# Patient Record
Sex: Female | Born: 1975 | Race: White | Hispanic: No | Marital: Married | Smoking: Never smoker
Health system: Southern US, Community
[De-identification: ages and names within clinical notes are randomized; demographics above are authoritative.]

## PROBLEM LIST (undated history)

## (undated) ENCOUNTER — Emergency Department (HOSPITAL_COMMUNITY): Admission: EM | Discharge status: Against Medical Advice | Payer: Self-pay

## (undated) DIAGNOSIS — I639 Cerebral infarction, unspecified: Secondary | ICD-10-CM

## (undated) DIAGNOSIS — J339 Nasal polyp, unspecified: Secondary | ICD-10-CM

## (undated) DIAGNOSIS — M792 Neuralgia and neuritis, unspecified: Secondary | ICD-10-CM

## (undated) DIAGNOSIS — R112 Nausea with vomiting, unspecified: Secondary | ICD-10-CM

## (undated) DIAGNOSIS — M1990 Unspecified osteoarthritis, unspecified site: Secondary | ICD-10-CM

## (undated) DIAGNOSIS — Z9889 Other specified postprocedural states: Secondary | ICD-10-CM

## (undated) DIAGNOSIS — K8020 Calculus of gallbladder without cholecystitis without obstruction: Secondary | ICD-10-CM

## (undated) DIAGNOSIS — K900 Celiac disease: Secondary | ICD-10-CM

## (undated) DIAGNOSIS — K219 Gastro-esophageal reflux disease without esophagitis: Secondary | ICD-10-CM

## (undated) DIAGNOSIS — G43909 Migraine, unspecified, not intractable, without status migrainosus: Secondary | ICD-10-CM

## (undated) DIAGNOSIS — M797 Fibromyalgia: Secondary | ICD-10-CM

## (undated) DIAGNOSIS — Q211 Atrial septal defect: Secondary | ICD-10-CM

## (undated) DIAGNOSIS — J302 Other seasonal allergic rhinitis: Secondary | ICD-10-CM

## (undated) HISTORY — PX: KNEE SURGERY: SHX244

## (undated) HISTORY — DX: Nasal polyp, unspecified: J33.9

## (undated) HISTORY — DX: Migraine, unspecified, not intractable, without status migrainosus: G43.909

## (undated) HISTORY — PX: WISDOM TOOTH EXTRACTION: SHX21

## (undated) HISTORY — DX: Atrial septal defect: Q21.1

## (undated) HISTORY — DX: Cerebral infarction, unspecified: I63.9

---

## 2000-11-18 ENCOUNTER — Encounter: Payer: Self-pay | Admitting: Emergency Medicine

## 2000-11-18 ENCOUNTER — Emergency Department (HOSPITAL_COMMUNITY): Admission: EM | Admit: 2000-11-18 | Discharge: 2000-11-18 | Payer: Self-pay | Admitting: Emergency Medicine

## 2001-02-27 ENCOUNTER — Encounter (INDEPENDENT_AMBULATORY_CARE_PROVIDER_SITE_OTHER): Payer: Self-pay | Admitting: Specialist

## 2001-02-27 ENCOUNTER — Inpatient Hospital Stay (HOSPITAL_COMMUNITY): Admission: AD | Admit: 2001-02-27 | Discharge: 2001-02-27 | Payer: Self-pay | Admitting: Gynecology

## 2001-02-27 ENCOUNTER — Encounter: Payer: Self-pay | Admitting: Gynecology

## 2001-10-04 ENCOUNTER — Encounter: Payer: Self-pay | Admitting: Obstetrics & Gynecology

## 2001-10-04 ENCOUNTER — Ambulatory Visit (HOSPITAL_COMMUNITY): Admission: RE | Admit: 2001-10-04 | Discharge: 2001-10-04 | Payer: Self-pay | Admitting: Obstetrics & Gynecology

## 2001-11-29 ENCOUNTER — Inpatient Hospital Stay (HOSPITAL_COMMUNITY): Admission: AD | Admit: 2001-11-29 | Discharge: 2001-11-29 | Payer: Self-pay | Admitting: Obstetrics & Gynecology

## 2001-12-13 ENCOUNTER — Ambulatory Visit (HOSPITAL_COMMUNITY): Admission: RE | Admit: 2001-12-13 | Discharge: 2001-12-13 | Payer: Self-pay | Admitting: Obstetrics & Gynecology

## 2001-12-13 ENCOUNTER — Encounter: Payer: Self-pay | Admitting: Obstetrics & Gynecology

## 2002-01-03 ENCOUNTER — Inpatient Hospital Stay (HOSPITAL_COMMUNITY): Admission: AD | Admit: 2002-01-03 | Discharge: 2002-01-09 | Payer: Self-pay | Admitting: Obstetrics & Gynecology

## 2002-02-20 ENCOUNTER — Inpatient Hospital Stay (HOSPITAL_COMMUNITY): Admission: AD | Admit: 2002-02-20 | Discharge: 2002-02-23 | Payer: Self-pay | Admitting: Obstetrics & Gynecology

## 2003-10-02 ENCOUNTER — Inpatient Hospital Stay (HOSPITAL_COMMUNITY): Admission: AD | Admit: 2003-10-02 | Discharge: 2003-10-02 | Payer: Self-pay | Admitting: Obstetrics

## 2003-12-13 ENCOUNTER — Inpatient Hospital Stay (HOSPITAL_COMMUNITY): Admission: AD | Admit: 2003-12-13 | Discharge: 2003-12-16 | Payer: Self-pay | Admitting: Obstetrics & Gynecology

## 2004-04-13 ENCOUNTER — Emergency Department (HOSPITAL_COMMUNITY): Admission: EM | Admit: 2004-04-13 | Discharge: 2004-04-14 | Payer: Self-pay | Admitting: Emergency Medicine

## 2005-12-26 ENCOUNTER — Emergency Department (HOSPITAL_COMMUNITY): Admission: EM | Admit: 2005-12-26 | Discharge: 2005-12-26 | Payer: Self-pay | Admitting: Emergency Medicine

## 2006-02-02 DIAGNOSIS — J189 Pneumonia, unspecified organism: Secondary | ICD-10-CM

## 2006-02-02 HISTORY — DX: Pneumonia, unspecified organism: J18.9

## 2006-03-24 ENCOUNTER — Inpatient Hospital Stay (HOSPITAL_COMMUNITY): Admission: AD | Admit: 2006-03-24 | Discharge: 2006-03-24 | Payer: Self-pay | Admitting: Obstetrics & Gynecology

## 2006-04-19 ENCOUNTER — Inpatient Hospital Stay (HOSPITAL_COMMUNITY): Admission: AD | Admit: 2006-04-19 | Discharge: 2006-04-22 | Payer: Self-pay | Admitting: Obstetrics

## 2006-05-17 ENCOUNTER — Inpatient Hospital Stay (HOSPITAL_COMMUNITY): Admission: AD | Admit: 2006-05-17 | Discharge: 2006-05-17 | Payer: Self-pay | Admitting: Obstetrics & Gynecology

## 2006-06-02 ENCOUNTER — Inpatient Hospital Stay (HOSPITAL_COMMUNITY): Admission: RE | Admit: 2006-06-02 | Discharge: 2006-06-04 | Payer: Self-pay | Admitting: Obstetrics & Gynecology

## 2006-06-07 ENCOUNTER — Encounter: Admission: RE | Admit: 2006-06-07 | Discharge: 2006-07-07 | Payer: Self-pay | Admitting: Obstetrics & Gynecology

## 2006-07-08 ENCOUNTER — Encounter: Admission: RE | Admit: 2006-07-08 | Discharge: 2006-08-06 | Payer: Self-pay | Admitting: Obstetrics & Gynecology

## 2006-08-07 ENCOUNTER — Encounter: Admission: RE | Admit: 2006-08-07 | Discharge: 2006-08-20 | Payer: Self-pay | Admitting: Obstetrics & Gynecology

## 2006-12-17 ENCOUNTER — Emergency Department (HOSPITAL_COMMUNITY): Admission: EM | Admit: 2006-12-17 | Discharge: 2006-12-17 | Payer: Self-pay | Admitting: Emergency Medicine

## 2007-01-04 ENCOUNTER — Ambulatory Visit (HOSPITAL_COMMUNITY): Admission: RE | Admit: 2007-01-04 | Discharge: 2007-01-04 | Payer: Self-pay | Admitting: Obstetrics & Gynecology

## 2008-06-13 ENCOUNTER — Ambulatory Visit (HOSPITAL_COMMUNITY): Admission: RE | Admit: 2008-06-13 | Discharge: 2008-06-13 | Payer: Self-pay | Admitting: Obstetrics & Gynecology

## 2008-06-21 ENCOUNTER — Ambulatory Visit (HOSPITAL_COMMUNITY): Admission: RE | Admit: 2008-06-21 | Discharge: 2008-06-21 | Payer: Self-pay | Admitting: Obstetrics & Gynecology

## 2008-06-21 ENCOUNTER — Encounter: Payer: Self-pay | Admitting: Obstetrics & Gynecology

## 2009-03-04 ENCOUNTER — Encounter: Admission: RE | Admit: 2009-03-04 | Discharge: 2009-03-04 | Payer: Self-pay | Admitting: Family Medicine

## 2009-04-02 DIAGNOSIS — I639 Cerebral infarction, unspecified: Secondary | ICD-10-CM

## 2009-04-02 DIAGNOSIS — Q211 Atrial septal defect: Secondary | ICD-10-CM

## 2009-04-02 DIAGNOSIS — Q2112 Patent foramen ovale: Secondary | ICD-10-CM

## 2009-04-02 HISTORY — DX: Atrial septal defect: Q21.1

## 2009-04-02 HISTORY — DX: Patent foramen ovale: Q21.12

## 2009-04-02 HISTORY — DX: Cerebral infarction, unspecified: I63.9

## 2009-04-14 ENCOUNTER — Inpatient Hospital Stay (HOSPITAL_COMMUNITY): Admission: EM | Admit: 2009-04-14 | Discharge: 2009-04-16 | Payer: Self-pay | Admitting: Emergency Medicine

## 2009-04-15 ENCOUNTER — Ambulatory Visit: Payer: Self-pay | Admitting: Vascular Surgery

## 2009-04-15 ENCOUNTER — Encounter (INDEPENDENT_AMBULATORY_CARE_PROVIDER_SITE_OTHER): Payer: Self-pay | Admitting: Family Medicine

## 2009-04-24 ENCOUNTER — Ambulatory Visit: Payer: Self-pay | Admitting: Cardiology

## 2009-04-24 ENCOUNTER — Observation Stay (HOSPITAL_COMMUNITY): Admission: EM | Admit: 2009-04-24 | Discharge: 2009-04-25 | Payer: Self-pay | Admitting: Emergency Medicine

## 2009-04-25 ENCOUNTER — Encounter (INDEPENDENT_AMBULATORY_CARE_PROVIDER_SITE_OTHER): Payer: Self-pay | Admitting: Family Medicine

## 2009-05-02 ENCOUNTER — Encounter: Payer: Self-pay | Admitting: Cardiovascular Disease

## 2009-05-02 DIAGNOSIS — Z87898 Personal history of other specified conditions: Secondary | ICD-10-CM

## 2009-05-02 DIAGNOSIS — G43909 Migraine, unspecified, not intractable, without status migrainosus: Secondary | ICD-10-CM | POA: Insufficient documentation

## 2009-05-02 DIAGNOSIS — E786 Lipoprotein deficiency: Secondary | ICD-10-CM | POA: Insufficient documentation

## 2009-05-02 DIAGNOSIS — Z8673 Personal history of transient ischemic attack (TIA), and cerebral infarction without residual deficits: Secondary | ICD-10-CM | POA: Insufficient documentation

## 2009-05-02 DIAGNOSIS — I6350 Cerebral infarction due to unspecified occlusion or stenosis of unspecified cerebral artery: Secondary | ICD-10-CM

## 2009-05-03 ENCOUNTER — Ambulatory Visit: Payer: Self-pay | Admitting: Cardiovascular Disease

## 2009-05-03 DIAGNOSIS — Q211 Atrial septal defect: Secondary | ICD-10-CM | POA: Insufficient documentation

## 2009-08-11 ENCOUNTER — Inpatient Hospital Stay (HOSPITAL_COMMUNITY): Admission: EM | Admit: 2009-08-11 | Discharge: 2009-08-13 | Payer: Self-pay | Admitting: Emergency Medicine

## 2009-08-26 ENCOUNTER — Ambulatory Visit: Payer: Self-pay | Admitting: Cardiovascular Disease

## 2009-08-27 ENCOUNTER — Emergency Department (HOSPITAL_COMMUNITY): Admission: EM | Admit: 2009-08-27 | Discharge: 2009-08-27 | Payer: Self-pay | Admitting: Emergency Medicine

## 2009-09-02 ENCOUNTER — Ambulatory Visit: Payer: Self-pay | Admitting: Cardiovascular Disease

## 2009-09-02 HISTORY — PX: PATENT FORAMEN OVALE(PFO) CLOSURE: CATH118300

## 2009-09-02 LAB — CONVERTED CEMR LAB
BUN: 13 mg/dL (ref 6–23)
Basophils Absolute: 0.1 10*3/uL (ref 0.0–0.1)
CO2: 28 meq/L (ref 19–32)
Chloride: 105 meq/L (ref 96–112)
Glucose, Bld: 74 mg/dL (ref 70–99)
INR: 1 (ref 0.8–1.0)
Lymphocytes Relative: 35.6 % (ref 12.0–46.0)
MCHC: 34.2 g/dL (ref 30.0–36.0)
MCV: 92 fL (ref 78.0–100.0)
Neutro Abs: 3.4 10*3/uL (ref 1.4–7.7)
Neutrophils Relative %: 53.3 % (ref 43.0–77.0)
Platelets: 259 10*3/uL (ref 150.0–400.0)
Potassium: 4 meq/L (ref 3.5–5.1)
RDW: 13.1 % (ref 11.5–14.6)
Sodium: 139 meq/L (ref 135–145)
aPTT: 30.3 s — ABNORMAL HIGH (ref 21.7–28.8)

## 2009-09-04 ENCOUNTER — Ambulatory Visit: Payer: Self-pay | Admitting: Cardiovascular Disease

## 2009-09-04 ENCOUNTER — Inpatient Hospital Stay (HOSPITAL_COMMUNITY): Admission: RE | Admit: 2009-09-04 | Discharge: 2009-09-06 | Payer: Self-pay | Admitting: Cardiovascular Disease

## 2009-09-05 ENCOUNTER — Encounter: Payer: Self-pay | Admitting: Cardiovascular Disease

## 2009-09-09 ENCOUNTER — Telehealth: Payer: Self-pay | Admitting: Cardiovascular Disease

## 2009-09-12 ENCOUNTER — Ambulatory Visit: Payer: Self-pay | Admitting: Cardiovascular Disease

## 2009-10-03 ENCOUNTER — Ambulatory Visit: Payer: Self-pay | Admitting: Cardiovascular Disease

## 2009-10-03 ENCOUNTER — Ambulatory Visit (HOSPITAL_COMMUNITY): Admission: RE | Admit: 2009-10-03 | Discharge: 2009-10-03 | Payer: Self-pay | Admitting: Cardiovascular Disease

## 2009-10-03 ENCOUNTER — Ambulatory Visit: Payer: Self-pay

## 2009-10-03 ENCOUNTER — Ambulatory Visit: Payer: Self-pay | Admitting: Cardiology

## 2009-11-05 ENCOUNTER — Encounter: Payer: Self-pay | Admitting: Cardiovascular Disease

## 2009-11-14 ENCOUNTER — Telehealth: Payer: Self-pay | Admitting: Cardiovascular Disease

## 2009-12-05 ENCOUNTER — Encounter: Payer: Self-pay | Admitting: Cardiovascular Disease

## 2009-12-05 ENCOUNTER — Ambulatory Visit: Payer: Self-pay | Admitting: Cardiovascular Disease

## 2010-03-04 NOTE — Letter (Signed)
Summary: ASD CLOSURE  [facility], Main Office  [address]   [place], [place] [address]   Phone: [phone]  Fax: [phone]     08/26/2009 MRN: [id number]  Tonya Andrews [address] [place], [place]  [address]  Dear Ms. Freeze,   You are scheduled for PFO Closure on Wednesday September 04, 2009              with Dr. Excell Seltzer.  Please arrive at the [facility] at 6:30       a.m. on the day of your procedure.  1. DIET     _X___ Nothing to eat or drink after midnight except your medications with a sip of water.  DRINK PLENTY OF WATER THE DAY PRIOR TO PROCEDURE.  2. Come to the [facility] office on Monday September 02, 2009 for lab work.  The lab at [facility] is open from 8:30 a.m. to 2:00 p.m. and 2:30 p.m. to 5:00 p.m.  You do not have to be fasting.  3. MAKE SURE YOU TAKE YOUR PLAVIX.    4. __X__ YOU MAY TAKE ALL of your remaining medications with a small amount of water.      5. Plan for one night stay - bring personal belongings (i.e. toothpaste, toothbrush, etc.)  6. Bring a current list of your medications and current insurance cards.  7. Must have a responsible person to drive you home.   8. Someone must be with you for the first 24 hours after you arrive home.  9. Please wear clothes that are easy to get on and off and wear slip-on shoes.  *Special note: Every effort is made to have your procedure done on time.  Occasionally there are emergencies that present themselves at the hospital that may cause delays.  Please be patient if a delay does occur.  If you have any questions after you get home, please call the office at the number listed above.  Julieta Gutting, RN, BSN

## 2010-03-04 NOTE — Letter (Signed)
Summary: Trip Cancellation Claim Form  Trip Cancellation Claim Form   Imported By: Roderic Ovens 05/14/2009 14:26:16  _____________________________________________________________________  External Attachment:    Type:   Image     Comment:   External Document

## 2010-03-04 NOTE — Discharge Summary (Signed)
  [name], Tonya Andrews                 ACCOUNT NO.:  [id number]  MEDICAL RECORD NO.:  [id number]          PATIENT TYPE:  INP  LOCATION:  2507                         FACILITY:  [facility]  PHYSICIAN:  Veverly Fells. Excell Seltzer, MD  DATE OF BIRTH:  April 08, 1975  DATE OF ADMISSION:  09/04/2009 DATE OF DISCHARGE:  09/06/2009                              DISCHARGE SUMMARY  ADDENDUM: The patient was kept an additional night because of postural tachycardia.  She developed dizziness and significant tachycardia with heart rates in the 120s to 130s with standing the day following transcatheter closure of an atrioseptal communication.  She was given additional intravenous fluids overnight and her symptoms did not fully resolve, but they improved by the morning of August 5.  Her echocardiogram was reviewed and it showed no significant abnormalities. The closure device was in appropriate position.  There was no pericardial effusion.  The patient's orthostatic vital signs on the morning of August 5 were as follows:  Supine heart rate 80, blood pressure 101/63 sitting, heart rate 85, blood pressure 101/61, standing heart rate 96, blood pressure 98/70 and 3 minutes standing, heart rate 104, blood pressure 100/68.  OTHER INSTRUCTIONS:  As per discharge summary dictated August 4.  The patient will be given a prescription for metoprolol 25 mg twice daily as needed for palpitations or chest pain.  She also has developed some rash related to tape exposure and she can take Benadryl as needed for this. I will add her on for followup visit and to be seen next week in the office.     Veverly Fells. Excell Seltzer, MD    MDC/MEDQ  D:  09/06/2009  T:  09/06/2009  Job:  [id number]  cc:   Emeterio Reeve, MD  Electronically Signed by Tonny Bollman MD on 03/04/2010 04:42:21 AM

## 2010-03-04 NOTE — Progress Notes (Signed)
Summary: pt needs referral to rehab  Phone Note From Other Clinic Call back at [phone]   [facility] Reason for Call: Need Referral Information Summary of Call: pt is scheduled for appt on 8/9 @ 9am because she told them Dr. Copper told her to find a place she wanted to go to and he would do a referral for her. they need to hear about this today because pt is due to be seen in the am. Initial call taken by: Omer Jack,  September 09, 2009 10:25 AM  Follow-up for Phone Call        I spoke with Deanna and she needs an order from Dr Excell Seltzer for PT/OT of right arm and hand. Order placed and faxed to [phone]. Follow-up by: Julieta Gutting, RN, BSN,  September 09, 2009 1:42 PM

## 2010-03-04 NOTE — Assessment & Plan Note (Signed)
Summary: rov   Visit Type:  Post-hospital Referring Provider:  Dr Pearlean Brownie Primary Provider:  Dr Paulino Rily  CC:  Stroke.  History of Present Illness: 35 year-old presenting for hospital follow-up evaluation after sustaining a second stroke this year. She was found to have an atrial septal communication after a stroke sustained this Spring. She was evaluated and we decide to treat her medically with aspirin at the time. Unfortunately, she had a second event 2 weeks ago when she developed sudden onset of right arm and hand weakness and tingling. She subsequently developed headache and blurred vision. She has had more frequent episodes since the event. She also reports an episode of right leg tingling and numbness yesterday, but no other symptoms were associated and this has now resolved.   She was evaluated by neurology during her hospitalization, and transcatheter closure of the atrial septal communication was recommended since the event occurred on a background of aspirin. She was also started on clopidogrel at that time and she is tolerating this without side-effects.  She complains of palpitations and associated chest pain, but this is nonexertional. She underwent cardiac cath earlier this year demonstrating no CAD.  Current Medications (verified): 1)  Plavix 75 Mg Tabs (Clopidogrel Bisulfate) .... Take One Tablet By Mouth Daily 2)  Fish Oil 1000 Mg Caps (Omega-3 Fatty Acids) .... Take 1 Capsule By Mouth Once A Day 3)  Topiramate 25 Mg Tabs (Topiramate) .... Take 1 Tablet By Mouth Once A Day 4)  Prilosec 20 Mg Cpdr (Omeprazole) .... Take 1 Capsule By Mouth Once A Day  Allergies: 1)  ! * Pitosin  Past History:  Past medical history reviewed for relevance to current acute and chronic problems.  Past Medical History: Stroke x 2 in 2011 Migraine headaches Atrial septal communication diagnosed by TEE 2011  Review of Systems       Negative except as per HPI   Vital Signs:  Patient  profile:   35 year old female Height:      66 inches Weight:      146 pounds BMI:     23.65 Pulse rate:   76 / minute Pulse rhythm:   regular Resp:     18 per minute BP sitting:   100 / 70  (left arm) Cuff size:   large  Vitals Entered By: Vikki Ports (August 26, 2009 4:33 PM)  Physical Exam  General:  Pt is alert and oriented, in no acute distress. HEENT: normal Neck: normal carotid upstrokes without bruits, JVP normal Lungs: CTA CV: RRR without murmur or gallop Abd: soft, NT, positive BS, no bruit, no organomegaly Ext: no clubbing, cyanosis, or edema. peripheral pulses 2+ and equal Skin: warm and dry without rash    EKG  Procedure date:  08/26/2009  Findings:      NSR 76 bpm, within normal limits.  MRI Brain  Procedure date:  08/12/2009  Findings:       Findings:  Diffusion imaging shows a 2-3 mm acute infarction in the   left parietal subcortical white matter.  No other acute or subacute   insult.  The areas of acute infarction affecting the right caudate   and posterior frontal region that were demonstrated in March are   barely discernible on today's scan, there being very minimal   abnormal T2 signal in the right caudate.  The brain otherwise   appears normal.  No large vessel territory infarction.  No mass   lesion, hemorrhage, hydrocephalus or extra-axial collection.  No   pituitary mass.  No inflammatory sinus disease.    IMPRESSION:   2-3 mm acute infarction in the left parietal subcortical white   matter.  Impression & Recommendations:  Problem # 1:  ATRIAL SEPTAL DEFECT (ICD-67.72) 35 year-old woman, now with recurrent stroke on a background of antiplatelet therapy. She has an atrial septal communication with documented right-to-left shunt by TEE. She also has a mild residual deficit involving her right hand and positive MRI findings of acute cerebral infarctions now on two separate occasions, both associated with acute neurologic events.  I think she  clearly meets indications for transcatheter closure of the defect. I have reviewed risks, indications, and alternatives with the patient and her husband in detail. I explained the procedural aspects at length. They understand and agree to proceed.   The pt is currently taking clopidogrel and I asked her to discontinue omeprazole because of the potential drug-drug interaction. She will be started on ASA 81 mg prior to the procedure.  Other Orders: Cardiac Catheterization (Cardiac Cath)  Patient Instructions: 1)  Stop Prilosec. 2)  You are being scheduled for transcatheter closure of an atrial septal communication. 3)  Your physician recommends that you schedule a follow-up appointment in: 3 months.

## 2010-03-04 NOTE — Assessment & Plan Note (Signed)
Summary: NEW PT EVAL   Visit Type:  Initial Consult Referring Provider:  Dr Pearlean Brownie Primary Provider:  Dr Paulino Rily  CC:  New evaluation- PFO.  History of Present Illness: 35 year-old woman presents for evaluation of atrial septal communication after a stroke in March 2011. She has had a longstanding of migraine headaches, but woke up the morning of her stroke with a headache, different from her typical migraine. At church, she developed sudden onset of expressive aphasia and left arm weakness/clumsiness. She went to the [facility] Emergency Dept and was ultimately diagnosed with a stroke by MRI. Symptoms had begun to resolve by the time she arrived at the emergency dept. Complains of residual daily headaches since the stroke but no residual weakness or clumsiness.  She has had several migraines per month before beginning daily prophylactic meds, but meds have helped significantly.  She had been started on OCP's 2 months prior to stroke with additional estrogen to help with irregular menses. This was discontinued at the time of her hospital admission.  The patient was hospitalized last wk for chest pain, and underwent cardiac catheterization showing no significant CAD. She also underwent a TEE during admission demonstrating a PFO with right-to-left shunt by bubble study.    Current Medications (verified): 1)  Aspirin 81 Mg Tbec (Aspirin) .... Take One Tablet By Mouth Daily 2)  Fish Oil 1000 Mg Caps (Omega-3 Fatty Acids) .... Take 1 Capsule By Mouth Once A Day 3)  Topiramate 25 Mg Tabs (Topiramate) .... Take 1 Tablet By Mouth Once A Day 4)  Prilosec 20 Mg Cpdr (Omeprazole) .... Take 1 Capsule By Mouth Once A Day 5)  Fruity Chewables Multivitamin  Chew (Pediatric Multiple Vit-C-Fa) .... Once Daily  Allergies (verified): 1)  ! * Pitosin  Past History:  Past medical history reviewed for relevance to current acute and chronic problems.  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 [employer] No tobacco or Etoh  Review of Systems       Negative except as per HPI   Vital Signs:  Patient profile:   35 year old female Height:      66 inches Weight:      149.50 pounds BMI:     24.22 Pulse rate:   75 / minute Pulse rhythm:   regular Resp:     18 per minute BP sitting:   106 / 74  (left arm) Cuff size:   large  Vitals Entered By: Vikki Ports (May 03, 2009 1:57 PM)  Physical Exam  General:  Pt is well-developed, healthy appearing young woman, alert and oriented, no acute distress HEENT: normal Neck: no thyromegaly           JVP normal, carotid upstrokes normal without bruits Lungs: CTA Chest: equal expansion  CV: Apical impulse nondisplaced, RRR without murmur or gallop Abd: soft, NT, positive BS, no HSM, no bruit Back: no CVA tenderness Ext: no clubbing, cyanosis, or edema        femoral pulses 2+ without bruits        pedal pulses 2+ and equal Skin: warm, dry, no rash Neuro: CNII-XII intact,strength 5/5 = b/l    Cardiac Cath  Procedure date:  04/25/2009  Findings:       Left main was normal.  LAD was a large vessel gave off a small diagonal branch proximally and   then trifurcated at the distal portion.  It was angiographically normal.      Left circumflex gave off a moderate size ramus branch and an OM-1 that   was angiographically normal.      Right coronary artery was a large dominant vessel gave off an RV branch   and acute marginal PDA and two posterolaterals.  It was angiographically   normal.      Left ventriculogram done in the RAO position showed an ejection fraction   of 50%-55%.  There are no regional wall motion abnormalities.      TE Echocardiogram  Procedure date:   04/25/2009  Findings:       Left ventricle: Systolic function was normal. The estimated ejection fraction was in the range of 50% to 55%.  Aortic valve: No evidence of vegetation.  Mitral valve: No evidence of vegetation.  Right atrium: No evidence of thrombus in the atrial cavity or appendage.  Atrial septum: There was a small patent foramen ovale. There was a shunt detected by bubble study through a patent foramen ovale, following an increase in RA pressure induced by the Valsalva maneuver.  Tricuspid valve: No evidence of vegetation.  Pulmonic valve: No evidence of vegetation.  MRI EXAM  Procedure date:  04/14/2009  Findings:      IMPRESSION:   Acute non hemorrhagic infarct involving the right caudate head and   body extending to the peripheral aspect of the mid anterior right   frontal lobe.    Minimal nonspecific white matter type changes left frontal lobe as   discussed above.    Paranasal sinus mucosal thickening.    Decreased signal intensity bone marrow as noted above.  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 This occurred in the setting of oral contraceptives and documented atrial septal communication with right-to-left shunt. She has no other risk factors for stroke, and I strongly suspect the mechanism of her TIA was paradoxical embolus. I had a lengthy discussion with the patient and her husband today regarding the relationship between PFO and cryptogenic stoke/TIA. We reviewed the presumed mechanism, the TEE results, and the clinical questions regarding transcatheter closure versus medical therapy. We discussed the Respect PFO Trial, and I encouraged her to consider this trial considering the lack of RCT data in this situation. She would like to consider her treatment options further at this time, and whe will discuss the trial further with the  [facility] group and Dr Pearlean Brownie. In the meantime, she will remain on daily ASA, and will avoid oral contraceptives or other potentially thrombogenic medications in the future.  The pt will call in the future depending on her treatment decision.   Orders: EKG w/ Interpretation (93000)  Patient Instructions: 1)  Your physician recommends that you schedule a follow-up appointment as needed.  2)  Your physician recommends that you continue on your current medications as directed. Please refer to the Current Medication list given to you today.

## 2010-03-04 NOTE — Assessment & Plan Note (Signed)
Summary: EPH   Visit Type:  Post hospital Referring Aleshia Cartelli:  Dr Pearlean Brownie Primary Janaisa Birkland:  Dr Paulino Rily  CC:  Dizziness and chest pains.  History of Present Illness: 35 year-old woman presenting for hospital follow-up evaluation after undergoing transcatheter closure of an atrial septal communication after sustaining a second stroke. The procedure was uncomplicated, but she developed postural tachycardia and dizziness following closure of her defect. An echo showed appropriate position of the device and no pericardial effusion.  She continues to complain of palpitations, dizziness, and atypical chest pains. She had chest pain that felt like 'reflux' following the procedure but this resolved with antacid therapy.  Current Medications (verified): 1)  Plavix 75 Mg Tabs (Clopidogrel Bisulfate) .... Take One Tablet By Mouth Daily 2)  Fish Oil 1000 Mg Caps (Omega-3 Fatty Acids) .... Take 1 Capsule By Mouth Once A Day 3)  Topiramate 25 Mg Tabs (Topiramate) .... Take 1 Tablet By Mouth Once A Day 4)  Metoprolol Tartrate 25 Mg Tabs (Metoprolol Tartrate) .... Take One Tablet By Mouth Twice A Day As Needed For Palpitations 5)  Pepcid 20 Mg Tabs (Famotidine) .... Take 1 Tablet By Mouth Two Times A Day 6)  Aspirin 81 Mg Tbec (Aspirin) .... Take One Tablet By Mouth Daily 7)  Tylenol Extra Strength 500 Mg Tabs (Acetaminophen) .... As Needed  Allergies: 1)  ! * Pitosin  Past History:  Past medical history reviewed for relevance to current acute and chronic problems.  Past Medical History: Reviewed history from 08/26/2009 and no changes required. Stroke x 2 in 2011 Migraine headaches Atrial septal communication diagnosed by TEE 2011  Vital Signs:  Patient profile:   35 year old female Height:      66 inches Weight:      147.25 pounds BMI:     23.85 Pulse rate:   71 / minute Pulse (ortho):   83 / minute Pulse rhythm:   regular Resp:     18 per minute BP sitting:   104 / 81  (left arm) Cuff  size:   regular  Vitals Entered By: Vikki Ports (September 12, 2009 12:13 PM)  Serial Vital Signs/Assessments:  Time      Position  BP       Pulse  Resp  Temp     By 12:22 PM  Lying [place]  101/68   70                    Luz Jaramillo 12:23 PM  Sitting   104/71   81                    Luz Jaramillo 12:24 PM  Standing  92/62    83                    Luz Jaramillo 12:26 PM  Standing  95/66    95                    Vikki Ports 12:29 PM  Standing  96/64    83                    Luz Jaramillo  Comments: 12:29 PM Lying - Dizzy Sitting- Dizzy Standing- Dizzy Standing 2 minutes- Dizzy Standing 3 minutes- Dizzy By: Vikki Ports    Physical Exam  General:  Pt is alert and oriented, young woman in no acute distress. HEENT: normal Neck: normal carotid upstrokes without bruits,  JVP normal Lungs: CTA CV: RRR without murmur or gallop Abd: soft, NT, positive BS, no bruit, no organomegaly Ext: no clubbing, cyanosis, or edema. peripheral pulses 2+ and equal Skin: warm and dry without rash    EKG  Procedure date:  09/12/2009  Findings:      NSR, within normal limits 71 bpm  Impression & Recommendations:  Problem # 1:  ATRIAL SEPTAL DEFECT (ICD-745.5) Pt is s/p transcatheter closure of her atrial septal communication, now with complaints as noted in the HPI. She is on appropriate Rx with ASA and plavix and should continue this for at least 6 months. Will repeat an echo at one month. Recommend change beta-blocker to long-acting metoprolol succinate for a more consisent effect. It is possible that she has a nickel allergy and we discussed this today...she has multiple food allergies. In reviewing the literature, pt's with nickel allergies have more chest pain and palps following device closure. Will see back in followup in a few weeks.  Orders: EKG w/ Interpretation (93000) Echocardiogram (Echo)  Patient Instructions: 1)  Your physician recommends that you schedule a follow-up  appointment in: 3 WEEKS 2)  Your physician has requested that you have an echocardiogram in 3 WEEKS.  Echocardiography is a painless test that uses sound waves to create images of your heart. It provides your doctor with information about the size and shape of your heart and how well your heart's chambers and valves are working.  This procedure takes approximately one hour. There are no restrictions for this procedure. 3)  Your physician has recommended you make the following change in your medication: START Metoprolol Succ 25mg  one tablet at bedtime 4)  Push salt and fluids. Prescriptions: METOPROLOL SUCCINATE 25 MG XR24H-TAB (METOPROLOL SUCCINATE) Take one tablet by mouth daily  #30 x 2   Entered by:   Julieta Gutting, RN, BSN   Authorized by:   Norva Karvonen, MD   Signed by:   Julieta Gutting, RN, BSN on 09/12/2009   Method used:   Electronically to        [facility] [phone]* (retail)       [address]       [place], [place]  [address]       Ph: [phone] or [phone]       Fax: [phone]   RxID:   [id number]

## 2010-03-04 NOTE — Assessment & Plan Note (Signed)
Summary: 3wk f/u   Visit Type:  3 weeks follow up Referring Provider:  Dr Pearlean Brownie Primary Provider:  Dr Paulino Rily  CC:  Patient still having chest pains- follow echo done today.  History of Present Illness: 35 year-old presenting for followup evaluation after transcatheter closure of an atrial septal communication. She has a history of two prior TIA's, both with MRI findings of small cerebral infarcts. She was started on long-acting metoprolol at the time of her last visit because of continued problems with postural tachycardia and chest pain. Her symptoms have improved, now complaining only of occasional chest pain, much less than in the past. No dyspnea, palps, dizziness, or other complaints.  Current Medications (verified): 1)  Plavix 75 Mg Tabs (Clopidogrel Bisulfate) .... Take One Tablet By Mouth Daily 2)  Fish Oil 1000 Mg Caps (Omega-3 Fatty Acids) .... Take 1 Capsule By Mouth Once A Day 3)  Topiramate 25 Mg Tabs (Topiramate) .... Take 1 Tablet By Mouth Once A Day 4)  Pepcid 20 Mg Tabs (Famotidine) .... Take 1 Tablet By Mouth Two Times A Day 5)  Aspirin 81 Mg Tbec (Aspirin) .... Take One Tablet By Mouth Daily 6)  Tylenol Extra Strength 500 Mg Tabs (Acetaminophen) .... As Needed 7)  Metoprolol Succinate 25 Mg Xr24h-Tab (Metoprolol Succinate) .... Take One Tablet By Mouth Daily  Allergies: 1)  ! * Pitosin  Past History:  Past medical history reviewed for relevance to current acute and chronic problems.  Past Medical History: Reviewed history from 08/26/2009 and no changes required. Stroke x 2 in 2011 Migraine headaches Atrial septal communication diagnosed by TEE 2011  Review of Systems       Negative except as per HPI   Vital Signs:  Patient profile:   35 year old female Height:      66 inches Weight:      147.75 pounds BMI:     23.93 Pulse rate:   74 / minute Pulse rhythm:   regular Resp:     18 per minute BP sitting:   110 / 78  (left arm) Cuff size:    large  Vitals Entered By: Vikki Ports (October 03, 2009 2:14 PM)  Physical Exam  General:  Pt is alert and oriented, young woman in no acute distress. HEENT: normal Neck: normal carotid upstrokes without bruits, JVP normal Lungs: CTA CV: RRR without murmur or gallop Abd: soft, NT, positive BS, no bruit, no organomegaly Ext: no clubbing, cyanosis, or edema. peripheral pulses 2+ and equal Skin: warm and dry without rash    EKG  Procedure date:  10/03/2009  Findings:      NSR, within normal limits, 74 bpm  Impression & Recommendations:  Problem # 1:  ATRIAL SEPTAL DEFECT (ICD-745.5) Pt improving. I reviewed her echo images from today's study. The closure device looks appropriately seated and there is no color flow across the device. LV function appears to be at the lower limits of normal. She is going to have a follow up transcranial doppler in November and I will see her back after that study is completed. She will continue ASA and plavix for a total duration of 6 months, then long-term low-dose ASA. She can resume activities without restrictions now that she is one month out from device placement. I advised to reduce metoprolol succinate to 12.5 mg at HS for one week, then discontinue.  Other Orders: EKG w/ Interpretation (93000)  Patient Instructions: 1)  Your physician recommends that you keep your scheduled  follow-up appointment in November. 2)  Your physician has recommended you make the following change in your medication: Decrease Toprol XL to 25mg  take one-half tablet daily for 7 days, then discontinue

## 2010-03-04 NOTE — Progress Notes (Signed)
Summary: refill med  Phone Note Refill Request Call back at Home Phone [phone] Call back at [phone] Message from:  Patient on November 14, 2009 10:12 AM  Refills Requested: Medication #1:  PLAVIX 75 MG TABS Take one tablet by mouth daily [facility] [address]   Method Requested: Fax to Local Pharmacy Initial call taken by: Lorne Skeens,  November 14, 2009 10:12 AM  Follow-up for Phone Call        Rx faxed to pharmacy. Vikki Ports  November 14, 2009 4:19 PM     Prescriptions: PLAVIX 75 MG TABS (CLOPIDOGREL BISULFATE) Take one tablet by mouth daily  #30 x 11   Entered by:   Vikki Ports   Authorized by:   Norva Karvonen, MD   Signed by:   Vikki Ports on 11/14/2009   Method used:   Faxed to ...       [facility] [address] (retail)       [address]       [place], [place]  [address]       Ph: [phone] or [phone]       Fax: [phone]   RxID:   [id number]

## 2010-03-04 NOTE — Letter (Signed)
[facility]   Imported By: Marylou Mccoy 11/22/2009 08:52:27  _____________________________________________________________________  External Attachment:    Type:   Image     Comment:   External Document

## 2010-03-04 NOTE — Assessment & Plan Note (Signed)
Summary: f40m   Visit Type:  3 months follow up Referring Provider:  Dr Pearlean Brownie Primary Provider:  Dr Paulino Rily  CC:  chest pains.  History of Present Illness: 35 year-old presenting for followup evaluation after transcatheter closure of an atrial septal communication September 04, 2009. She has a history of two prior TIA's, both with MRI findings of small cerebral infarcts. She had postural dizziness and tachycardia following device closure. This resolved over approximately one month.   No doing well. No further palps or lightheadedness. No dyspnea, edema. Occasional resting chest pain but no exertional symptoms.  Current Medications (verified): 1)  Plavix 75 Mg Tabs (Clopidogrel Bisulfate) .... Take One Tablet By Mouth Daily 2)  Fish Oil 1000 Mg Caps (Omega-3 Fatty Acids) .... Take 1 Capsule By Mouth Once A Day 3)  Topiramate 25 Mg Tabs (Topiramate) .... Take 1 Tablet By Mouth Two Times A Day 4)  Aspirin 81 Mg Tbec (Aspirin) .... Take One Tablet By Mouth Daily 5)  Tylenol Extra Strength 500 Mg Tabs (Acetaminophen) .... As Needed  Allergies: 1)  ! * Pitosin  Past History:  Past medical history reviewed for relevance to current acute and chronic problems.  Past Medical History: Reviewed history from 08/26/2009 and no changes required. Stroke x 2 in 2011 Migraine headaches Atrial septal communication diagnosed by TEE 2011  Review of Systems       Positive for migraine headaches, otherwise negative except as per HPI.  Vital Signs:  Patient profile:   35 year old female Height:      66 inches Weight:      146.50 pounds BMI:     23.73 Pulse rate:   76 / minute Pulse rhythm:   regular Resp:     18 per minute BP sitting:   114 / 74  (left arm) Cuff size:   regular  Vitals Entered By: Vikki Ports (December 05, 2009 9:57 AM)  Physical Exam  General:  Pt is alert and oriented, young woman in no acute distress. HEENT: normal Neck: normal carotid upstrokes without bruits, JVP  normal Lungs: CTA CV: RRR without murmur or gallop Abd: soft, NT, positive BS, no bruit, no organomegaly Ext: no clubbing, cyanosis, or edema. peripheral pulses 2+ and equal Skin: warm and dry without rash    EKG  Procedure date:  12/05/2009  Findings:      NSR 76 bpm, within normal limits.  Impression & Recommendations:  Problem # 1:  ATRIAL SEPTAL DEFECT (ICD-745.5) Pt stable after device closure of her atrial septal communication. She had followup TCD showing minimal shunt (improved from pre-closure). Recommend continue ASA and plavix for total duration 6 months, then stop plavix and continue with low-dose ASA 81 mg. Continue SBE prophylaxis for 6 months total (thru Mar 07, 2010). Followup echo and office visit August 2012.  Other Orders: EKG w/ Interpretation (93000)  Patient Instructions: 1)  Your physician recommends that you schedule a follow-up appointment in: August 2012 2)  Your physician recommends that you continue on your current medications as directed. Please refer to the Current Medication list given to you today.  YOU MAY STOP YOUR PLAVIX ON 03/07/2010!! 3)  Your physician has requested that you have an echocardiogram in August 2012.  Echocardiography is a painless test that uses sound waves to create images of your heart. It provides your doctor with information about the size and shape of your heart and how well your heart's chambers and valves are working.  This procedure  takes approximately one hour. There are no restrictions for this procedure.

## 2010-04-18 LAB — PREGNANCY, URINE: Preg Test, Ur: NEGATIVE

## 2010-04-18 LAB — BASIC METABOLIC PANEL
CO2: 26 mEq/L (ref 19–32)
Calcium: 8.9 mg/dL (ref 8.4–10.5)
Chloride: 110 mEq/L (ref 96–112)
GFR calc Af Amer: >60 mL/min (ref >60)
GFR calc non Af Amer: >60 mL/min (ref >60)
Potassium: 4.1 mEq/L (ref 3.5–5.1)
Sodium: 140 mEq/L (ref 135–145)

## 2010-04-18 LAB — CBC
HCT: 35.1 % — ABNORMAL LOW (ref 36.0–46.0)
MCH: 30.8 pg (ref 26.0–34.0)
MCV: 90 fL (ref 78.0–100.0)
WBC: 7.1 10*3/uL (ref 4.0–10.5)

## 2010-04-19 LAB — POCT PREGNANCY, URINE: Preg Test, Ur: NEGATIVE

## 2010-04-20 LAB — COMPREHENSIVE METABOLIC PANEL
ALT: 16 U/L (ref 0–35)
Albumin: 3.8 g/dL (ref 3.5–5.2)
Alkaline Phosphatase: 43 U/L (ref 39–117)
BUN: 12 mg/dL (ref 6–23)
Chloride: 108 mEq/L (ref 96–112)
Glucose, Bld: 110 mg/dL — ABNORMAL HIGH (ref 70–99)
Potassium: 3.3 mEq/L — ABNORMAL LOW (ref 3.5–5.1)
Sodium: 138 mEq/L (ref 135–145)
Total Bilirubin: 0.4 mg/dL (ref 0.3–1.2)

## 2010-04-20 LAB — CARDIAC PANEL(CRET KIN+CKTOT+MB+TROPI)
CK, MB: 0.8 ng/mL (ref 0.3–4.0)
Relative Index: INVALID (ref 0.0–2.5)
Troponin I: <0.01 ng/mL (ref 0.00–0.06)

## 2010-04-20 LAB — BASIC METABOLIC PANEL
BUN: 13 mg/dL (ref 6–23)
Calcium: 8.9 mg/dL (ref 8.4–10.5)
Creatinine, Ser: 0.86 mg/dL (ref 0.4–1.2)
GFR calc non Af Amer: >60 mL/min (ref >60)
Glucose, Bld: 84 mg/dL (ref 70–99)
Potassium: 4.1 mEq/L (ref 3.5–5.1)

## 2010-04-20 LAB — APTT: aPTT: 27 seconds (ref 24–37)

## 2010-04-20 LAB — DIFFERENTIAL
Basophils Absolute: 0.1 10*3/uL (ref 0.0–0.1)
Basophils Relative: 1 % (ref 0–1)
Eosinophils Absolute: 0.1 10*3/uL (ref 0.0–0.7)
Monocytes Absolute: 0.6 10*3/uL (ref 0.1–1.0)
Neutro Abs: 4.7 10*3/uL (ref 1.7–7.7)
Neutrophils Relative %: 50 % (ref 43–77)

## 2010-04-20 LAB — HEMOGLOBIN A1C
Hgb A1c MFr Bld: 5 % (ref <5.7)
Mean Plasma Glucose: 97 mg/dL (ref <117)

## 2010-04-20 LAB — CK TOTAL AND CKMB (NOT AT ARMC)
CK, MB: 1 ng/mL (ref 0.3–4.0)
Relative Index: INVALID (ref 0.0–2.5)
Total CK: 82 U/L (ref 7–177)

## 2010-04-20 LAB — LIPID PANEL: Cholesterol: 138 mg/dL (ref 0–200)

## 2010-04-20 LAB — CBC: MCV: 93.3 fL (ref 78.0–100.0)

## 2010-04-27 LAB — BASIC METABOLIC PANEL
BUN: 8 mg/dL (ref 6–23)
Calcium: 8.4 mg/dL (ref 8.4–10.5)
GFR calc non Af Amer: >60 mL/min (ref >60)
Glucose, Bld: 100 mg/dL — ABNORMAL HIGH (ref 70–99)
Sodium: 142 mEq/L (ref 135–145)

## 2010-04-27 LAB — COMPREHENSIVE METABOLIC PANEL
ALT: 30 U/L (ref 0–35)
AST: 18 U/L (ref 0–37)
Albumin: 3.6 g/dL (ref 3.5–5.2)
Calcium: 8.8 mg/dL (ref 8.4–10.5)
Creatinine, Ser: 0.82 mg/dL (ref 0.4–1.2)
GFR calc Af Amer: >60 mL/min (ref >60)
Sodium: 139 mEq/L (ref 135–145)

## 2010-04-27 LAB — CARDIAC PANEL(CRET KIN+CKTOT+MB+TROPI)
CK, MB: 0.7 ng/mL (ref 0.3–4.0)
Relative Index: INVALID (ref 0.0–2.5)
Total CK: 58 U/L (ref 7–177)

## 2010-04-27 LAB — CBC
HCT: 40.3 % (ref 36.0–46.0)
MCV: 92.5 fL (ref 78.0–100.0)
Platelets: 259 10*3/uL (ref 150–400)
Platelets: 291 10*3/uL (ref 150–400)
RBC: 4.35 MIL/uL (ref 3.87–5.11)
RDW: 12.7 % (ref 11.5–15.5)
WBC: 5.5 10*3/uL (ref 4.0–10.5)
WBC: 7.4 10*3/uL (ref 4.0–10.5)

## 2010-04-27 LAB — DIFFERENTIAL
Eosinophils Absolute: 0.1 10*3/uL (ref 0.0–0.7)
Eosinophils Relative: 1 % (ref 0–5)
Lymphocytes Relative: 40 % (ref 12–46)
Lymphs Abs: 2.2 10*3/uL (ref 0.7–4.0)
Monocytes Relative: 7 % (ref 3–12)

## 2010-04-27 LAB — LIPID PANEL
Cholesterol: 120 mg/dL (ref 0–200)
Total CHOL/HDL Ratio: 3.6 RATIO
VLDL: 17 mg/dL (ref 0–40)

## 2010-04-27 LAB — URINALYSIS, ROUTINE W REFLEX MICROSCOPIC
Glucose, UA: NEGATIVE mg/dL
Hgb urine dipstick: NEGATIVE
Specific Gravity, Urine: 1.01 (ref 1.005–1.030)
pH: 7 (ref 5.0–8.0)

## 2010-04-27 LAB — RAPID URINE DRUG SCREEN, HOSP PERFORMED
Cocaine: NOT DETECTED
Tetrahydrocannabinol: NOT DETECTED

## 2010-04-27 LAB — LUPUS ANTICOAGULANT PANEL: DRVVT: 33 secs — ABNORMAL LOW (ref 36.2–44.3)

## 2010-04-27 LAB — CARDIOLIPIN ANTIBODIES, IGG, IGM, IGA
Anticardiolipin IgA: <3 APL U/mL — ABNORMAL LOW (ref <10)
Anticardiolipin IgG: <3 GPL U/mL — ABNORMAL LOW (ref <10)
Anticardiolipin IgM: <3 MPL U/mL — ABNORMAL LOW (ref <10)

## 2010-04-27 LAB — PROTEIN C ACTIVITY: Protein C Activity: 192 % — ABNORMAL HIGH (ref 75–133)

## 2010-04-27 LAB — POCT I-STAT, CHEM 8
BUN: 11 mg/dL (ref 6–23)
Creatinine, Ser: 0.9 mg/dL (ref 0.4–1.2)
Glucose, Bld: 94 mg/dL (ref 70–99)
Hemoglobin: 13.9 g/dL (ref 12.0–15.0)
Potassium: 3.8 mEq/L (ref 3.5–5.1)
Sodium: 141 mEq/L (ref 135–145)
TCO2: 23 mmol/L (ref 0–100)

## 2010-04-27 LAB — PROTHROMBIN GENE MUTATION

## 2010-04-27 LAB — ANTITHROMBIN III: AntiThromb III Func: 104 % (ref 76–126)

## 2010-04-27 LAB — POCT CARDIAC MARKERS
Myoglobin, poc: 65.8 ng/mL (ref 12–200)
Troponin i, poc: <0.05 ng/mL (ref 0.00–0.09)

## 2010-04-27 LAB — HEMOGLOBIN A1C
Hgb A1c MFr Bld: 5.6 % (ref 4.6–6.1)
Mean Plasma Glucose: 114 mg/dL

## 2010-04-27 LAB — BETA-2-GLYCOPROTEIN I ABS, IGG/M/A: Beta-2 Glyco I IgG: <3 U/mL (ref <=15)

## 2010-04-27 LAB — MAGNESIUM: Magnesium: 2 mg/dL (ref 1.5–2.5)

## 2010-04-27 LAB — APTT: aPTT: 27 seconds (ref 24–37)

## 2010-04-27 LAB — ANA: Anti Nuclear Antibody(ANA): NEGATIVE

## 2010-04-27 LAB — PHOSPHORUS: Phosphorus: 3 mg/dL (ref 2.3–4.6)

## 2010-04-27 LAB — PROTEIN S, TOTAL: Protein S Ag, Total: 87 % (ref 70–140)

## 2010-04-28 LAB — DIFFERENTIAL
Eosinophils Absolute: 0.1 10*3/uL (ref 0.0–0.7)
Lymphs Abs: 2.8 10*3/uL (ref 0.7–4.0)
Neutrophils Relative %: 40 % — ABNORMAL LOW (ref 43–77)

## 2010-04-28 LAB — CARDIAC PANEL(CRET KIN+CKTOT+MB+TROPI)
CK, MB: 0.4 ng/mL (ref 0.3–4.0)
Total CK: 47 U/L (ref 7–177)
Total CK: 57 U/L (ref 7–177)

## 2010-04-28 LAB — COMPREHENSIVE METABOLIC PANEL
ALT: 22 U/L (ref 0–35)
BUN: 8 mg/dL (ref 6–23)
CO2: 25 mEq/L (ref 19–32)
Calcium: 9.2 mg/dL (ref 8.4–10.5)
Creatinine, Ser: 0.79 mg/dL (ref 0.4–1.2)
GFR calc non Af Amer: >60 mL/min (ref >60)
Glucose, Bld: 85 mg/dL (ref 70–99)
Sodium: 138 mEq/L (ref 135–145)

## 2010-04-28 LAB — CBC
MCHC: 33.8 g/dL (ref 30.0–36.0)
MCV: 92.1 fL (ref 78.0–100.0)
Platelets: 247 10*3/uL (ref 150–400)
RDW: 12.9 % (ref 11.5–15.5)

## 2010-04-28 LAB — POCT PREGNANCY, URINE: Preg Test, Ur: NEGATIVE

## 2010-04-28 LAB — CK TOTAL AND CKMB (NOT AT ARMC): Relative Index: INVALID (ref 0.0–2.5)

## 2010-04-28 LAB — TROPONIN I: Troponin I: 0.01 ng/mL (ref 0.00–0.06)

## 2010-05-13 LAB — CBC
HCT: 38.4 % (ref 36.0–46.0)
Hemoglobin: 13.5 g/dL (ref 12.0–15.0)
MCHC: 35.2 g/dL (ref 30.0–36.0)
RDW: 13.4 % (ref 11.5–15.5)

## 2010-05-13 LAB — RH IMMUNE GLOBULIN WORKUP (NOT WOMEN'S HOSP): Antibody Screen: NEGATIVE

## 2010-06-17 NOTE — Discharge Summary (Signed)
[name], Tonya Andrews                 ACCOUNT NO.:  [id number]   MEDICAL RECORD NO.:  [id number]          PATIENT TYPE:  INP   LOCATION:  9152                          [facility]ILITY:  [facility]   PHYSICIAN:  Roseanna Rainbow, M.D.DATE OF BIRTH:  January 15, 1976   DATE OF ADMISSION:  04/19/2006  DATE OF DISCHARGE:  04/22/2006                               DISCHARGE SUMMARY   CHIEF COMPLAINT:  The patient is a 35 year old, P2 with an estimated  date of confinement of May 10, with an intrauterine pregnancy at 32-2/7  weeks complaining of contractions and pelvic pressure.  Please see the  dictated history and physical for further details.   HOSPITAL COURSE:  The patient was admitted.  She received magnesium  sulfate, tocolysis and betamethasone.  She was placed started on Advair.  She was placed on bed rest.  She had adequate tocolysis and a urinalysis  was remarkable for 1+ glucose and 15 ketones and specific gravity was  1.025.  RPR is nonreactive.  An obstetrical ultrasound on March 17,  demonstrated pregnancy at 32 weeks.  Estimated fetal weight percentile  at 72nd percentile.  Normal amniotic fluid index.  The cervix measured  3.6 cm translabially.  She was subsequently discharged to go home on  March 20.   DISCHARGE DIAGNOSIS:  Intrauterine pregnancy at 32 plus weeks with  preterm contractions.   CONDITION ON DISCHARGE:  Stable.   DIET:  Regular.   ACTIVITY:  Bed rest, pelvic rest.   DISCHARGE MEDICATIONS:  Resume previous medications.   DISPOSITION:  The patient has a previous appointment in the office.      Roseanna Rainbow, M.D.  Electronically Signed     LAJ/MEDQ  D:  07/09/2006  T:  07/09/2006  Job:  [id number]

## 2010-06-17 NOTE — Op Note (Signed)
NAMEADYLYNN, Tonya Andrews                 ACCOUNT NO.:  [id number]   MEDICAL RECORD NO.:  [id number]          PATIENT TYPE:  AMB   LOCATION:  SDC                           [facility]   PHYSICIAN:  Roseanna Rainbow, M.D.DATE OF BIRTH:  February 17, 1975   DATE OF PROCEDURE:  06/21/2008  DATE OF DISCHARGE:  06/21/2008                               OPERATIVE REPORT   PREOPERATIVE DIAGNOSIS:  Embryonic demise.   POSTOPERATIVE DIAGNOSIS:  Embryonic demise.   PROCEDURE:  Suction, dilatation and curettage.   SURGEON:  Roseanna Rainbow, MD.   ANESTHESIA:  Managed anesthesia care, paracervical block.   PATHOLOGY:  Products of conception.   ESTIMATED BLOOD LOSS:  Minimal.   COMPLICATIONS:  None.   DESCRIPTION OF PROCEDURE:  The patient was taken to the operating room  with an IV running.  She was placed in the dorsal lithotomy position and  prepped and draped in the usual sterile fashion.  After time-out had  been completed, a sterile speculum was placed in the patient's vagina.  The cervix appeared closed.  The anterior lip of the cervix was then  infiltrated with 2 mL of 1% Xylocaine.  The single-tooth tenaculum was  then applied to this location.  A 4 mL of 1% Xylocaine were then  infiltrated at 4 and 7 o'clock to produce a paracervical block.  The  cervix was then dilated with [facility] dilators.  A 7-mm suction curette was  then advanced into the uterus.  The sac was ruptured.  The curette was  then activated and rotated to evacuate the products of conception.  A  sharp curettage was then performed and a gritty texture was noted.  A  final pass was made with the suction curette.  The single-tooth  tenaculum was then removed with minimal bleeding noted from the cervix.  At the close of the procedure the instrument, pack counts were said to  be correct x2.  The patient was taken to the PACU, awake and in stable  condition.      Roseanna Rainbow, M.D.  Electronically  Signed     LAJ/MEDQ  D:  06/21/2008  T:  06/22/2008  Job:  [id number]

## 2010-06-20 NOTE — Discharge Summary (Signed)
   NAME:  ORA, BOLLIG                           ACCOUNT NO.:  [id number]   MEDICAL RECORD NO.:  [id number]                   PATIENT TYPE:  INP   LOCATION:  9159                                 [facility]   PHYSICIAN:  Roseanna Rainbow, M.D.         DATE OF BIRTH:  May 09, 1975   DATE OF ADMISSION:  01/03/2002  DATE OF DISCHARGE:  01/09/2002                                 DISCHARGE SUMMARY   CHIEF COMPLAINT:  The patient is a 35 year old gravida 2 para 0 with an  intrauterine pregnancy at 67 and two-sevenths weeks; EDC of March 04, 2002; who has preterm premature cervical dilatation and effacement.  Please  see the dictated History and Physical for further details.   HOSPITAL COURSE:  The patient was admitted and she was started on magnesium  sulfate for tocolysis, and bedrest.  A one-hour glucose challenge test was  elevated at 153 prior to administration of steroids.  She was also started  on Unasyn as well.  On hospital day #5 she developed some nausea, vomiting,  and diarrhea which was felt to be consistent with a viral syndrome.  On  hospital day #6 the magnesium sulfate was discontinued and she was changed  to Procardia 10 mg p.o. q.6h.  She remained adequately tocolyzed on this  regimen.  Sterile vaginal exam on the day of discharge was 2 cm dilated and  50% effaced.   DISCHARGE DIAGNOSES:  1. Intrauterine pregnancy at 32 weeks with preterm labor.  2. Elevated one-hour Glucola.   CONDITION:  Stable.   DIET:  A 2200 calorie ADA.   ACTIVITY:  Bedrest with bedside commode.   MEDICATIONS:  Procardia XL 60 mg.   DISPOSITION:  The patient was to follow up in the office in several days for  follow-up.                                               Roseanna Rainbow, M.D.    Judee Clara  D:  02/10/2002  T:  02/11/2002  Job:  [id number]

## 2010-06-20 NOTE — H&P (Signed)
NAME:  Tonya Andrews, Tonya Andrews                           ACCOUNT NO.:  [id number]   MEDICAL RECORD NO.:  [id number]                   PATIENT TYPE:  INP   LOCATION:  9159                                 [facility]   PHYSICIAN:  Roseanna Rainbow, M.D.         DATE OF BIRTH:  1975-02-19   DATE OF ADMISSION:  01/03/2002  DATE OF DISCHARGE:                                HISTORY & PHYSICAL   HISTORY:  The patient is a 35 year old gravida 2, para 0 with an  intrauterine pregnancy at 91 and 2/7 weeks, EDC of March 04, 2002.  Has  three term premature cervical dilatation and effacement.   HISTORY OF PRESENT ILLNESS:  As above.  The patient presented on November 11  with increased pelvic pressure and pain with activity.  Pelvic examination  at that point was fingertip __________  and 50% effaced.  Ultrasound by me  at that point demonstrated a cervical length of 1.8 cm.  An ultrasound  performed at [facility] demonstrated a cervical length of 2.2 cm.  Also, of note, the estimated fetal weight was  greater than the 95th  percentile for 28 weeks.  All the biometry measurements were symmetric.  The  patient has been taken out of work and placed on bed rest at home and her  cervix remained stable at the following visit on November 20.  The patient  continued to complain of pressure.  Tocodynamometer in the office  demonstrated minimal uterine irritability.  When she presented on December 1  she had minimal urinary tract infection symptoms.  A wet prep demonstrated  bacterial vaginosis and urinalysis, however, was not consistent with an  infection.  However, examination by the nurse midwife, Marlinda Mike,  demonstrated an increased lower uterine segment, the cervix 1-2 cm dilated,  80% with the vertex at a 0 station.  Again, tocodynamometer no significant  contractions.  A fetal fibronectin was sent as well and this was positive.  However, this result was possibly compounded by the  bacterial vaginosis.  Other antepartum problems or risks, the patient is O negative and received  RhoGAM at 28 weeks.  Her antibody screen was negative.  Her Pap smear  demonstrated atypical cells.  HPV was not detected and again, as above, a  recent ultrasound with possibly accelerated fetal growth.   PAST OB/GYN HISTORY:  She is status post a spontaneous first trimester  abortion.   PAST MEDICAL HISTORY:  Migraine headaches.   PAST SURGICAL HISTORY:  Oral surgery.   FAMILY HISTORY:  Noncontributory.   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  She denies any tobacco,  ethanol, or substance abuse.   MEDICATIONS:  Prenatal vitamins.   ALLERGIES:  No known drug allergies.   PHYSICAL EXAMINATION:  GENERAL:  Well-developed, well-nourished, no apparent  distress.  VITAL SIGNS:  Blood pressure 110/78.  ABDOMEN:  Gravid, nontender.  Fundal height  31 on December 1 as per the  certified nurse midwife.  PELVIC:  Again, as per the certified nurse midwife on December 1.  Increased  lower uterine segment development.  Cervix 1-2 cm dilated, 80% effaced,  vertex at a 0 station and ballottable.  EXTREMITIES:  No edema.   ASSESSMENT:  Intrauterine pregnancy at 64 and 1/7 weeks with premature  cervical dilatation and effacement.  Also the fetus with possible  accelerated growth.   PLAN:  Admission.  Decrease physical activity with a bed side commode.  Steroids for fetal lung maturity.  Repeat the one hour Glucola prior to the  administration of the steroids.  Will check the urine culture and  sensitivity.  Will administer broad spectrum antibiotics.  Possible  tocolysis for uterine contractions.                                               Roseanna Rainbow, M.D.    Judee Clara  D:  01/03/2002  T:  01/03/2002  Job:  [id number]

## 2010-06-20 NOTE — H&P (Signed)
Tonya Andrews, Tonya Andrews                 ACCOUNT NO.:  [id number]   MEDICAL RECORD NO.:  [id number]          PATIENT TYPE:  INP   LOCATION:  9162                          [facility]   PHYSICIAN:  Roseanna Rainbow, M.D.DATE OF BIRTH:  1975-12-20   DATE OF ADMISSION:  04/19/2006  DATE OF DISCHARGE:                              HISTORY & PHYSICAL   CHIEF COMPLAINT:  The patient is a 35 year old para 2 with an estimated  date of confinement of Jun 12, 2006 with an intrauterine pregnancy at  32.2 weeks complaining of contractions and pressure.   HISTORY OF PRESENT ILLNESS:  The patient reports the onset of these  symptoms for several days.  She describes pink discharge this morning.  She denies recent coitus or rupture of membranes.  She also denies any  dysuria.  She has a history of threatened preterm labor with her  previous pregnancies.   ALLERGIES:  ADVERSE REACTION TO PROCARDIA.   MEDICATIONS:  Please see the reconciliation sheet.   RISK FACTORS:  She is Rh negative, nonsensitized.   PRENATAL LABORATORIES:  Blood type is O negative, antibody screen  negative.  Chlamydia DNA probe negative.  Urine culture and  sensitivities September 25th insignificant growth.  GC probe negative.  One-hour GCT 145.  Hepatitis B surface antigen negative.  Hematocrit  41.8.  Hemoglobin 13.8.  HIV nonreactive.  Pap smear negative.  RPR  nonreactive.  Rubella immune.  Varicella immune.  Ultrasound on March 10, 2006 estimated fetal weight percentile 57th percentile.  Amniotic  fluid high normal with an AFI of 22.2, no previa.  An ultrasound on  January 11, 2006 at 18 weeks 2 days normal amniotic fluid, no previa,  normal level 2 anatomy.   PAST GYNECOLOGIC HISTORY:  Noncontributory.   PAST MEDICAL HISTORY:  No significant history of medical diseases.   PAST SURGICAL HISTORY:  No previous surgeries.   SOCIAL HISTORY:  She is a homemaker, married, living with her spouse,  does not give  any significant history of alcohol usage, has no  significant smoking history, denies illicit drug use.   FAMILY HISTORY:  Prostate cancer, hypertension, and heart disease.   PAST OBSTETRICAL HISTORY:  In November 2005 she was delivered of an 8  pound 4 ounce female, vaginal delivery, no complications.  In January  2004 she was delivered of a live born female full term 8 pounds 4  ounces, vaginal delivery, no complications.  In 2003 she had a first  trimester spontaneous abortion.   PHYSICAL EXAMINATION:  VITAL SIGNS:  Blood pressure 115/76, weight 200  pounds, fetal heart rate by Doppler 140s.  GENERAL:  No apparent distress.  ABDOMEN:  Gravid, nontender.  STERILE VAGINAL EXAM:  The cervix is 1 and 60% effaced with lower  uterine segment development noted.  EXTREMITIES:  Trace edema.   ASSESSMENT:  Intrauterine pregnancy at 32-plus weeks with threatened  preterm labor.   PLAN:  Admission, magnesium sulfate tocolysis, complete obstetrical  ultrasound, betamethasone, strict bed rest.  Will check a urinalysis and  urine culture and sensitivity.  Roseanna Rainbow, M.D.  Electronically Signed     LAJ/MEDQ  D:  04/19/2006  T:  04/19/2006  Job:  [id number]

## 2010-06-20 NOTE — H&P (Signed)
Tonya Andrews, Tonya Andrews                 ACCOUNT NO.:  [id number]   MEDICAL RECORD NO.:  [id number]          PATIENT TYPE:  INP   LOCATION:  9136                          [facility]   PHYSICIAN:  Roseanna Rainbow, M.D.DATE OF BIRTH:  1975/02/06   DATE OF ADMISSION:  12/13/2003  DATE OF DISCHARGE:                                HISTORY & PHYSICAL   CHIEF COMPLAINT:  The patient is a 35 year old para 1, with an estimated  date of confinement of December 18, 2003 with an intra-uterine pregnancy at  39-2/7 weeks complaining of leaking fluid.   HISTORY OF PRESENT ILLNESS:  See above.  The patient reports leaking clear  fluid for approximately one hour prior to presentation.  She denies any  contractions.  She reports good fetal movement.   PRENATAL COURSE PROBLEMS AND RISKS:  Prenatal care with [facility] with onset of  care at 11 weeks.  She is Rh negative and she has gestational diabetes, diet-  controlled.  An ultrasound at approximately 36 weeks at [facility] was consistent with an average estimated fetal weight, with a  slightly above average estimated fetal weight.   LABORATORIES:  Hemoglobin 13.9, platelets 358,000, blood type O negative, Rh  antibody negative, RPR nonreactive, Rubella immune, hepatitis B surface  antigen negative.   PAST OBSTETRIC AND GYNECOLOGIC HISTORY:  She has a history of a late first  trimester spontaneous abortion in 2004.  She had delivery of a female, 8  pounds 4 ounces at 38 weeks, spontaneous vaginal delivery complicated by  positive GBS.   PAST MEDICAL HISTORY:  Remarkable for migraine headaches.   PAST SURGICAL HISTORY:  Oral surgery.   FAMILY HISTORY:  Remarkable for chronic hypertension and heart disease.   SOCIAL HISTORY:  She is married.  She denies any tobacco, ethanol or  substance abuse.   ALLERGIES:  Procardia and possible sensitivity to RhoGAM.   MEDICATIONS:  Prenatal vitamins.   PHYSICAL EXAMINATION:  VITAL SIGNS:   Temperature 98, pulse 101, respirations  18, blood pressure 131/85.  Fetal heart tracing reassuring, tocodynamometer  with minimal uterine activity.  GENERAL:  Well-developed, well-nourished, no apparent distress.  ABDOMEN:  Gravid, nontender.  PELVIC:  Sterile vaginal exam as per the R.N.; 4 cm dilated, 90% effaced  with a bulging bag of waters.   ASSESSMENT:  Primipara at term with spontaneous rupture of membranes, fetal  heart tracing consistent with fetal well being.   PLAN:  Admission, expected management.  Possible augmentation with Pitocin.     Collier Flowers  D:  12/14/2003  T:  12/14/2003  Job:  [id number]

## 2010-06-20 NOTE — Discharge Summary (Signed)
NAME:  Tonya Andrews, Tonya Andrews                           ACCOUNT NO.:  [id number]   MEDICAL RECORD NO.:  [id number]                   PATIENT TYPE:  INP   LOCATION:  9143                                 FACILITY:  [facility]   PHYSICIAN:  Charles A. Clearance Coots, M.D.             DATE OF BIRTH:  May 22, 1975   DATE OF ADMISSION:  02/20/2002  DATE OF DISCHARGE:  02/23/2002                                 DISCHARGE SUMMARY   ADMITTING DIAGNOSES:  1. A 38-week gestation.  2. Active labor.   DISCHARGE DIAGNOSES:  1. A 38-week gestation.  2. Active labor.  3. Status post normal spontaneous vaginal delivery, viable female infant on     February 21, 2002 at 0435.  Apgars of 9 at one minute, 9 at five minutes.     Weight 3765 g.  Length of 53 cm.  Mother and infant discharged home in     good condition.   REASON FOR ADMISSION:  A 35 year old white female G2, P0, estimated date of  confinement of March 04, 2002 presented at [redacted] weeks gestation with  spontaneous rupture of membranes, strong uterine contractions.   PAST SURGICAL HISTORY:  None.   PAST MEDICAL HISTORY:  Migraine headaches   MEDICATIONS:  Prenatal vitamins.   ALLERGIES:  Questionable allergy to PENICILLIN, rash.   PAST OB HISTORY:  One spontaneous abortion.   CURRENT OB HISTORY:  Prenatal care at the [facility] with Tonya Andrews, M.D.  Onset of care at 6-[redacted] weeks gestation.  Prenatal care  was benign.   SOCIAL HISTORY:  Married.  She is a Runner, broadcasting/film/video.  Negative history of tobacco,  alcohol, or drugs.   PHYSICAL EXAMINATION:  GENERAL:  Well-nourished, well-developed white female  in no acute distress.  VITAL SIGNS:  Temperature 99.4, pulse 94, blood pressure 139/71.  LUNGS:  Clear to auscultation bilaterally.  HEART:  Regular rate and rhythm.  ABDOMEN:  Soft, gravid, nontender.  PELVIC:  Cervix 6-7 cm dilated, 80% effaced, vertex at a -2 station.  Fetal  heart rate was 140 beats per minute.  Positive accelerations and  no  decelerations.  Uterine contractions every three to five minutes.   ADMITTING LABORATORY VALUES:  Hemoglobin 11.8, hematocrit 35.4, white blood  cell count 14,200, platelets 354,000.  Comprehensive metabolic panel was  within normal limits.  RPR was nonreactive.   HOSPITAL COURSE:  The patient was admitted and started on clindamycin for  group B Strep prophylaxis.  She progressed quite well to normal spontaneous  vaginal delivery on February 21, 2002 at 0435.  There were no intrapartum  complications.  Postpartum course was uncomplicated and patient was  discharged home in good condition on postpartum day number two.   DISCHARGE LABORATORY VALUES:  Hemoglobin 10.9, hematocrit 32.4, white blood  cell count 17,900, platelets 300,000.   DISCHARGE MEDICATIONS:  Continue prenatal vitamins.   DISCHARGE DISPOSITION:  Routine written  discharge instructions are given for  obstetrical discharge.  The patient is to follow up at the [facility] with Tonya Andrews, M.D. six weeks.                                               Charles A. Clearance Coots, M.D.    CAH/MEDQ  D:  02/23/2002  T:  02/23/2002  Job:  [id number]

## 2010-10-17 ENCOUNTER — Ambulatory Visit (INDEPENDENT_AMBULATORY_CARE_PROVIDER_SITE_OTHER): Payer: BC Managed Care – PPO | Admitting: Cardiovascular Disease

## 2010-10-17 ENCOUNTER — Encounter: Payer: Self-pay | Admitting: Cardiovascular Disease

## 2010-10-17 VITALS — BP 121/82 | HR 63 | Ht 66.0 in | Wt 146.8 lb

## 2010-10-17 DIAGNOSIS — Q211 Atrial septal defect: Secondary | ICD-10-CM

## 2010-10-17 DIAGNOSIS — Q2111 Secundum atrial septal defect: Secondary | ICD-10-CM

## 2010-10-17 DIAGNOSIS — Q2112 Patent foramen ovale: Secondary | ICD-10-CM

## 2010-10-17 NOTE — Patient Instructions (Signed)
Your physician wants you to follow-up in: 12 months.You will receive a reminder letter in the mail two months in advance. If you don't receive a letter, please call our office to schedule the follow-up appointment. Echo to be done on same day as office visit.   Your physician has requested that you have an echocardiogram. Echocardiography is a painless test that uses sound waves to create images of your heart. It provides your doctor with information about the size and shape of your heart and how well your heart's chambers and valves are working. This procedure takes approximately one hour. There are no restrictions for this procedure.

## 2010-10-17 NOTE — Assessment & Plan Note (Signed)
The patient is stable without any recurrent neurologic symptoms. She is due for her one-year echocardiogram and this will be scheduled. She should remain on aspirin 81 mg daily. I would like to see her back in 12 months with a followup echo at that time.

## 2010-10-17 NOTE — Progress Notes (Signed)
HPI:  35 year old woman presenting for follow up evaluation. The patient underwent transcatheter closure of a small atrial septal communication in August 2011. An Amplatzer device was used. She was treated because of recurrent TIA on a background of antiplatelet therapy. The patient is doing well at present. She denies chest pain, dyspnea, edema, or palpitations. She has occasional headaches but the pattern is stable. She is trying to reduce her Topamax dose. She has upcoming followup with Dr. Pearlean Brownie.  Outpatient Encounter Prescriptions as of 10/17/2010  Medication Sig Dispense Refill  . aspirin 81 MG tablet Take 81 mg by mouth daily.        Marland Kitchen Fexofenadine HCl (ALLEGRA PO) Take 1 tablet by mouth daily.        . fish oil-omega-3 fatty acids 1000 MG capsule Take 2 g by mouth daily.        Marland Kitchen topiramate (TOPAMAX) 25 MG capsule Take 25 mg by mouth 2 (two) times daily.          Allergies  Allergen Reactions  . Latex     Past Medical History  Diagnosis Date  . Stroke   . Migraine headache     ROS: Negative except as per HPI  BP 121/82  Pulse 63  Ht 5\' 6"  (1.676 m)  Wt 146 lb 12.8 oz (66.588 kg)  BMI 23.69 kg/m2  PHYSICAL EXAM: Pt is alert and oriented, NAD HEENT: normal Neck: JVP - normal, carotids 2+= without bruits Lungs: CTA bilaterally CV: RRR without murmur or gallop Abd: soft, NT, Positive BS, no hepatomegaly Ext: no C/C/E, distal pulses intact and equal Skin: warm/dry no rash  EKG:  Normal sinus rhythm 67 beats per minute, within normal limits.  ASSESSMENT AND PLAN:

## 2010-10-20 NOTE — Progress Notes (Signed)
Addended by: Erin Hearing on: 10/20/2010 02:25 PM   Modules accepted: Orders

## 2010-10-22 ENCOUNTER — Encounter: Payer: Self-pay | Admitting: *Deleted

## 2010-10-27 NOTE — Progress Notes (Signed)
Addended by: Burnett Kanaris A on: 10/27/2010 03:37 PM   Modules accepted: Orders

## 2010-10-31 ENCOUNTER — Ambulatory Visit (HOSPITAL_COMMUNITY): Payer: BC Managed Care – PPO | Attending: Cardiology | Admitting: Radiology

## 2010-10-31 DIAGNOSIS — Q211 Atrial septal defect: Secondary | ICD-10-CM

## 2010-10-31 DIAGNOSIS — I059 Rheumatic mitral valve disease, unspecified: Secondary | ICD-10-CM | POA: Insufficient documentation

## 2010-10-31 DIAGNOSIS — I079 Rheumatic tricuspid valve disease, unspecified: Secondary | ICD-10-CM | POA: Insufficient documentation

## 2010-10-31 DIAGNOSIS — Q2111 Secundum atrial septal defect: Secondary | ICD-10-CM | POA: Insufficient documentation

## 2010-11-11 LAB — BASIC METABOLIC PANEL
GFR calc Af Amer: >60
GFR calc non Af Amer: >60
Potassium: 3.1 — ABNORMAL LOW
Sodium: 138

## 2010-11-11 LAB — CBC
HCT: 41.6
Hemoglobin: 14.3
WBC: 12.8 — ABNORMAL HIGH

## 2010-11-11 LAB — DIFFERENTIAL
Eosinophils Relative: 1
Lymphocytes Relative: 27
Lymphs Abs: 3.4
Monocytes Absolute: 0.8

## 2010-11-11 LAB — D-DIMER, QUANTITATIVE: D-Dimer, Quant: 0.42

## 2011-04-27 IMAGING — CT CT HEAD W/O CM
1 series · 16 of 30 positions shown, 20 images · non-contrast
Comparison: 03/04/2009

CLINICAL DATA: Dizziness

CT HEAD WITHOUT CONTRAST
TECHNIQUE: Contiguous axial images were obtained from the base of
the skull through the vertex without contrast.

[Series 2: headseq 4.8 h45s · axial · 0.43mm/px · z∈[-193,-41]mm · 16 of 36 slices shown, 20 images]
[im 2/36  brain]
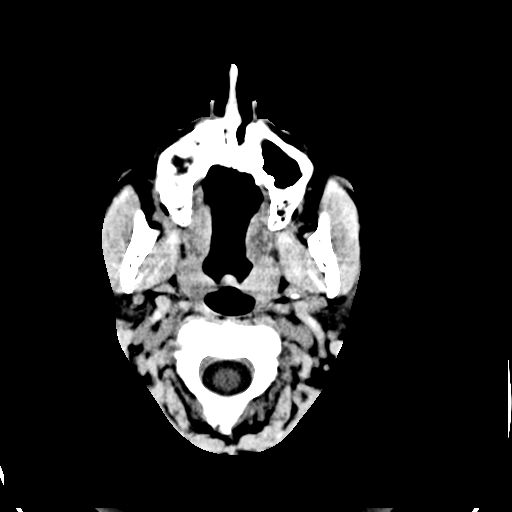
[im 2/36  bone]
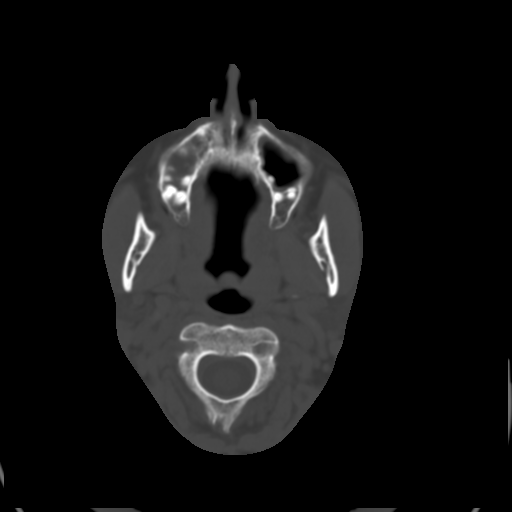
[im 4/36  brain]
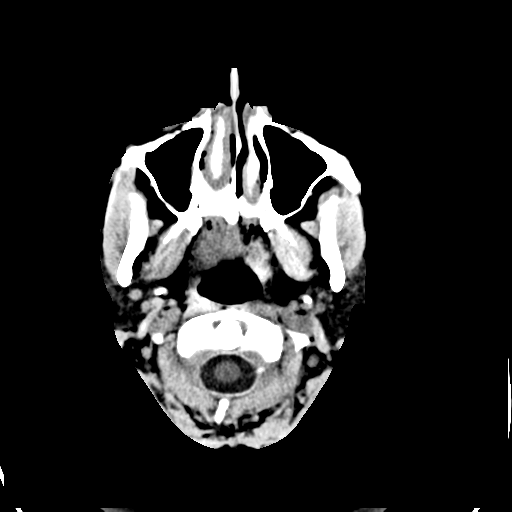
[im 7/36  brain]
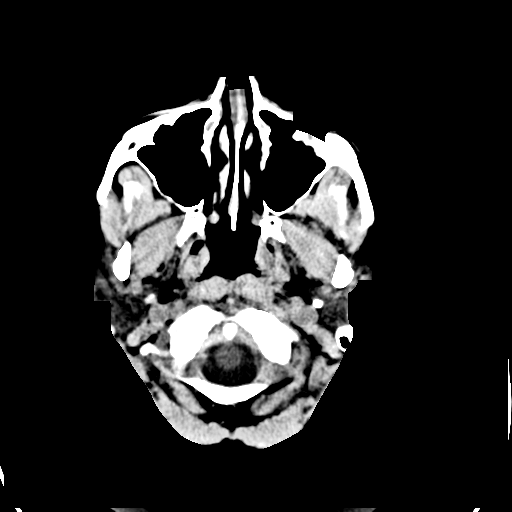
[im 9/36  brain]
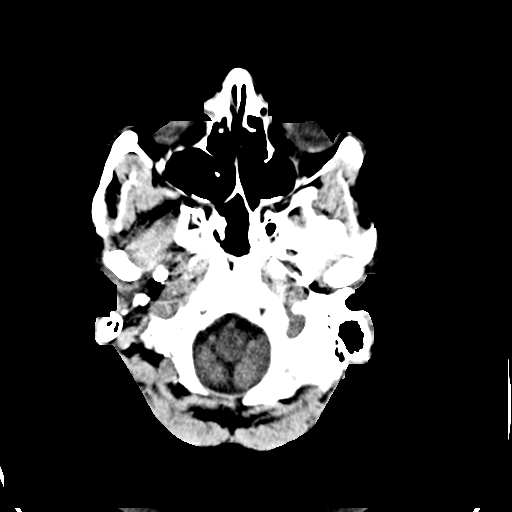
[im 10/36  brain]
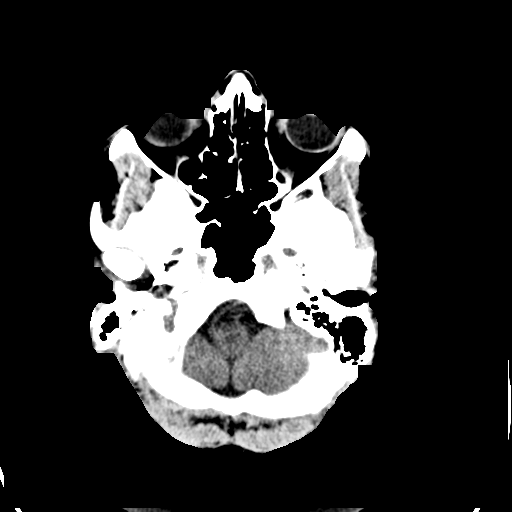
[im 10/36  bone]
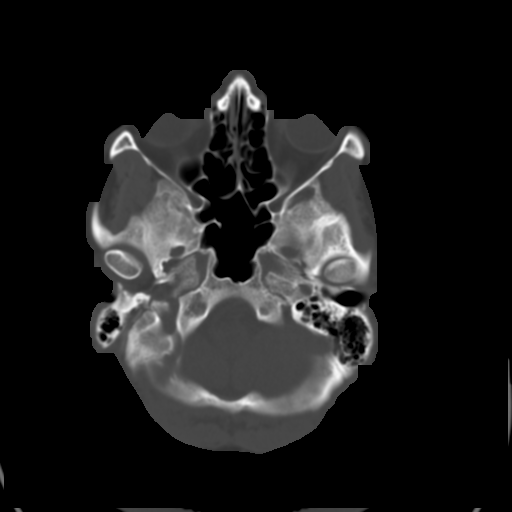
[im 13/36  brain]
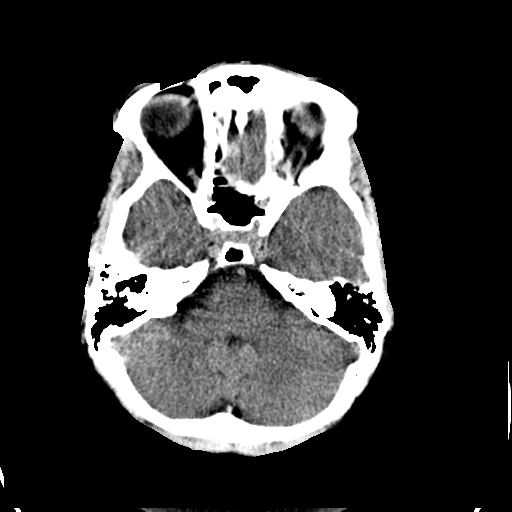
[im 15/36  brain]
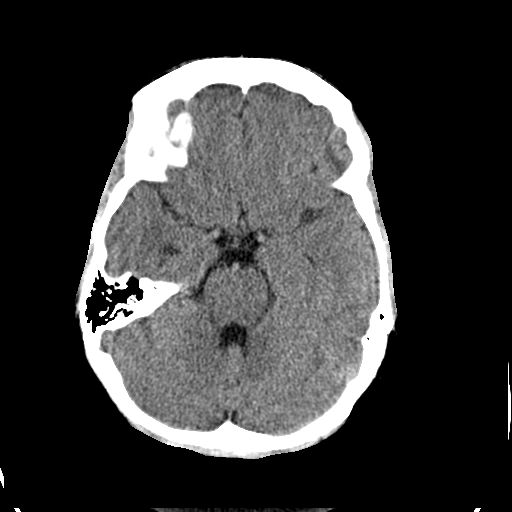
[im 17/36  brain]
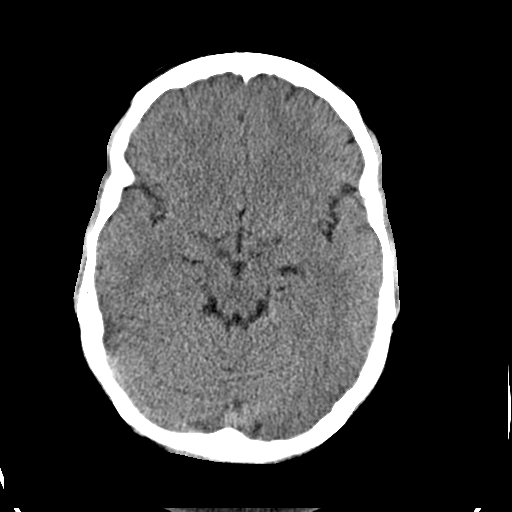
[im 19/36  brain]
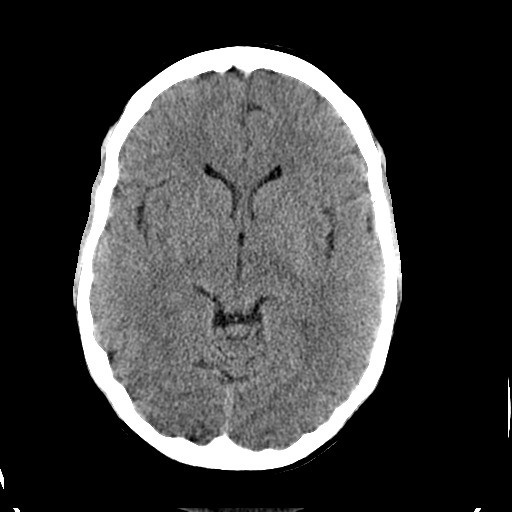
[im 19/36  bone]
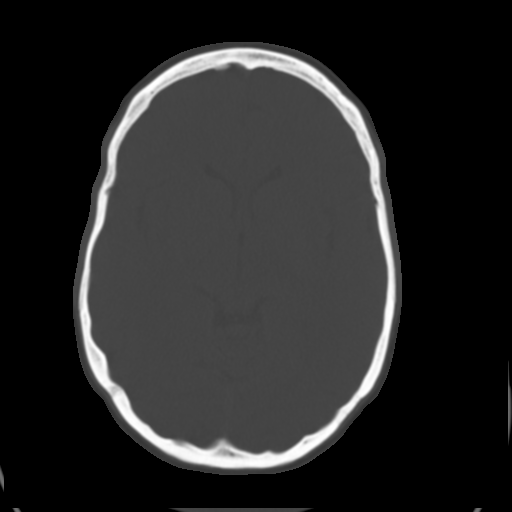
[im 21/36  brain]
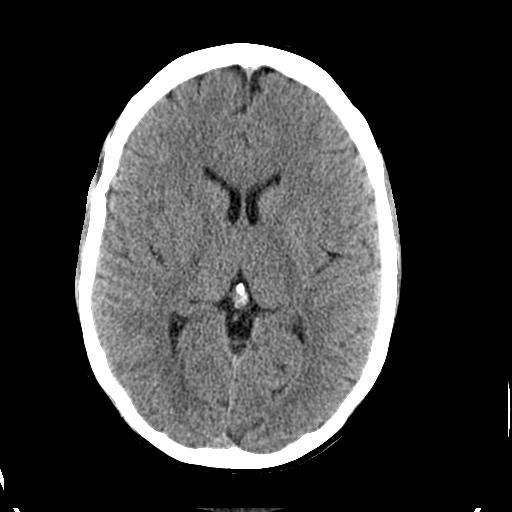
[im 23/36  brain]
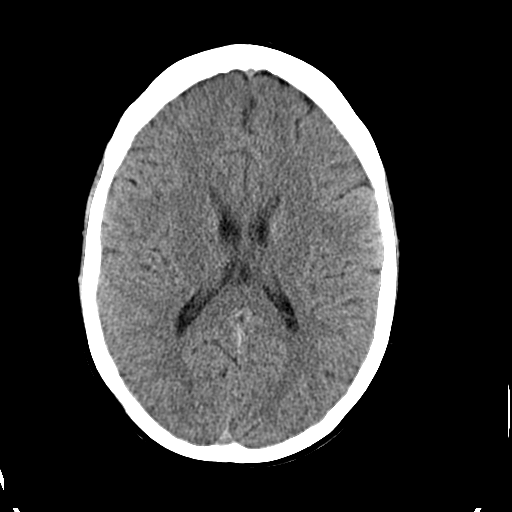
[im 26/36  brain]
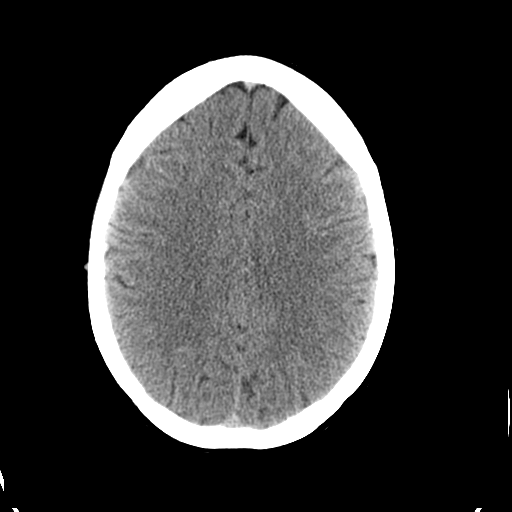
[im 27/36  brain]
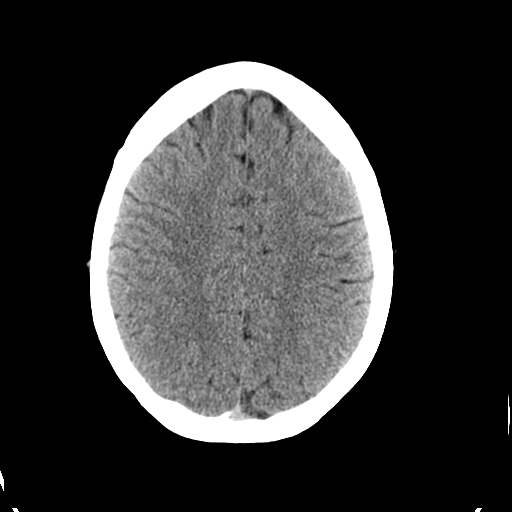
[im 27/36  bone]
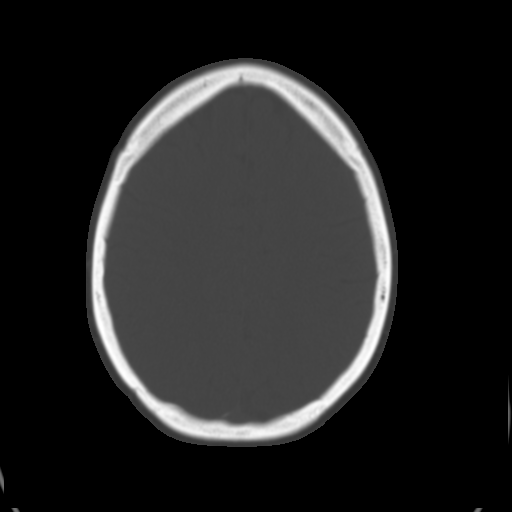
[im 29/36  brain]
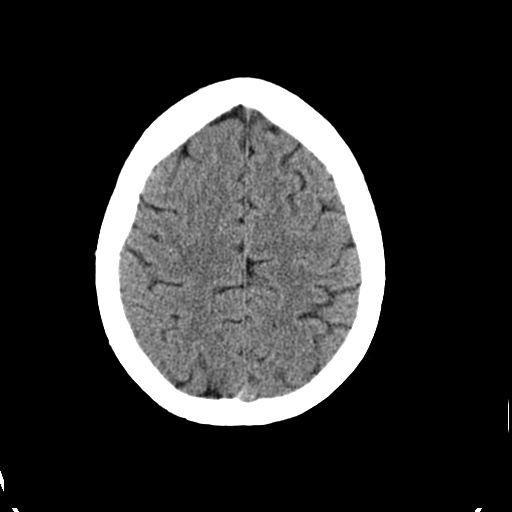
[im 32/36  brain]
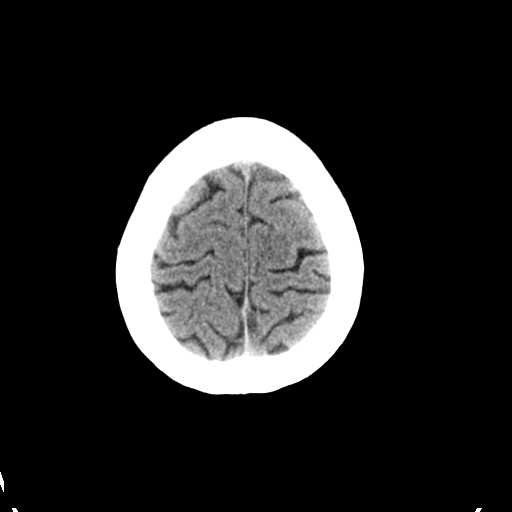
[im 34/36  brain]
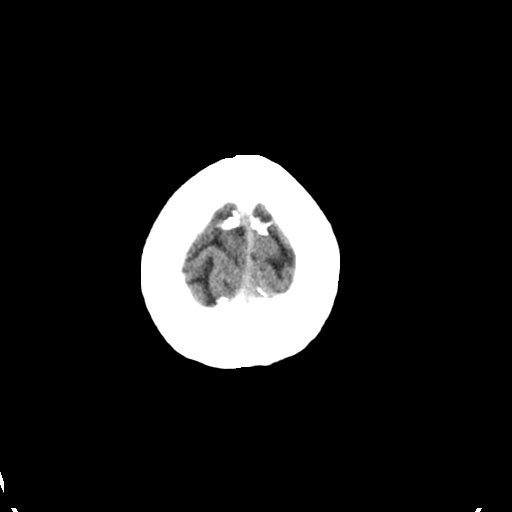

[16 of 30 positions shown; findings below may reference images not displayed]

FINDINGS: The brain has a normal appearance without evidence for
hemorrhage, infarction, hydrocephalus, or mass lesion.  Chronic
mucoperiosteal thickening involves the right maxillary sinus.
There is mild mucosal thickening involving the left maxillary
sinus.  The mastoid air cells are clear.
IMPRESSION: Normal brain

Chronic maxillary sinus disease.

## 2011-11-09 ENCOUNTER — Encounter: Payer: Self-pay | Admitting: Cardiovascular Disease

## 2011-11-09 DIAGNOSIS — Q211 Atrial septal defect: Secondary | ICD-10-CM

## 2011-11-09 NOTE — Telephone Encounter (Signed)
This encounter was created in error - please disregard.

## 2011-11-09 NOTE — Telephone Encounter (Signed)
New problem:   Need echo prior to appt in November

## 2011-11-09 NOTE — Telephone Encounter (Signed)
Order placed for echo to be performed prior to appointment with Dr Excell Seltzer.

## 2011-11-23 ENCOUNTER — Ambulatory Visit (HOSPITAL_COMMUNITY): Payer: BC Managed Care – PPO | Attending: Internal Medicine

## 2011-11-23 DIAGNOSIS — Q2111 Secundum atrial septal defect: Secondary | ICD-10-CM

## 2011-11-23 DIAGNOSIS — I079 Rheumatic tricuspid valve disease, unspecified: Secondary | ICD-10-CM | POA: Insufficient documentation

## 2011-11-23 DIAGNOSIS — Q211 Atrial septal defect: Secondary | ICD-10-CM

## 2011-11-23 NOTE — Progress Notes (Signed)
Echocardiogram performed.  

## 2011-12-16 ENCOUNTER — Encounter: Payer: Self-pay | Admitting: Cardiovascular Disease

## 2011-12-16 ENCOUNTER — Ambulatory Visit (INDEPENDENT_AMBULATORY_CARE_PROVIDER_SITE_OTHER): Payer: BC Managed Care – PPO | Admitting: Cardiovascular Disease

## 2011-12-16 VITALS — BP 113/78 | HR 81 | Ht 66.0 in | Wt 155.2 lb

## 2011-12-16 DIAGNOSIS — Q211 Atrial septal defect: Secondary | ICD-10-CM

## 2011-12-16 NOTE — Patient Instructions (Addendum)
Your physician wants you to follow-up in: 1 year with Dr. Cooper.  You will receive a reminder letter in the mail two months in advance. If you don't receive a letter, please call our office to schedule the follow-up appointment.  

## 2011-12-16 NOTE — Progress Notes (Signed)
   HPI:  36 year old woman presenting for followup evaluation. She has a history of PFO and recurrent TIA. She underwent transcatheter closure of her atrial septal communication in 2011. She has done well without recurrent problems. She is maintained on aspirin 81 mg daily. She denies any recent cardiopulmonary symptoms and specifically denies exertional chest pain or pressure. Dyspnea, palpitations, edema, orthopnea, or PND. She does have occasional chest pain located in a focal area of left chest and unrelated to exertion. These episodes last less than a few minutes and are self-limited. She's had them intermittently since that time of her procedure without any change in the pattern.  Outpatient Encounter Prescriptions as of 12/16/2011  Medication Sig Dispense Refill  . aspirin 81 MG tablet Take 81 mg by mouth daily.        . fish oil-omega-3 fatty acids 1000 MG capsule Take 2 g by mouth daily.        . [DISCONTINUED] Fexofenadine HCl (ALLEGRA PO) Take 1 tablet by mouth daily.        . [DISCONTINUED] topiramate (TOPAMAX) 25 MG capsule Take 25 mg by mouth 2 (two) times daily.          Allergies  Allergen Reactions  . Latex     Past Medical History  Diagnosis Date  . Stroke   . Migraine headache     ROS: Negative except as per HPI  BP 113/78  Pulse 81  Ht 5\' 6"  (1.676 m)  Wt 70.398 kg (155 lb 3.2 oz)  BMI 25.05 kg/m2  SpO2 99%  PHYSICAL EXAM: Pt is alert and oriented, NAD HEENT: normal Neck: JVP - normal, carotids 2+= without bruits Lungs: CTA bilaterally CV: RRR without murmur or gallop Abd: soft, NT, Positive BS, no hepatomegaly Ext: no C/C/E, distal pulses intact and equal Skin: warm/dry no rash  EKG:  Normal sinus rhythm 77 beats per minute, within normal limits.  Echo: Study Conclusions  - Left ventricle: The cavity size was normal. Wall thickness was normal. Systolic function was normal. The estimated ejection fraction was in the range of 55% to 60%. - Left  atrium: Amplatzer device well seated. No flow seen by color doppler.  ASSESSMENT AND PLAN: PFO status post transcatheter closure. Her echocardiogram was reviewed and it shows normal cardiac structure and function with appropriate position of her closure device. She will remain on aspirin 81 mg daily. I do not think she requires ongoing echo surveillance and we will see her back in 12 months with an EKG in the office visit. I may consider stopping her aspirin at next years visit as I'm not sure long-term aspirin is indicated in her situation.  Tonny Bollman 12/16/2011 11:06 AM

## 2012-02-04 ENCOUNTER — Encounter (HOSPITAL_COMMUNITY): Payer: Self-pay

## 2012-02-04 ENCOUNTER — Emergency Department (HOSPITAL_COMMUNITY)
Admission: EM | Admit: 2012-02-04 | Discharge: 2012-02-04 | Disposition: A | Discharge status: Other Acute Inpatient Hospital | Payer: BC Managed Care – PPO | Source: Home / Self Care

## 2012-02-04 DIAGNOSIS — R51 Headache: Secondary | ICD-10-CM

## 2012-02-04 DIAGNOSIS — R519 Headache, unspecified: Secondary | ICD-10-CM

## 2012-02-04 NOTE — ED Provider Notes (Signed)
History     CSN: [id number]  Arrival date & time 02/04/12  1203   First MD Initiated Contact with Patient 02/04/12 1217      Chief Complaint  Patient presents with  . Headache    (Consider location/radiation/quality/duration/timing/severity/associated sxs/prior treatment) HPI Comments: This is a 37 year old female with a history of  past CVAs. One in each hemisphere. She suffered mild right hemi paresis but has improved significantly through time and rehabilitation. She also has a history of migraine headaches. For the past 2-3 weeks she has been having near daily, intermittent headaches occurring in the right hemisphere primarily in the temporofrontal area. It is associated with mild photophobia and is unlike any of her previous migraine-type headaches.  She rates today's headache at 9/10 and now a 6/10. She is crying with headache pain. She is complaining of right facial paresthesias and photophobia.  Presents alert, oriented with clear speech and appropriate content and thought processes. No overt neurologic deficits. Denies problems with hearing speech or swallowing however she does have blurring of vision. Denies focal weakness.   Past Medical History  Diagnosis Date  . Stroke   . Migraine headache     Past Surgical History  Procedure Date  . Wisdom tooth extraction     History reviewed. No pertinent family history.  History  Substance Use Topics  . Smoking status: Never Smoker   . Smokeless tobacco: Not on file  . Alcohol Use: Not on file    OB History    Grav Para Term Preterm Abortions TAB SAB Ect Mult Living                  Review of Systems  Constitutional: Positive for activity change. Negative for fever, chills and diaphoresis.  HENT: Positive for neck pain. Negative for ear pain, congestion, sore throat, facial swelling, rhinorrhea, trouble swallowing and postnasal drip.   Eyes: Positive for photophobia and visual disturbance. Negative for discharge and  itching.       Pain circumscribing and behind the right eye  Respiratory: Negative.   Cardiovascular: Negative.   Gastrointestinal: Negative.   Genitourinary: Negative.   Skin: Negative.   Neurological: Positive for dizziness, light-headedness, numbness and headaches. Negative for tremors, seizures, syncope, facial asymmetry and speech difficulty.  Psychiatric/Behavioral: Negative.     Allergies  Latex  Home Medications   Current Outpatient Rx  Name  Route  Sig  Dispense  Refill  . ASPIRIN 81 MG PO TABS   Oral   Take 81 mg by mouth daily.           . OMEGA-3 FATTY ACIDS 1000 MG PO CAPS   Oral   Take 2 g by mouth daily.             BP 122/87  Pulse 80  Temp 98 F (36.7 C) (Oral)  Resp 10  SpO2 99%  Physical Exam  Nursing note and vitals reviewed. Constitutional: She is oriented to person, place, and time. She appears well-developed and well-nourished. No distress.  HENT:  Mouth/Throat: Oropharynx is clear and moist. No oropharyngeal exudate.       Soft palate rises symmetrically  Eyes: EOM are normal. Pupils are equal, round, and reactive to light.  Neck: Normal range of motion. Neck supple. No tracheal deviation present.  Cardiovascular: Normal rate, regular rhythm and normal heart sounds.   Pulmonary/Chest: Effort normal and breath sounds normal. No respiratory distress.  Musculoskeletal: Normal range of motion. She exhibits no edema.  Lymphadenopathy:    She has no cervical adenopathy.  Neurological: She is alert and oriented to person, place, and time. She has normal strength. She exhibits normal muscle tone.       Failed tandem gait Negative Romberg Upper extremity motor 5 over 5 and symmetric Cranial nerve II through XII grossly intact    ED Course  Procedures (including critical care time)  Labs Reviewed - No data to display No results found.   1. Headache, acute       MDM  37 year old female with a history of CVAs x2 and migraine  headache. She developed a sudden acute onset of severe right temporofrontal pain approximately 10:00 this morning. Is the worst headache ever had. It is unlike any of her previous migraine headaches, associated with right facial numbness and photophobia. Due to the nature of the headache, intensity, onset and history of CVAs to be transferred to the emergency department for additional evaluation and management. She is stable at this time and not exhibiting any neurologic deficit so I think she will be okay for her to go down by shuttle. She is accompanied by a significant other.        Hayden Rasmussen, NP 02/04/12 1249

## 2012-02-04 NOTE — ED Provider Notes (Signed)
Medical screening examination/treatment/procedure(s) were performed by non-physician practitioner and as supervising physician I was immediately available for consultation/collaboration.  Leslee Home, M.D.   Reuben Likes, MD 02/04/12 [phone]

## 2012-02-04 NOTE — ED Notes (Addendum)
Sudden severe HA, ~10 am today, history of stoke, irregular HB, heart surgery ; no notable neuro deficit

## 2012-09-07 ENCOUNTER — Other Ambulatory Visit: Payer: Self-pay | Admitting: Neurology

## 2013-06-01 ENCOUNTER — Telehealth: Payer: Self-pay | Admitting: Cardiovascular Disease

## 2013-06-01 NOTE — Telephone Encounter (Signed)
Telephone note faxed

## 2013-06-01 NOTE — Telephone Encounter (Signed)
Yes this is fine, thx

## 2013-06-01 NOTE — Telephone Encounter (Signed)
New Message:  Pt is having a right knee scope ACL reconstruction surgery... Pt is scheduled on 06/08/13.. Surgeon is Dr. Jerl Santosalldorf.Maralyn Sago.   Sarah is asking if the pt can stop her aspirin prior to surgery.   [phone] (fax)

## 2013-12-15 ENCOUNTER — Other Ambulatory Visit: Payer: Self-pay | Admitting: Obstetrics and Gynecology

## 2013-12-18 LAB — CYTOLOGY - PAP

## 2014-01-10 ENCOUNTER — Other Ambulatory Visit: Payer: Self-pay | Admitting: Family Medicine

## 2014-01-10 DIAGNOSIS — I679 Cerebrovascular disease, unspecified: Secondary | ICD-10-CM

## 2014-01-20 ENCOUNTER — Ambulatory Visit
Admission: RE | Admit: 2014-01-20 | Discharge: 2014-01-20 | Disposition: A | Discharge status: Home / Self Care | Payer: BC Managed Care – PPO | Source: Ambulatory Visit | Attending: Family Medicine | Admitting: Family Medicine

## 2014-01-20 DIAGNOSIS — I679 Cerebrovascular disease, unspecified: Secondary | ICD-10-CM

## 2014-01-20 MED ORDER — GADOBENATE DIMEGLUMINE 529 MG/ML IV SOLN
15.0000 mL | Freq: Once | INTRAVENOUS | Status: AC | PRN
  Administered 2014-01-20: 15 mL via INTRAVENOUS

## 2015-12-03 DIAGNOSIS — R51 Headache: Secondary | ICD-10-CM | POA: Diagnosis not present

## 2015-12-03 DIAGNOSIS — G43111 Migraine with aura, intractable, with status migrainosus: Secondary | ICD-10-CM | POA: Diagnosis not present

## 2015-12-03 DIAGNOSIS — G43019 Migraine without aura, intractable, without status migrainosus: Secondary | ICD-10-CM | POA: Diagnosis not present

## 2015-12-03 DIAGNOSIS — G43839 Menstrual migraine, intractable, without status migrainosus: Secondary | ICD-10-CM | POA: Diagnosis not present

## 2015-12-13 DIAGNOSIS — N61 Mastitis without abscess: Secondary | ICD-10-CM | POA: Diagnosis not present

## 2015-12-24 DIAGNOSIS — Z01419 Encounter for gynecological examination (general) (routine) without abnormal findings: Secondary | ICD-10-CM | POA: Diagnosis not present

## 2015-12-24 DIAGNOSIS — Z6823 Body mass index (BMI) 23.0-23.9, adult: Secondary | ICD-10-CM | POA: Diagnosis not present

## 2015-12-31 DIAGNOSIS — J029 Acute pharyngitis, unspecified: Secondary | ICD-10-CM | POA: Diagnosis not present

## 2015-12-31 DIAGNOSIS — J0141 Acute recurrent pansinusitis: Secondary | ICD-10-CM | POA: Diagnosis not present

## 2016-01-15 DIAGNOSIS — Z23 Encounter for immunization: Secondary | ICD-10-CM | POA: Diagnosis not present

## 2016-01-15 DIAGNOSIS — H6983 Other specified disorders of Eustachian tube, bilateral: Secondary | ICD-10-CM | POA: Diagnosis not present

## 2016-01-22 DIAGNOSIS — R05 Cough: Secondary | ICD-10-CM | POA: Diagnosis not present

## 2016-01-22 DIAGNOSIS — J22 Unspecified acute lower respiratory infection: Secondary | ICD-10-CM | POA: Diagnosis not present

## 2016-01-22 DIAGNOSIS — R52 Pain, unspecified: Secondary | ICD-10-CM | POA: Diagnosis not present

## 2016-05-15 DIAGNOSIS — G43839 Menstrual migraine, intractable, without status migrainosus: Secondary | ICD-10-CM | POA: Diagnosis not present

## 2016-05-15 DIAGNOSIS — G43111 Migraine with aura, intractable, with status migrainosus: Secondary | ICD-10-CM | POA: Diagnosis not present

## 2016-05-15 DIAGNOSIS — G43019 Migraine without aura, intractable, without status migrainosus: Secondary | ICD-10-CM | POA: Diagnosis not present

## 2016-05-22 DIAGNOSIS — M25561 Pain in right knee: Secondary | ICD-10-CM | POA: Diagnosis not present

## 2016-06-03 DIAGNOSIS — J329 Chronic sinusitis, unspecified: Secondary | ICD-10-CM | POA: Diagnosis not present

## 2016-06-03 DIAGNOSIS — B9689 Other specified bacterial agents as the cause of diseases classified elsewhere: Secondary | ICD-10-CM | POA: Diagnosis not present

## 2016-06-03 DIAGNOSIS — J40 Bronchitis, not specified as acute or chronic: Secondary | ICD-10-CM | POA: Diagnosis not present

## 2016-06-08 DIAGNOSIS — M25561 Pain in right knee: Secondary | ICD-10-CM | POA: Diagnosis not present

## 2016-06-16 DIAGNOSIS — M25561 Pain in right knee: Secondary | ICD-10-CM | POA: Diagnosis not present

## 2016-06-26 DIAGNOSIS — M25561 Pain in right knee: Secondary | ICD-10-CM | POA: Diagnosis not present

## 2016-06-30 DIAGNOSIS — R253 Fasciculation: Secondary | ICD-10-CM | POA: Diagnosis not present

## 2016-06-30 DIAGNOSIS — R21 Rash and other nonspecific skin eruption: Secondary | ICD-10-CM | POA: Diagnosis not present

## 2016-06-30 DIAGNOSIS — Z79899 Other long term (current) drug therapy: Secondary | ICD-10-CM | POA: Diagnosis not present

## 2016-06-30 DIAGNOSIS — E538 Deficiency of other specified B group vitamins: Secondary | ICD-10-CM | POA: Diagnosis not present

## 2016-07-03 DIAGNOSIS — E538 Deficiency of other specified B group vitamins: Secondary | ICD-10-CM | POA: Diagnosis not present

## 2016-07-09 DIAGNOSIS — G43839 Menstrual migraine, intractable, without status migrainosus: Secondary | ICD-10-CM | POA: Diagnosis not present

## 2016-07-09 DIAGNOSIS — G43111 Migraine with aura, intractable, with status migrainosus: Secondary | ICD-10-CM | POA: Diagnosis not present

## 2016-07-09 DIAGNOSIS — R51 Headache: Secondary | ICD-10-CM | POA: Diagnosis not present

## 2016-07-09 DIAGNOSIS — G43019 Migraine without aura, intractable, without status migrainosus: Secondary | ICD-10-CM | POA: Diagnosis not present

## 2016-07-17 DIAGNOSIS — E538 Deficiency of other specified B group vitamins: Secondary | ICD-10-CM | POA: Diagnosis not present

## 2016-07-30 DIAGNOSIS — M2241 Chondromalacia patellae, right knee: Secondary | ICD-10-CM | POA: Diagnosis not present

## 2016-07-30 DIAGNOSIS — S83271A Complex tear of lateral meniscus, current injury, right knee, initial encounter: Secondary | ICD-10-CM | POA: Diagnosis not present

## 2016-07-30 DIAGNOSIS — G8918 Other acute postprocedural pain: Secondary | ICD-10-CM | POA: Diagnosis not present

## 2016-07-30 DIAGNOSIS — M23261 Derangement of other lateral meniscus due to old tear or injury, right knee: Secondary | ICD-10-CM | POA: Diagnosis not present

## 2016-08-06 DIAGNOSIS — M25561 Pain in right knee: Secondary | ICD-10-CM | POA: Diagnosis not present

## 2016-08-06 DIAGNOSIS — Z9889 Other specified postprocedural states: Secondary | ICD-10-CM | POA: Diagnosis not present

## 2016-08-17 DIAGNOSIS — G43109 Migraine with aura, not intractable, without status migrainosus: Secondary | ICD-10-CM | POA: Diagnosis not present

## 2016-08-17 DIAGNOSIS — E538 Deficiency of other specified B group vitamins: Secondary | ICD-10-CM | POA: Diagnosis not present

## 2016-08-17 DIAGNOSIS — R202 Paresthesia of skin: Secondary | ICD-10-CM | POA: Diagnosis not present

## 2016-08-26 DIAGNOSIS — Z9889 Other specified postprocedural states: Secondary | ICD-10-CM | POA: Diagnosis not present

## 2016-10-14 DIAGNOSIS — B373 Candidiasis of vulva and vagina: Secondary | ICD-10-CM | POA: Diagnosis not present

## 2016-10-14 DIAGNOSIS — J018 Other acute sinusitis: Secondary | ICD-10-CM | POA: Diagnosis not present

## 2016-10-14 DIAGNOSIS — R05 Cough: Secondary | ICD-10-CM | POA: Diagnosis not present

## 2016-11-19 ENCOUNTER — Encounter: Payer: Self-pay | Admitting: *Deleted

## 2016-11-20 ENCOUNTER — Encounter: Payer: Self-pay | Admitting: Diagnostic Neuroimaging

## 2016-11-20 ENCOUNTER — Ambulatory Visit (INDEPENDENT_AMBULATORY_CARE_PROVIDER_SITE_OTHER): Payer: BLUE CROSS/BLUE SHIELD | Admitting: Diagnostic Neuroimaging

## 2016-11-20 VITALS — BP 104/74 | HR 83 | Ht 66.0 in | Wt 169.0 lb

## 2016-11-20 DIAGNOSIS — G43109 Migraine with aura, not intractable, without status migrainosus: Secondary | ICD-10-CM | POA: Diagnosis not present

## 2016-11-20 DIAGNOSIS — I63413 Cerebral infarction due to embolism of bilateral middle cerebral arteries: Secondary | ICD-10-CM

## 2016-11-20 MED ORDER — TOPIRAMATE ER 50 MG PO SPRINKLE CAP24: 50.0000 mg | EXTENDED_RELEASE_CAPSULE | Freq: Every day | ORAL | 6 refills | Status: DC

## 2016-11-20 NOTE — Progress Notes (Signed)
[facility]  PATIENT: Tonya Andrews DOB: 1975-02-21  REFERRING CLINICIAN: S Wolters HISTORY FROM: patient and chart review REASON FOR VISIT: new consult    HISTORICAL  CHIEF COMPLAINT:  Chief Complaint  Patient presents with  . Paresthesia of arm    rm 6, New Pt, "problem with right arm since second sroke June 2011"    HISTORY OF PRESENT ILLNESS:   41 year old female here for evaluation of right arm pain, migraines, history of stroke.  March 2011 patient had left face, arm, leg numbness and difficulty talking, right left confusion, and was diagnosed with small right brain ischemic infarction. In June 2011 she had right arm numbness and pain and was diagnosed with punctate left parietal ischemic infarction. These are felt to be related to use of birth control pill and PFO. Patient had PFO closure successfully in 2011. Since that time patient has done well with no further stroke symptoms. She does have intermittent right arm numbness and pain.  Patient also has history of migraine headaches since teenage years with frontal and temporal headaches, photophobia, phonophobia, nausea, vomiting, pressure and pounding sensation. Headaches can last days at a time up to 20 days per month. Patient was on topiramate immediate release in the past with side effects of stomach pain. She was transitioned to extended release topiramate with better results. She also [facility] few [facility]. Headaches had reduced to 3 per month. At some point she developed left eye twitching and heat sensitivity, and felt this was related to her topiramate extended release. She stopped the medication and after some time the symptoms resolved. However headaches have returned with significant frequency and intensity since stopping her medication.   REVIEW OF SYSTEMS: Full 14 system review of systems performed and negative with exception of: Only as per history of  present illness.  ALLERGIES: Allergies  Allergen Reactions  . Latex     Unknown to patient, "happened during surgery"    HOME MEDICATIONS: Outpatient Medications Prior to Visit  Medication Sig Dispense Refill  . fish oil-omega-3 fatty acids 1000 MG capsule Take 2 g by mouth daily.      Marland Kitchen aspirin 81 MG tablet Take 81 mg by mouth daily.       No facility-administered medications prior to visit.     PAST MEDICAL HISTORY: Past Medical History:  Diagnosis Date  . Migraine headache    Dr Domingo Cocking  . Nasal polyp   . PFO (patent foramen ovale) 04/2009   by bubble study/TEE, Dr Burt Knack, cardiology  . Stroke [facility]) 04/2009, 07/2009   Dr Leonie Man    PAST SURGICAL HISTORY: Past Surgical History:  Procedure Laterality Date  . PATENT FORAMEN OVALE(PFO) CLOSURE  09/2009   transcath closure   . WISDOM TOOTH EXTRACTION      FAMILY HISTORY: Family History  Problem Relation Age of Onset  . Hypertension Mother   . Arthritis/Rheumatoid Father     SOCIAL HISTORY:  Social History   Social History  . Marital status: Married    Spouse name: N/A  . Number of children: 3  . Years of education: masters   Occupational History  .      home maker   Social History Main Topics  . Smoking status: Never Smoker  . Smokeless tobacco: Never Used  . Alcohol use Yes     Comment: 1 wine  . Drug use: No  . Sexual activity: Not on file   Other Topics  Concern  . Not on file   Social History Narrative   Lives home with family   Caffeine- coffee, 1 daily     PHYSICAL EXAM  GENERAL EXAM/CONSTITUTIONAL: Vitals:  Vitals:   11/20/16 0848  BP: 104/74  Pulse: 83  Weight: 169 lb (76.7 kg)  Height: '5\' 6"'  (1.676 m)     Body mass index is 27.28 kg/m.  Visual Acuity Screening   Right eye Left eye Both eyes  Without correction: 20/30 20/30   With correction:        Patient is in no distress; well developed, nourished and groomed; neck is supple  CARDIOVASCULAR:  Examination of  carotid arteries is normal; no carotid bruits  Regular rate and rhythm, no murmurs  Examination of peripheral vascular system by observation and palpation is normal  EYES:  Ophthalmoscopic exam of optic discs and posterior segments is normal; no papilledema or hemorrhages  MUSCULOSKELETAL:  Gait, strength, tone, movements noted in Neurologic exam below  NEUROLOGIC: MENTAL STATUS:  No flowsheet data found.  awake, alert, oriented to person, place and time  recent and remote memory intact  normal attention and concentration  language fluent, comprehension intact, naming intact,   fund of knowledge appropriate  CRANIAL NERVE:   2nd - no papilledema on fundoscopic exam  2nd, 3rd, 4th, 6th - pupils equal and reactive to light, visual fields full to confrontation, extraocular muscles intact, no nystagmus  5th - facial sensation symmetric  7th - facial strength symmetric  8th - hearing intact  9th - palate elevates symmetrically, uvula midline  11th - shoulder shrug symmetric  12th - tongue protrusion midline  MOTOR:   normal bulk and tone, full strength in the BUE, BLE  SENSORY:   normal and symmetric to light touch, temperature, vibration  COORDINATION:   finger-nose-finger, fine finger movements normal  REFLEXES:   deep tendon reflexes present and symmetric  GAIT/STATION:   narrow based gait; romberg is negative    DIAGNOSTIC DATA (LABS, IMAGING, TESTING) - I reviewed patient records, labs, notes, testing and imaging myself where available.  Lab Results  Component Value Date   WBC 7.1 09/05/2009   HGB 12.0 09/05/2009   HCT 35.1 (L) 09/05/2009   MCV 90.0 09/05/2009   PLT 226 09/05/2009      Component Value Date/Time   NA 140 09/05/2009 0402   K 4.1 09/05/2009 0402   CL 110 09/05/2009 0402   CO2 26 09/05/2009 0402   GLUCOSE 92 09/05/2009 0402   BUN 11 09/05/2009 0402   CREATININE 0.81 09/05/2009 0402   CALCIUM 8.9 09/05/2009 0402    PROT 6.7 08/11/2009 1943   ALBUMIN 3.8 08/11/2009 1943   AST 16 08/11/2009 1943   ALT 16 08/11/2009 1943   ALKPHOS 43 08/11/2009 1943   BILITOT 0.4 08/11/2009 1943   GFRNONAA >60 09/05/2009 0402   GFRAA  09/05/2009 0402    >60        The eGFR has been calculated using the MDRD equation. This calculation has not been validated in all clinical situations. eGFR's persistently <60 mL/min signify possible Chronic Kidney Disease.   Lab Results  Component Value Date   CHOL  08/12/2009    138        ATP III CLASSIFICATION:  <200     mg/dL   Desirable  200-239  mg/dL   Borderline High  >=240    mg/dL   High          HDL 50  08/12/2009   [place]  08/12/2009    80        Total Cholesterol/HDL:CHD Risk Coronary Heart Disease Risk Table                     Men   Women  1/2 Average Risk   3.4   3.3  Average Risk       5.0   4.4  2 X Average Risk   9.6   7.1  3 X Average Risk  23.4   11.0        Use the calculated Patient Ratio above and the CHD Risk Table to determine the patient's CHD Risk.        ATP III CLASSIFICATION (LDL):  <100     mg/dL   Optimal  100-129  mg/dL   Near or Above                    Optimal  130-159  mg/dL   Borderline  160-189  mg/dL   High  >190     mg/dL   Very High   TRIG 38 08/12/2009   CHOLHDL 2.8 08/12/2009   Lab Results  Component Value Date   HGBA1C  08/11/2009    5.0 (NOTE)                                                                       According to the ADA Clinical Practice Recommendations for 2011, when HbA1c is used as a screening test:   >=6.5%   Diagnostic of Diabetes Mellitus           (if abnormal result  is confirmed)  5.7-6.4%   Increased risk of developing Diabetes Mellitus  References:Diagnosis and Classification of Diabetes Mellitus,Diabetes ZOXW,9604,54(UJWJX 1):S62-S69 and Standards of Medical Care in         Diabetes - 2011,Diabetes Care,2011,34  (Suppl 1):S11-S61.   No results found for: VITAMINB12 No results found  for: TSH   01/20/14 MRI brain [I reviewed images myself and agree with interpretation. -VRP]  - No acute intracranial abnormality and normal MRI appearance of the brain, with no residual of the 2011 lacunar type infarcts Identified.  08/13/09 MRI / MRA head [I reviewed images myself and agree with interpretation. -VRP]  - 2-3 mm acute infarction in the left parietal subcortical white matter. - Normal intracranial MR angiography of the large and medium-sized vessels.  04/14/09 MRI brain [I reviewed images myself and agree with interpretation. -VRP]  - Acute non hemorrhagic infarct involving the right caudate head and body extending to the peripheral aspect of the mid anterior right frontal lobe.   - Minimal nonspecific white matter type changes left frontal lobe as discussed above. - Paranasal sinus mucosal thickening. - Decreased signal intensity bone marrow as noted above.  11/23/11 TTE - Left ventricle: The cavity size was normal. Wall thicknesswas normal. Systolic function was normal. The estimatedejection fraction was in the range of 55% to 60%. - Left atrium: Amplatzer device well seated. No flow seen by color doppler.     ASSESSMENT AND PLAN  41 y.o. year old female here with History of stroke in March and June 2011, possibly related to previous birth control pill  use and PFO, now status post PFO closure. Also with migraine headaches with aura.  Dx:  1. 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 Migraine with aura and without status migrainosus, not intractable      PLAN: - start topiramate ER 41m at bedtime; after 2 weeks increase to 1080mat bedtime; drink plenty of water - consider restarting aspirin 8187maily; will discuss with Dr. SetLeonie Manascular neurologist) - in future, may consider gabapentin in place of topiramate for post-stroke right arm pain and migraine prevention  Meds ordered this encounter    Medications  . Topiramate ER (QUDEXY XR) 50 MG CS24 sprinkle capsule    Sig: Take 50-100 mg by mouth at bedtime.    Dispense:  60 each    Refill:  6   Return in about 3 months (around 02/20/2017).    VIKPenni BombardD 11/03/47/9692:34:93 Certified in Neurology, Neurophysiology and Neuroimaging  [facility] [id number] [phone]

## 2016-11-20 NOTE — Patient Instructions (Signed)
Thank you for coming to see [place] at [facility]. I hope we have been able to provide you high quality care today.  You may receive a patient satisfaction survey over the next few weeks. We would appreciate your feedback and comments so that we may continue to improve ourselves and the health of our patients.  - start topiramate ER 84m at bedtime; after 2 weeks increase to 1061mat bedtime; drink plenty of water  - use tylenol as needed for breakthrough migraine   ~~~~~~~~~~~~~~~~~~~~~~~~~~~~~~~~~~~~~~~~~~~~~~~~~~~~~~~~~~~~~~~~~  DR. Sudiksha Victor'S GUIDE TO HAPPY AND HEALTHY LIVING These are some of my general health and wellness recommendations. Some of them may apply to you better than others. Please use common sense as you try these suggestions and feel free to ask me any questions.   ACTIVITY/FITNESS Mental, social, emotional and physical stimulation are very important for brain and body health. Try learning a new activity (arts, music, language, sports, games).  Keep moving your body to the best of your abilities. You can do this at home, inside or outside, the park, community center, gym or anywhere you like. Consider a physical therapist or personal trainer to get started. Consider the app Sworkit. Fitness trackers such as smart-watches, smart-phones or Fitbits can help as well.   NUTRITION Eat more plants: colorful vegetables, nuts, seeds and berries.  Eat less sugar, salt, preservatives and processed foods.  Avoid toxins such as cigarettes and alcohol.  Drink water when you are thirsty. Warm water with a slice of lemon is an excellent morning drink to start the day.  Consider these websites for more information The Nutrition Source (hthttps://www.henry-hernandez.biz/[employer] (wwWindowBlog.ch  RELAXATION Consider practicing mindfulness meditation or other relaxation techniques such as deep breathing,  prayer, yoga, tai chi, massage. See website mindful.org or the apps Headspace or Calm to help get started.   SLEEP Try to get at least 7-8+ hours sleep per day. Regular exercise and reduced caffeine will help you sleep better. Practice good sleep hygeine techniques. See website sleep.org for more information.   PLANNING Prepare estate planning, living will, healthcare POA documents. Sometimes this is best planned with the help of an attorney. Theconversationproject.org and agingwithdignity.org are excellent resources.

## 2016-11-23 ENCOUNTER — Telehealth: Payer: Self-pay | Admitting: *Deleted

## 2016-11-23 NOTE — Telephone Encounter (Signed)
Started PA on Quedexy which was refused by insurance [facility]. [facility] to get options. Spoke with Tiffany who stated she can have Topamax or Topiramate. This RN asked about Topiramate ER. Tiffany stated [facility] cannot order that medication. [facility] other [facility].  Per goodrx.com, drug with coupon $124.97/month.  Will discuss with Dr Marjory LiesPenumalli.

## 2016-11-23 NOTE — Telephone Encounter (Signed)
Received favorable outcome on CMM for Topiramate ER (Quedexy XR). Effective from 11/23/2016 through 02/01/2038. [facility] notified via VM.

## 2016-12-07 DIAGNOSIS — Z23 Encounter for immunization: Secondary | ICD-10-CM | POA: Diagnosis not present

## 2017-02-23 ENCOUNTER — Encounter (INDEPENDENT_AMBULATORY_CARE_PROVIDER_SITE_OTHER): Payer: Self-pay

## 2017-02-23 ENCOUNTER — Ambulatory Visit: Payer: BLUE CROSS/BLUE SHIELD | Admitting: Diagnostic Neuroimaging

## 2017-02-23 ENCOUNTER — Encounter: Payer: Self-pay | Admitting: Diagnostic Neuroimaging

## 2017-02-23 VITALS — BP 115/80 | HR 86 | Ht 66.0 in | Wt 165.6 lb

## 2017-02-23 DIAGNOSIS — I63413 Cerebral infarction due to embolism of bilateral middle cerebral arteries: Secondary | ICD-10-CM | POA: Diagnosis not present

## 2017-02-23 DIAGNOSIS — G43109 Migraine with aura, not intractable, without status migrainosus: Secondary | ICD-10-CM | POA: Diagnosis not present

## 2017-02-23 MED ORDER — TOPIRAMATE ER 50 MG PO SPRINKLE CAP24: 100.0000 mg | EXTENDED_RELEASE_CAPSULE | Freq: Every day | ORAL | 12 refills | Status: DC

## 2017-02-23 NOTE — Patient Instructions (Signed)
-   try increasing topiramate ER to 150mg  at bedtime; drink plenty of water - in future, may consider gabapentin in place of topiramate for post-stroke right arm pain and migraine prevention

## 2017-02-23 NOTE — Progress Notes (Signed)
[facility]  PATIENT: Tonya Andrews DOB: 1975/09/10  REFERRING CLINICIAN: S Wolters HISTORY FROM: patient and chart review REASON FOR VISIT: follow up   HISTORICAL  CHIEF COMPLAINT:  Chief Complaint  Patient presents with  . Follow-up  . Cerebrovascular Accident  . Migraine    doing well (on topiramate er 141m po daily w/ no SE).      HISTORY OF PRESENT ILLNESS:   UPDATE (02/23/17, VRP): Since last visit, doing well. Tolerating TPX ER 1023mat bedtime. No alleviating or aggravating factors. HA are better (now 4-5 per month). Right arm pain is better, but still with dull ache in the hand.   PRIOR HPI (11/23/16): [age]ear old female here for evaluation of right arm pain, migraines, history of stroke. March 2011 patient had left face, arm, leg numbness and difficulty talking, right left confusion, and was diagnosed with small right brain ischemic infarction. In June 2011 she had right arm numbness and pain and was diagnosed with punctate left parietal ischemic infarction. These are felt to be related to use of birth control pill and PFO. Patient had PFO closure successfully in 2011. Since that time patient has done well with no further stroke symptoms. She does have intermittent right arm numbness and pain. Patient also has history of migraine headaches since teenage years with frontal and temporal headaches, photophobia, phonophobia, nausea, vomiting, pressure and pounding sensation. Headaches can last days at a time up to 20 days per month. Patient was on topiramate immediate release in the past with side effects of stomach pain. She was transitioned to extended release topiramate with better results. She also transition to [facility] for last few years. Headaches had reduced to 3 per month. At some point she developed left eye twitching and heat sensitivity, and felt this was related to her topiramate extended release. She stopped the medication and  after some time the symptoms resolved. However headaches have returned with significant frequency and intensity since stopping her medication.   REVIEW OF SYSTEMS: Full 14 system review of systems performed and negative with exception of: only per HPI.   ALLERGIES: Allergies  Allergen Reactions  . Latex     Unknown to patient, "happened during surgery"    HOME MEDICATIONS: Outpatient Medications Prior to Visit  Medication Sig Dispense Refill  . cetirizine (ZYRTEC) 10 MG chewable tablet Chew 10 mg by mouth daily.    . Cholecalciferol (VITAMIN D-3 PO) Take by mouth.    . Cyanocobalamin (VITAMIN B 12 PO) Take by mouth.    . fish oil-omega-3 fatty acids 1000 MG capsule Take 2 g by mouth daily.      . Topiramate ER (QUDEXY XR) 50 MG CS24 sprinkle capsule Take 50-100 mg by mouth at bedtime. (Patient taking differently: Take 100 mg by mouth at bedtime. ) 60 each 6  . aspirin 81 MG tablet Take 81 mg by mouth daily.       No facility-administered medications prior to visit.     PAST MEDICAL HISTORY: Past Medical History:  Diagnosis Date  . Migraine headache    Dr FrDomingo Cocking. Nasal polyp   . PFO (patent foramen ovale) 04/2009   by bubble study/TEE, Dr CoBurt Knackcardiology  . [facility]   Dr SeLeonie Man  PAST SURGICAL HISTORY: Past Surgical History:  Procedure Laterality Date  . PATENT FORAMEN OVALE(PFO) CLOSURE  09/2009   transcath closure   . WISDOM TOOTH EXTRACTION  FAMILY HISTORY: Family History  Problem Relation Age of Onset  . Hypertension Mother   . Arthritis/Rheumatoid Father     SOCIAL HISTORY:  Social History   Socioeconomic History  . Marital status: Married    Spouse name: Not on file  . Number of children: 3  . Years of education: masters  . Highest education level: Not on file  Social Needs  . Financial resource strain: Not on file  . Food insecurity - worry: Not on file  . Food insecurity - inability: Not on file  . Transportation  needs - medical: Not on file  . 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 Smoking status: Never Smoker  . Smokeless tobacco: Never Used  Substance and Sexual Activity  . Alcohol use: Yes    Comment: 1 wine  . Drug use: No  . Sexual activity: Not on file  Other Topics Concern  . Not on file  Social History Narrative   Lives home with family   Caffeine- coffee, 1 daily     PHYSICAL EXAM  GENERAL EXAM/CONSTITUTIONAL: Vitals:  Vitals:   02/23/17 0834  BP: 115/80  Pulse: 86  Weight: 165 lb 9.6 oz (75.1 kg)  Height: _0  (1.676 m)   Body mass index is 26.73 kg/m. No exam data present  Patient is in no distress; well developed, nourished and groomed; neck is supple  CARDIOVASCULAR:  Examination of carotid arteries is normal; no carotid bruits  Regular rate and rhythm, no murmurs  Examination of peripheral vascular system by observation and palpation is normal  EYES:  Ophthalmoscopic exam of optic discs and posterior segments is normal; no papilledema or hemorrhages  MUSCULOSKELETAL:  Gait, strength, tone, movements noted in Neurologic exam below  NEUROLOGIC: MENTAL STATUS:  No flowsheet data found.  awake, alert, oriented to person, place and time  recent and remote memory intact  normal attention and concentration  language fluent, comprehension intact, naming intact,   fund of knowledge appropriate  CRANIAL NERVE:   2nd - no papilledema on fundoscopic exam  2nd, 3rd, 4th, 6th - pupils equal and reactive to light, visual fields full to confrontation, extraocular muscles intact, no nystagmus  5th - facial sensation symmetric  7th - facial strength symmetric  8th - hearing intact  9th - palate elevates symmetrically, uvula midline  11th - shoulder shrug symmetric  12th - tongue protrusion midline  MOTOR:   normal bulk and tone, full strength in the BUE, BLE  SENSORY:     normal and symmetric to light touch, temperature, vibration  COORDINATION:   finger-nose-finger, fine finger movements normal  REFLEXES:   deep tendon reflexes present and symmetric  GAIT/STATION:   narrow based gait    DIAGNOSTIC DATA (LABS, IMAGING, TESTING) - I reviewed patient records, labs, notes, testing and imaging myself where available.  Lab Results  Component Value Date   WBC 7.1 09/05/2009   HGB 12.0 09/05/2009   HCT 35.1 (L) 09/05/2009   MCV 90.0 09/05/2009   PLT 226 09/05/2009      Component Value Date/Time   NA 140 09/05/2009 0402   K 4.1 09/05/2009 0402   CL 110 09/05/2009 0402   CO2 26 09/05/2009 0402   GLUCOSE 92 09/05/2009 0402   BUN 11 09/05/2009 0402   CREATININE 0.81 09/05/2009 0402   CALCIUM 8.9 09/05/2009 0402   PROT 6.7 08/11/2009 1943   ALBUMIN 3.8 08/11/2009  1943   AST 16 08/11/2009 1943   ALT 16 08/11/2009 1943   ALKPHOS 43 08/11/2009 1943   BILITOT 0.4 08/11/2009 1943   GFRNONAA >60 09/05/2009 0402   GFRAA  09/05/2009 0402    >60        The eGFR has been calculated using the MDRD equation. This calculation has not been validated in all clinical situations. eGFR's persistently <60 mL/min signify possible Chronic Kidney Disease.   Lab Results  Component Value Date   CHOL  08/12/2009    138        ATP III CLASSIFICATION:  <200     mg/dL   Desirable  200-239  mg/dL   Borderline High  >=240    mg/dL   High          HDL 50 08/12/2009   LDLCALC  08/12/2009    80        Total Cholesterol/HDL:CHD Risk Coronary Heart Disease Risk Table                     Men   Women  1/2 Average Risk   3.4   3.3  Average Risk       5.0   4.4  2 X Average Risk   9.6   7.1  3 X Average Risk  23.4   11.0        Use the calculated Patient Ratio above and the CHD Risk Table to determine the patient's CHD Risk.        ATP III CLASSIFICATION (LDL):  <100     mg/dL   Optimal  100-129  mg/dL   Near or Above                    Optimal   130-159  mg/dL   Borderline  160-189  mg/dL   High  >190     mg/dL   Very High   TRIG 38 08/12/2009   CHOLHDL 2.8 08/12/2009   Lab Results  Component Value Date   HGBA1C  08/11/2009    5.0 (NOTE)                                                                       According to the ADA Clinical Practice Recommendations for 2011, when HbA1c is used as a screening test:   >=6.5%   Diagnostic of Diabetes Mellitus           (if abnormal result  is confirmed)  5.7-6.4%   Increased risk of developing Diabetes Mellitus  References:Diagnosis and Classification of Diabetes Mellitus,Diabetes ZOXW,9604,54(UJWJX 1):S62-S69 and Standards of Medical Care in         Diabetes - 2011,Diabetes Care,2011,34  (Suppl 1):S11-S61.   No results found for: VITAMINB12 No results found for: TSH   01/20/14 MRI brain [I reviewed images myself and agree with interpretation. -VRP]  - No acute intracranial abnormality and normal MRI appearance of the brain, with no residual of the 2011 lacunar type infarcts Identified.  08/13/09 MRI / MRA head [I reviewed images myself and agree with interpretation. -VRP]  - 2-3 mm acute infarction in the left parietal subcortical white matter. - Normal intracranial MR angiography of the large and  medium-sized vessels.  04/14/09 MRI brain [I reviewed images myself and agree with interpretation. -VRP]  - Acute non hemorrhagic infarct involving the right caudate head and body extending to the peripheral aspect of the mid anterior right frontal lobe.   - Minimal nonspecific white matter type changes left frontal lobe as discussed above. - Paranasal sinus mucosal thickening. - Decreased signal intensity bone marrow as noted above.  11/23/11 TTE - Left ventricle: The cavity size was normal. Wall thicknesswas normal. Systolic function was normal. The estimatedejection fraction was in the range of 55% to 60%. - Left atrium: Amplatzer device well seated. No flow seen by color  doppler.     ASSESSMENT AND PLAN  42 y.o. year old female here with history of stroke in March and June 2011, possibly related to previous birth control pill use and PFO, now status post PFO closure. Also with migraine headaches with aura.  Dx:  1. 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 Migraine with aura and without status migrainosus, not intractable      PLAN: - try increasing topiramate ER to 156m at bedtime; drink plenty of water - in future, may consider gabapentin in place of topiramate for post-stroke right arm pain and migraine prevention - ok to stay off aspirin for now  Meds ordered this encounter  Medications  . Topiramate ER (QUDEXY XR) 50 MG CS24 sprinkle capsule    Sig: Take 100-150 mg by mouth at bedtime.    Dispense:  90 each    Refill:  12   Return in about 6 months (around 08/23/2017).    VPenni Bombard MD 12/03/7508 92:58AM Certified in Neurology, Neurophysiology and Neuroimaging  [facility] [address] [place]  [phone]

## 2017-04-13 ENCOUNTER — Other Ambulatory Visit: Payer: Self-pay | Admitting: Radiology

## 2017-04-13 DIAGNOSIS — N6310 Unspecified lump in the right breast, unspecified quadrant: Secondary | ICD-10-CM

## 2017-04-16 ENCOUNTER — Other Ambulatory Visit: Payer: Self-pay

## 2017-05-31 DIAGNOSIS — R51 Headache: Secondary | ICD-10-CM | POA: Diagnosis not present

## 2017-05-31 DIAGNOSIS — J069 Acute upper respiratory infection, unspecified: Secondary | ICD-10-CM | POA: Diagnosis not present

## 2017-06-19 ENCOUNTER — Other Ambulatory Visit: Payer: Self-pay | Admitting: Diagnostic Neuroimaging

## 2017-08-23 ENCOUNTER — Ambulatory Visit: Payer: BLUE CROSS/BLUE SHIELD | Admitting: Diagnostic Neuroimaging

## 2017-08-23 ENCOUNTER — Encounter: Payer: Self-pay | Admitting: Diagnostic Neuroimaging

## 2017-08-23 VITALS — BP 108/74 | HR 93 | Ht 66.0 in | Wt 165.2 lb

## 2017-08-23 DIAGNOSIS — G43109 Migraine with aura, not intractable, without status migrainosus: Secondary | ICD-10-CM | POA: Diagnosis not present

## 2017-08-23 DIAGNOSIS — I63412 Cerebral infarction due to embolism of left middle cerebral artery: Secondary | ICD-10-CM | POA: Diagnosis not present

## 2017-08-23 MED ORDER — TOPIRAMATE ER 100 MG PO SPRINKLE CAP24: 100.0000 mg | EXTENDED_RELEASE_CAPSULE | Freq: Every day | ORAL | 4 refills | Status: DC

## 2017-08-23 NOTE — Progress Notes (Signed)
[facility]  PATIENT: Tonya Andrews DOB: 08/14/1975  REFERRING CLINICIAN: S Wolters HISTORY FROM: patient and chart review REASON FOR VISIT: follow up   HISTORICAL  CHIEF COMPLAINT:  Chief Complaint  Patient presents with  . Follow-up  . Cerebrovascular Accident    doing ok, not issues.  tolerating topamax well.    . Migraine    HISTORY OF PRESENT ILLNESS:   UPDATE (08/23/17, VRP): Since last visit, doing well. Tolerating meds. No alleviating or aggravating factors. 1 migraine per month; up to 4 milder HA per month.   UPDATE (02/23/17, VRP): Since last visit, doing well. Tolerating TPX ER 139m at bedtime. No alleviating or aggravating factors. HA are better (now 4-5 per month). Right arm pain is better, but still with dull ache in the hand.   PRIOR HPI (11/23/16): 42year old female here for evaluation of right arm pain, migraines, history of stroke. March 2011 patient had left face, arm, leg numbness and difficulty talking, right left confusion, and was diagnosed with small right brain ischemic infarction. In June 2011 she had right arm numbness and pain and was diagnosed with punctate left parietal ischemic infarction. These are felt to be related to use of birth control pill and PFO. Patient had PFO closure successfully in 2011. Since that time patient has done well with no further stroke symptoms. She does have intermittent right arm numbness and pain. Patient also has history of migraine headaches since teenage years with frontal and temporal headaches, photophobia, phonophobia, nausea, vomiting, pressure and pounding sensation. Headaches can last days at a time up to 20 days per month. Patient was on topiramate immediate release in the past with side effects of stomach pain. She was transitioned to extended release topiramate with better results. [facility] also [facility] few years. Headaches had reduced to 3 per month. At some  point she developed left eye twitching and heat sensitivity, and felt this was related to her topiramate extended release. She stopped the medication and after some time the symptoms resolved. However headaches have returned with significant frequency and intensity since stopping her medication.   REVIEW OF SYSTEMS: Full 14 system review of systems performed and negative with exception of: only as per HPI.   ALLERGIES: Allergies  Allergen Reactions  . Latex     Unknown to patient, "happened during surgery"    HOME MEDICATIONS: Outpatient Medications Prior to Visit  Medication Sig Dispense Refill  . cetirizine (ZYRTEC) 10 MG chewable tablet Chew 10 mg by mouth daily.    . Cholecalciferol (VITAMIN D-3 PO) Take by mouth.    . Cyanocobalamin (VITAMIN B 12 PO) Take by mouth.    . fish oil-omega-3 fatty acids 1000 MG capsule Take 1 g by mouth daily.     . Topiramate ER (QUDEXY XR) 50 MG CS24 sprinkle capsule Take 100-150 mg by mouth at bedtime. 90 each 12  . aspirin 81 MG tablet Take 81 mg by mouth daily.       No facility-administered medications prior to visit.     PAST MEDICAL HISTORY: Past Medical History:  Diagnosis Date  . Migraine headache    Dr FDomingo Cocking . Nasal polyp   . PFO (patent foramen ovale) 04/2009   by bubble study/TEE, Dr CBurt Knack cardiology  . Stroke ([facility] 04/2009, 07/2009   Dr SLeonie Man   PAST SURGICAL HISTORY: Past Surgical History:  Procedure Laterality Date  . PATENT FORAMEN OVALE(PFO) CLOSURE  09/2009  transcath closure   . WISDOM TOOTH EXTRACTION      FAMILY HISTORY: Family History  Problem Relation Age of Onset  . Hypertension Mother   . Arthritis/Rheumatoid Father     SOCIAL HISTORY:  Social History   Socioeconomic History  . Marital status: Married    Spouse name: Not on file  . Number of children: 3  . Years of education: masters  . 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  Financial resource strain: Not on file  . Food insecurity:    Worry: Not on file    Inability: Not on file  . Transportation needs:    Medical: Not on file    Non-medical: Not on file  Tobacco Use  . Smoking status: Never Smoker  . Smokeless tobacco: Never Used  Substance and Sexual Activity  . Alcohol use: Yes    Comment: 1 wine  . Drug use: No  . Sexual activity: Not on file  Lifestyle  . Physical activity:    Days per week: Not on file    Minutes per session: Not on file  . Stress: Not on file  Relationships  . Social connections:    Talks on phone: Not on file    Gets together: Not on file    Attends religious service: Not on file    Active member of club or organization: Not on file    Attends meetings of clubs or organizations: Not on file    Relationship status: Not on file  . Intimate partner violence:    Fear of current or ex partner: Not on file    Emotionally abused: Not on file    Physically abused: Not on file    Forced sexual activity: Not on file  Other Topics Concern  . Not on file  Social History Narrative   Lives home with family   Caffeine- coffee, 1 daily     PHYSICAL EXAM  GENERAL EXAM/CONSTITUTIONAL: Vitals:  Vitals:   08/23/17 1330  BP: 108/74  Pulse: 93  Weight: 165 lb 3.2 oz (74.9 kg)  Height: '5\' 6"'  (1.676 m)     Body mass index is 26.66 kg/m. Wt Readings from Last 3 Encounters:  08/23/17 165 lb 3.2 oz (74.9 kg)  02/23/17 165 lb 9.6 oz (75.1 kg)  11/20/16 169 lb (76.7 kg)     Patient is in no distress; well developed, nourished and groomed; neck is supple  CARDIOVASCULAR:  Examination of carotid arteries is normal; no carotid bruits  Regular rate and rhythm, no murmurs  Examination of peripheral vascular system by observation and palpation is normal  EYES:  Ophthalmoscopic exam of optic discs and posterior segments is normal; no papilledema or hemorrhages  No exam data present  MUSCULOSKELETAL:  Gait, strength,  tone, movements noted in Neurologic exam below  NEUROLOGIC: MENTAL STATUS:  No flowsheet data found.  awake, alert, oriented to person, place and time  recent and remote memory intact  normal attention and concentration  language fluent, comprehension intact, naming intact,   fund of knowledge appropriate  CRANIAL NERVE:   2nd - no papilledema on fundoscopic exam  2nd, 3rd, 4th, 6th - pupils equal and reactive to light, visual fields full to confrontation, extraocular muscles intact, no nystagmus  5th - facial sensation symmetric  7th - facial strength symmetric  8th - hearing intact  9th - palate elevates symmetrically, uvula midline  11th -  shoulder shrug symmetric  12th - tongue protrusion midline  MOTOR:   normal bulk and tone, full strength in the BUE, BLE  SENSORY:   normal and symmetric to light touch, temperature, vibration  COORDINATION:   finger-nose-finger, fine finger movements normal  REFLEXES:   deep tendon reflexes present and symmetric  GAIT/STATION:   narrow based gait     DIAGNOSTIC DATA (LABS, IMAGING, TESTING) - I reviewed patient records, labs, notes, testing and imaging myself where available.  Lab Results  Component Value Date   WBC 7.1 09/05/2009   HGB 12.0 09/05/2009   HCT 35.1 (L) 09/05/2009   MCV 90.0 09/05/2009   PLT 226 09/05/2009      Component Value Date/Time   NA 140 09/05/2009 0402   K 4.1 09/05/2009 0402   CL 110 09/05/2009 0402   CO2 26 09/05/2009 0402   GLUCOSE 92 09/05/2009 0402   BUN 11 09/05/2009 0402   CREATININE 0.81 09/05/2009 0402   CALCIUM 8.9 09/05/2009 0402   PROT 6.7 08/11/2009 1943   ALBUMIN 3.8 08/11/2009 1943   AST 16 08/11/2009 1943   ALT 16 08/11/2009 1943   ALKPHOS 43 08/11/2009 1943   BILITOT 0.4 08/11/2009 1943   GFRNONAA >60 09/05/2009 0402   GFRAA  09/05/2009 0402    >60        The eGFR has been calculated using the MDRD equation. This calculation has not been validated  in all clinical situations. eGFR's persistently <60 mL/min signify possible Chronic Kidney Disease.   Lab Results  Component Value Date   CHOL  08/12/2009    138        ATP III CLASSIFICATION:  <200     mg/dL   Desirable  200-239  mg/dL   Borderline High  >=240    mg/dL   High          HDL 50 08/12/2009   LDLCALC  08/12/2009    80        Total Cholesterol/HDL:CHD Risk Coronary Heart Disease Risk Table                     Men   Women  1/2 Average Risk   3.4   3.3  Average Risk       5.0   4.4  2 X Average Risk   9.6   7.1  3 X Average Risk  23.4   11.0        Use the calculated Patient Ratio above and the CHD Risk Table to determine the patient's CHD Risk.        ATP III CLASSIFICATION (LDL):  <100     mg/dL   Optimal  100-129  mg/dL   Near or Above                    Optimal  130-159  mg/dL   Borderline  160-189  mg/dL   High  >190     mg/dL   Very High   TRIG 38 08/12/2009   CHOLHDL 2.8 08/12/2009   Lab Results  Component Value Date   HGBA1C  08/11/2009    5.0 (NOTE)  According to the ADA Clinical Practice Recommendations for 2011, when HbA1c is used as a screening test:   >=6.5%   Diagnostic of Diabetes Mellitus           (if abnormal result  is confirmed)  5.7-6.4%   Increased risk of developing Diabetes Mellitus  References:Diagnosis and Classification of Diabetes Mellitus,Diabetes TRVU,0233,43(HWYSH 1):S62-S69 and Standards of Medical Care in         Diabetes - 2011,Diabetes UOHF,2902,11  (Suppl 1):S11-S61.   No results found for: VITAMINB12 No results found for: TSH   01/20/14 MRI brain [I reviewed images myself and agree with interpretation. -VRP]  - No acute intracranial abnormality and normal MRI appearance of the brain, with no residual of the 2011 lacunar type infarcts Identified.  08/13/09 MRI / MRA head [I reviewed images myself and agree with interpretation. -VRP]  - 2-3 mm  acute infarction in the left parietal subcortical white matter. - Normal intracranial MR angiography of the large and medium-sized vessels.  04/14/09 MRI brain [I reviewed images myself and agree with interpretation. -VRP]  - Acute non hemorrhagic infarct involving the right caudate head and body extending to the peripheral aspect of the mid anterior right frontal lobe.   - Minimal nonspecific white matter type changes left frontal lobe as discussed above. - Paranasal sinus mucosal thickening. - Decreased signal intensity bone marrow as noted above.  11/23/11 TTE - Left ventricle: The cavity size was normal. Wall thicknesswas normal. Systolic function was normal. The estimatedejection fraction was in the range of 55% to 60%. - Left atrium: Amplatzer device well seated. No flow seen by color doppler.     ASSESSMENT AND PLAN  42 y.o. year old female here with history of stroke in March and June 2011, possibly related to previous birth control pill use and PFO, now status post PFO closure. Also with migraine headaches with aura.  Dx:  1. Migraine with aura and without status migrainosus, not intractable   2. Cerebrovascular accident (CVA) due to embolism of left middle cerebral artery (HCC)      PLAN:  I spent 15 minutes of face to face time with patient. Greater than 50% of time was spent in counseling and coordination of care with patient. In summary we discussed:   MIGRAINE PREVENTION - continue TPX ER 161m at bedtime; drink plenty of water  STROKE PREVENTION - s/p PFO closure - monitor vascular risk factors - in future, may consider gabapentin in place of topiramate for post-stroke right arm pain and migraine prevention - ok to stay off aspirin for now  Meds ordered this encounter  Medications  . topiramate ER (QUDEXY XR) CS24 sprinkle capsule    Sig: Take 1 capsule (100 mg total) by mouth at bedtime.    Dispense:  90 each    Refill:  4   Return in about 1 year  (around 08/24/2018) for with NP.    VPenni Bombard MD 71/55/2080 12:23PM Certified in Neurology, Neurophysiology and [facility] [address] [place] [phone]

## 2017-08-27 DIAGNOSIS — M7502 Adhesive capsulitis of left shoulder: Secondary | ICD-10-CM | POA: Diagnosis not present

## 2017-09-07 DIAGNOSIS — R238 Other skin changes: Secondary | ICD-10-CM | POA: Diagnosis not present

## 2017-09-07 DIAGNOSIS — L602 Onychogryphosis: Secondary | ICD-10-CM | POA: Diagnosis not present

## 2017-09-07 DIAGNOSIS — B353 Tinea pedis: Secondary | ICD-10-CM | POA: Diagnosis not present

## 2017-09-13 DIAGNOSIS — S46019D Strain of muscle(s) and tendon(s) of the rotator cuff of unspecified shoulder, subsequent encounter: Secondary | ICD-10-CM | POA: Diagnosis not present

## 2017-09-13 DIAGNOSIS — M7502 Adhesive capsulitis of left shoulder: Secondary | ICD-10-CM | POA: Diagnosis not present

## 2017-09-13 DIAGNOSIS — M25512 Pain in left shoulder: Secondary | ICD-10-CM | POA: Diagnosis not present

## 2017-09-30 DIAGNOSIS — B353 Tinea pedis: Secondary | ICD-10-CM | POA: Diagnosis not present

## 2017-09-30 DIAGNOSIS — L03032 Cellulitis of left toe: Secondary | ICD-10-CM | POA: Diagnosis not present

## 2017-09-30 DIAGNOSIS — B351 Tinea unguium: Secondary | ICD-10-CM | POA: Diagnosis not present

## 2017-10-01 DIAGNOSIS — B351 Tinea unguium: Secondary | ICD-10-CM | POA: Diagnosis not present

## 2018-08-25 ENCOUNTER — Encounter: Payer: Self-pay | Admitting: Family Medicine

## 2018-08-25 ENCOUNTER — Ambulatory Visit (INDEPENDENT_AMBULATORY_CARE_PROVIDER_SITE_OTHER): Payer: BC Managed Care – PPO | Admitting: Family Medicine

## 2018-08-25 ENCOUNTER — Ambulatory Visit: Payer: BLUE CROSS/BLUE SHIELD | Admitting: Adult Health

## 2018-08-25 ENCOUNTER — Other Ambulatory Visit: Payer: Self-pay

## 2018-08-25 VITALS — BP 103/74 | HR 66 | Temp 98.4°F | Ht 66.0 in | Wt 165.2 lb

## 2018-08-25 DIAGNOSIS — Z8673 Personal history of transient ischemic attack (TIA), and cerebral infarction without residual deficits: Secondary | ICD-10-CM

## 2018-08-25 DIAGNOSIS — G43709 Chronic migraine without aura, not intractable, without status migrainosus: Secondary | ICD-10-CM | POA: Diagnosis not present

## 2018-08-25 NOTE — Patient Instructions (Signed)
Continue topiramate ER 100mg  at bedtime  Healthy lifestyle with well balanced diet and regular exercise.  Follow up in 1 year  Stroke Prevention Some medical conditions and lifestyle choices can lead to a higher risk for a stroke. You can help to prevent a stroke by making nutrition, lifestyle, and other changes. What nutrition changes can be made?   Eat healthy foods. ? Choose foods that are high in fiber. These include:  Fresh fruits.  Fresh vegetables.  Whole grains. ? Eat at least 5 or more servings of fruits and vegetables each day. Try to fill half of your plate at each meal with fruits and vegetables. ? Choose lean protein foods. These include:  Lowfat (lean) cuts of meat.  Chicken without skin.  Fish.  Tofu.  Beans.  Nuts. ? Eat low-fat dairy products. ? Avoid foods that:  Are high in salt (sodium).  Have saturated fat.  Have trans fat.  Have cholesterol.  Are processed.  Are premade.  Follow eating guidelines as told by your doctor. These may include: ? Reducing how many calories you eat and drink each day. ? Limiting how much salt you eat or drink each day to 1,500 milligrams (mg). ? Using only healthy fats for cooking. These include:  Olive oil.  Canola oil.  Sunflower oil. ? Counting how many carbohydrates you eat and drink each day. What lifestyle changes can be made?  Try to stay at a healthy weight. Talk to your doctor about what a good weight is for you.  Get at least 30 minutes of moderate physical activity at least 5 days a week. This can include: ? Fast walking. ? Biking. ? Swimming.  Do not use any products that have nicotine or tobacco. This includes cigarettes and e-cigarettes. If you need help quitting, ask your doctor. Avoid being around tobacco smoke in general.  Limit how much alcohol you drink to no more than 1 drink a day for nonpregnant women and 2 drinks a day for men. One drink equals 12 oz of beer, 5 oz of wine, or  1 oz of hard liquor.  Do not use drugs.  Avoid taking birth control pills. Talk to your doctor about the risks of taking birth control pills if: ? You are over 479 years old. ? You smoke. ? You get migraines. ? You have had a blood clot. What other changes can be made?  Manage your cholesterol. ? It is important to eat a healthy diet. ? If your cholesterol cannot be managed through your diet, you may also need to take medicines. Take medicines as told by your doctor.  Manage your diabetes. ? It is important to eat a healthy diet and to exercise regularly. ? If your blood sugar cannot be managed through diet and exercise, you may need to take medicines. Take medicines as told by your doctor.  Control your high blood pressure (hypertension). ? Try to keep your blood pressure below 130/80. This can help lower your risk of stroke. ? It is important to eat a healthy diet and to exercise regularly. ? If your blood pressure cannot be managed through diet and exercise, you may need to take medicines. Take medicines as told by your doctor. ? Ask your doctor if you should check your blood pressure at home. ? Have your blood pressure checked every year. Do this even if your blood pressure is normal.  Talk to your doctor about getting checked for a sleep disorder. Signs of this can  include: ? Snoring a lot. ? Feeling very tired.  Take over-the-counter and prescription medicines only as told by your doctor. These may include aspirin or blood thinners (antiplatelets or anticoagulants).  Make sure that any other medical conditions you have are managed. Where to find more information  American Stroke Association: www.strokeassociation.org  National Stroke Association: www.stroke.org Get help right away if:  You have any symptoms of stroke. "BE FAST" is an easy way to remember the main warning signs: ? B - Balance. Signs are dizziness, sudden trouble walking, or loss of balance. ? E - Eyes.  Signs are trouble seeing or a sudden change in how you see. ? F - Face. Signs are sudden weakness or loss of feeling of the face, or the face or eyelid drooping on one side. ? A - Arms. Signs are weakness or loss of feeling in an arm. This happens suddenly and usually on one side of the body. ? S - Speech. Signs are sudden trouble speaking, slurred speech, or trouble understanding what people say. ? T - Time. Time to call emergency services. Write down what time symptoms started.  You have other signs of stroke, such as: ? A sudden, very bad headache with no known cause. ? Feeling sick to your stomach (nausea). ? Throwing up (vomiting). ? Jerky movements you cannot control (seizure). These symptoms may represent a serious problem that is an emergency. Do not wait to see if the symptoms will go away. Get medical help right away. Call your local emergency services (911 in the [place] Do not drive yourself to the hospital. Summary  You can prevent a stroke by eating healthy, exercising, not smoking, drinking less alcohol, and treating other health problems, such as diabetes, high blood pressure, or high cholesterol.  Do not use any products that contain nicotine or tobacco, such as cigarettes and e-cigarettes.  Get help right away if you have any signs or symptoms of a stroke. This information is not intended to replace advice given to you by your health care provider. Make sure you discuss any questions you have with your health care provider. Document Released: 07/21/2011 Document Revised: 03/17/2018 Document Reviewed: 04/22/2016 Elsevier Patient Education  [place]. Migraine Headache A migraine headache is a very strong throbbing pain on one side or both sides of your head. This type of headache can also cause other symptoms. It can last from 4 hours to 3 days. Talk with your doctor about what things may bring on (trigger) this condition. What are the causes? The exact cause of this  condition is not known. This condition may be triggered or caused by:  Drinking alcohol.  Smoking.  Taking medicines, such as: ? Medicine used to treat chest pain (nitroglycerin). ? Birth control pills. ? Estrogen. ? Some blood pressure medicines.  Eating or drinking certain products.  Doing physical activity. Other things that may trigger a migraine headache include:  Having a menstrual period.  Pregnancy.  Hunger.  Stress.  Not getting enough sleep or getting too much sleep.  Weather changes.  Tiredness (fatigue). What increases the risk?  Being 72-86 years old.  Being female.  Having a family history of migraine headaches.  Being Caucasian.  Having depression or anxiety.  Being very overweight. What are the signs or symptoms?  A throbbing pain. This pain may: ? Happen in any area of the head, such as on one side or both sides. ? Make it hard to do daily activities. ? Get worse  with physical activity. ? Get worse around bright lights or loud noises.  Other symptoms may include: ? Feeling sick to your stomach (nauseous). ? Vomiting. ? Dizziness. ? Being sensitive to bright lights, loud noises, or smells.  Before you get a migraine headache, you may get warning signs (an aura). An aura may include: ? Seeing flashing lights or having blind spots. ? Seeing bright spots, halos, or zigzag lines. ? Having tunnel vision or blurred vision. ? Having numbness or a tingling feeling. ? Having trouble talking. ? Having weak muscles.  Some people have symptoms after a migraine headache (postdromal phase), such as: ? Tiredness. ? Trouble thinking (concentrating). How is this treated?  Taking medicines that: ? Relieve pain. ? Relieve the feeling of being sick to your stomach. ? Prevent migraine headaches.  Treatment may also include: ? Having acupuncture. ? Avoiding foods that bring on migraine headaches. ? Learning ways to control your body functions  (biofeedback). ? Therapy to help you know and deal with negative thoughts (cognitive behavioral therapy). Follow these instructions at home: Medicines  Take over-the-counter and prescription medicines only as told by your doctor.  Ask your doctor if the medicine prescribed to you: ? Requires you to avoid driving or using heavy machinery. ? Can cause trouble pooping (constipation). You may need to take these steps to prevent or treat trouble pooping:  Drink enough fluid to keep your pee (urine) pale yellow.  Take over-the-counter or prescription medicines.  Eat foods that are high in fiber. These include beans, whole grains, and fresh fruits and vegetables.  Limit foods that are high in fat and sugar. These include fried or sweet foods. Lifestyle  Do not drink alcohol.  Do not use any products that contain nicotine or tobacco, such as cigarettes, e-cigarettes, and chewing tobacco. If you need help quitting, ask your doctor.  Get at least 8 hours of sleep every night.  Limit and deal with stress. General instructions      Keep a journal to find out what may bring on your migraine headaches. For example, write down: ? What you eat and drink. ? How much sleep you get. ? Any change in what you eat or drink. ? Any change in your medicines.  If you have a migraine headache: ? Avoid things that make your symptoms worse, such as bright lights. ? It may help to lie down in a dark, quiet room. ? Do not drive or use heavy machinery. ? Ask your doctor what activities are safe for you.  Keep all follow-up visits as told by your doctor. This is important. Contact a doctor if:  You get a migraine headache that is different or worse than others you have had.  You have more than 15 headache days in one month. Get help right away if:  Your migraine headache gets very bad.  Your migraine headache lasts longer than 72 hours.  You have a fever.  You have a stiff neck.  You have  trouble seeing.  Your muscles feel weak or like you cannot control them.  You start to lose your balance a lot.  You start to have trouble walking.  You pass out (faint).  You have a seizure. Summary  A migraine headache is a very strong throbbing pain on one side or both sides of your head. These headaches can also cause other symptoms.  This condition may be treated with medicines and changes to your lifestyle.  Keep a journal to find out  what may bring on your migraine headaches.  Contact a doctor if you get a migraine headache that is different or worse than others you have had.  Contact your doctor if you have more than 15 headache days in a month. This information is not intended to replace advice given to you by your health care provider. Make sure you discuss any questions you have with your health care provider. Document Released: 10/29/2007 Document Revised: 05/13/2018 Document Reviewed: 03/03/2018 Elsevier Patient Education  2020 [employer]

## 2018-08-25 NOTE — Progress Notes (Signed)
PATIENT: Tonya Andrews DOB: 1975/09/17  REASON FOR VISIT: follow up HISTORY FROM: patient  Chief Complaint  Patient presents with  . Follow-up    Rm 2, alone.  Is stable  . Migraine     HISTORY OF PRESENT ILLNESS: Today 08/25/18 Tonya Andrews is a 43 y.o. female here today for follow up for migraines and history of stroke. She continues topiramate ER 138m at bedtime. She feels that it helps and tolerates without adverse effects. She has about 4-8 headache days and about 3 migraines per month. She uses complimentary therapy for abortive therapy. She has mild intermittent pain of left arm post CVA. She is eating a vegetarian diet and tries to stay active. She is feeling well today and without complaints.   HISTORY: (copied from Dr PGladstone Lighternote on 08/23/2017)  UPDATE (08/23/17, VRP): Since last visit, doing well. Tolerating meds. No alleviating or aggravating factors. 1 migraine per month; up to 4 milder HA per month.   UPDATE (02/23/17, VRP): Since last visit, doing well. Tolerating TPX ER 1075mat bedtime. No alleviating or aggravating factors. HA are better (now 4-5 per month). Right arm pain is better, but still with dull ache in the hand.   PRIOR HPI (11/23/16): 89ear old female here for evaluation of right arm pain, migraines, history of stroke. March 2011 patient had left face, arm, leg numbness and difficulty talking, right left confusion, and was diagnosed with small right brain ischemic infarction. In June 2011 she had right arm numbness and pain and was diagnosed with punctate left parietal ischemic infarction. These are felt to be related to use of birth control pill and PFO. Patient had PFO closure successfully in 2011. Since that time patient has done well with no further stroke symptoms. She does have intermittent right arm numbness and pain. Patient also has history of migraine headaches since teenage years with frontal and temporal headaches, photophobia, phonophobia,  nausea, vomiting, pressure and pounding sensation. Headaches can last days at a time up to 20 days per month. Patient was on topiramate immediate release in the past with side effects of stomach pain. She was transitioned to extended release topiramate with better results. She also [facility] last few years. Headaches had reduced to 3 per month. At some point she developed left eye twitching and heat sensitivity, and felt this was related to her topiramate extended release. She stopped the medication and after some time the symptoms resolved. However headaches have returned with significant frequency and intensity since stopping her medication.  REVIEW OF SYSTEMS: Out of a complete 14 system review of symptoms, the patient complains only of the following symptoms, none and all other reviewed systems are negative.  ALLERGIES: Allergies  Allergen Reactions  . Latex     Unknown to patient, "happened during surgery"    HOME MEDICATIONS: Outpatient Medications Prior to Visit  Medication Sig Dispense Refill  . cetirizine (ZYRTEC) 10 MG chewable tablet Chew 10 mg by mouth daily.    . Cholecalciferol (VITAMIN D-3 PO) Take by mouth.    . Cyanocobalamin (VITAMIN B 12 PO) Take by mouth.    . fish oil-omega-3 fatty acids 1000 MG capsule Take 1 g by mouth daily.     . Marland Kitchenopiramate ER (QUDEXY XR) CS24 sprinkle capsule Take 1 capsule (100 mg total) by mouth at bedtime. 90 each 4   No facility-administered medications prior to visit.     PAST MEDICAL HISTORY: Past Medical History:  Diagnosis Date  . Migraine headache    Dr Domingo Cocking  . Nasal polyp   . PFO (patent foramen ovale) 04/2009   by bubble study/TEE, Dr Burt Knack, cardiology  . Stroke [facility]) 04/2009, 07/2009   Dr Leonie Man    PAST SURGICAL HISTORY: Past Surgical History:  Procedure Laterality Date  . PATENT FORAMEN OVALE(PFO) CLOSURE  09/2009   transcath closure   . WISDOM TOOTH EXTRACTION      FAMILY  HISTORY: Family History  Problem Relation Age of Onset  . Hypertension Mother   . Arthritis/Rheumatoid Father     SOCIAL HISTORY: Social History   Socioeconomic History  . Marital status: Married    Spouse name: Not on file  . Number of children: 3  . Years of education: masters  . 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 Financial resource strain: Not on file  . Food insecurity    Worry: Not on file    Inability: Not on file  . Transportation needs    Medical: Not on file    Non-medical: Not on file  Tobacco Use  . Smoking status: Never Smoker  . Smokeless tobacco: Never Used  Substance and Sexual Activity  . Alcohol use: Yes    Comment: 1 wine  . Drug use: No  . Sexual activity: Not on file  Lifestyle  . Physical activity    Days per week: Not on file    Minutes per session: Not on file  . Stress: Not on file  Relationships  . Social Herbalist on phone: Not on file    Gets together: Not on file    Attends religious service: Not on file    Active member of club or organization: Not on file    Attends meetings of clubs or organizations: Not on file    Relationship status: Not on file  . Intimate partner violence    Fear of current or ex partner: Not on file    Emotionally abused: Not on file    Physically abused: Not on file    Forced sexual activity: Not on file  Other Topics Concern  . Not on file  Social History Narrative   Lives home with family   Caffeine- coffee, 1 daily    PHYSICAL EXAM  Vitals:   08/25/18 1422  BP: 103/74  Pulse: 66  Temp: 98.4 F (36.9 C)  Weight: 165 lb 3.2 oz (74.9 kg)  Height: 5' 6" (1.676 m)   Body mass index is 26.66 kg/m.  Generalized: Well developed, in no acute distress  Neurological examination  Mentation: Alert oriented to time, place, history taking. Follows all commands speech and language fluent Cranial nerve II-XII: Pupils were equal  round reactive to light. Extraocular movements were full, visual field were full on confrontational test. Facial sensation and strength were normal. Uvula tongue midline. Head turning and shoulder shrug  were normal and symmetric. Motor: The motor testing reveals 5 over 5 strength of all 4 extremities. Good symmetric motor tone is noted throughout.   Coordination: Cerebellar testing reveals good finger-nose-finger and heel-to-shin bilaterally.  Gait and station: Gait is normal.   DIAGNOSTIC DATA (LABS, IMAGING, TESTING) - I reviewed patient records, labs, notes, testing and imaging myself where available.  No flowsheet data found.   Lab Results  Component Value Date   WBC 7.1 09/05/2009   HGB 12.0 09/05/2009   HCT 35.1 (  L) 09/05/2009   MCV 90.0 09/05/2009   PLT 226 09/05/2009      Component Value Date/Time   NA 140 09/05/2009 0402   K 4.1 09/05/2009 0402   CL 110 09/05/2009 0402   CO2 26 09/05/2009 0402   GLUCOSE 92 09/05/2009 0402   BUN 11 09/05/2009 0402   CREATININE 0.81 09/05/2009 0402   CALCIUM 8.9 09/05/2009 0402   PROT 6.7 08/11/2009 1943   ALBUMIN 3.8 08/11/2009 1943   AST 16 08/11/2009 1943   ALT 16 08/11/2009 1943   ALKPHOS 43 08/11/2009 1943   BILITOT 0.4 08/11/2009 1943   GFRNONAA >60 09/05/2009 0402   GFRAA  09/05/2009 0402    >60        The eGFR has been calculated using the MDRD equation. This calculation has not been validated in all clinical situations. eGFR's persistently <60 mL/min signify possible Chronic Kidney Disease.   Lab Results  Component Value Date   CHOL  08/12/2009    138        ATP III CLASSIFICATION:  <200     mg/dL   Desirable  200-239  mg/dL   Borderline High  >=240    mg/dL   High          HDL 50 08/12/2009   LDLCALC  08/12/2009    80        Total Cholesterol/HDL:CHD Risk Coronary Heart Disease Risk Table                     Men   Women  1/2 Average Risk   3.4   3.3  Average Risk       5.0   4.4  2 X Average Risk   9.6    7.1  3 X Average Risk  23.4   11.0        Use the calculated Patient Ratio above and the CHD Risk Table to determine the patient's CHD Risk.        ATP III CLASSIFICATION (LDL):  <100     mg/dL   Optimal  100-129  mg/dL   Near or Above                    Optimal  130-159  mg/dL   Borderline  160-189  mg/dL   High  >190     mg/dL   Very High   TRIG 38 08/12/2009   CHOLHDL 2.8 08/12/2009   Lab Results  Component Value Date   HGBA1C  08/11/2009    5.0 (NOTE)                                                                       According to the ADA Clinical Practice Recommendations for 2011, when HbA1c is used as a screening test:   >=6.5%   Diagnostic of Diabetes Mellitus           (if abnormal result  is confirmed)  5.7-6.4%   Increased risk of developing Diabetes Mellitus  References:Diagnosis and Classification of Diabetes Mellitus,Diabetes RFXJ,8832,54(DIYME 1):S62-S69 and Standards of Medical Care in         Diabetes - 2011,Diabetes Care,2011,34  (Suppl 1):S11-S61.   No results found for: VITAMINB12  No results found for: TSH    ASSESSMENT AND PLAN 43 y.o. year old female  has a past medical history of Migraine headache, Nasal polyp, PFO (patent foramen ovale) (04/2009), and Stroke ([place]) (04/2009, 07/2009). here with     ICD-10-CM   1. Chronic migraine without aura without status migrainosus, not intractable  G43.709   2. History of cardioembolic cerebrovascular accident (CVA)  Z16.73      Holli continues to do well on topiramate ER 100 mg at bedtime.  We will continue as prescribed.  She is not interested in abortive therapy at this time.  We have discussed consideration of CGRP for abortive therapy if she changes her mind.  We have discussed complementary therapies to treat migraines.  We have also reviewed stroke risk factors.  She was advised to follow-up annually, sooner if needed.  She verbalizes understanding and agreement with this plan.   No orders of the defined  types were placed in this encounter.    No orders of the defined types were placed in this encounter.     I spent 15 minutes with the patient. 50% of this time was spent counseling and educating patient on plan of care and medications.    Debbora Presto, FNP-C 08/25/2018, 2:48 PM [facility] [address] [place], [place] [address] [phone]

## 2018-08-30 NOTE — Progress Notes (Signed)
I reviewed note and agree with plan.   Penni Bombard, MD 07/04/930, 3:55 AM Certified in Neurology, Neurophysiology and Neuroimaging  [facility] [address] [place], [place] [address] [phone]

## 2018-09-02 ENCOUNTER — Other Ambulatory Visit: Payer: Self-pay

## 2018-09-02 DIAGNOSIS — Z20822 Contact with and (suspected) exposure to covid-19: Secondary | ICD-10-CM

## 2018-09-04 LAB — NOVEL CORONAVIRUS, NAA: SARS-CoV-2, NAA: NOT DETECTED

## 2018-09-09 ENCOUNTER — Other Ambulatory Visit: Payer: Self-pay | Admitting: Diagnostic Neuroimaging

## 2019-04-01 ENCOUNTER — Ambulatory Visit: Payer: BC Managed Care – PPO | Attending: Internal Medicine

## 2019-04-01 DIAGNOSIS — Z23 Encounter for immunization: Secondary | ICD-10-CM | POA: Insufficient documentation

## 2019-04-01 NOTE — Progress Notes (Signed)
   Covid-19 Vaccination Clinic  Name:  ANNDEE CONNETT    MRN: [id number] DOB: 08-06-1975  04/01/2019  Ms. Alcott was observed post Covid-19 immunization for 15 minutes without incidence. She was provided with Vaccine Information Sheet and instruction to access the V-Safe system.   Ms. Wolven was instructed to call 911 with any severe reactions post vaccine: Marland Kitchen Difficulty breathing  . Swelling of your face and throat  . A fast heartbeat  . A bad rash all over your body  . Dizziness and weakness    Immunizations Administered    Name Date Dose VIS Date Route   Pfizer COVID-19 Vaccine 04/01/2019 12:31 PM 0.3 mL 01/13/2019 Intramuscular   Manufacturer: [employer], [employer]   Lot: [id number]   NDC: [address]-9

## 2019-04-13 ENCOUNTER — Encounter (HOSPITAL_COMMUNITY): Payer: Self-pay | Admitting: Emergency Medicine

## 2019-04-13 ENCOUNTER — Emergency Department (HOSPITAL_COMMUNITY): Payer: BC Managed Care – PPO

## 2019-04-13 ENCOUNTER — Emergency Department (HOSPITAL_COMMUNITY)
Admission: EM | Admit: 2019-04-13 | Discharge: 2019-04-14 | Disposition: A | Discharge status: Home / Self Care | Payer: BC Managed Care – PPO | Attending: Emergency Medicine | Admitting: Emergency Medicine

## 2019-04-13 ENCOUNTER — Other Ambulatory Visit: Payer: Self-pay

## 2019-04-13 DIAGNOSIS — Z79899 Other long term (current) drug therapy: Secondary | ICD-10-CM | POA: Insufficient documentation

## 2019-04-13 DIAGNOSIS — R1031 Right lower quadrant pain: Secondary | ICD-10-CM | POA: Insufficient documentation

## 2019-04-13 DIAGNOSIS — R1033 Periumbilical pain: Secondary | ICD-10-CM | POA: Insufficient documentation

## 2019-04-13 DIAGNOSIS — R109 Unspecified abdominal pain: Secondary | ICD-10-CM

## 2019-04-13 LAB — COMPREHENSIVE METABOLIC PANEL
ALT: 16 U/L (ref 0–44)
AST: 16 U/L (ref 15–41)
Albumin: 3.9 g/dL (ref 3.5–5.0)
Alkaline Phosphatase: 44 U/L (ref 38–126)
Anion gap: 9 (ref 5–15)
BUN: 8 mg/dL (ref 6–20)
CO2: 23 mmol/L (ref 22–32)
Calcium: 9.2 mg/dL (ref 8.9–10.3)
Chloride: 108 mmol/L (ref 98–111)
Creatinine, Ser: 0.86 mg/dL (ref 0.44–1.00)
GFR calc Af Amer: >60 mL/min (ref >60)
GFR calc non Af Amer: >60 mL/min (ref >60)
Glucose, Bld: 97 mg/dL (ref 70–99)
Potassium: 4 mmol/L (ref 3.5–5.1)
Sodium: 140 mmol/L (ref 135–145)
Total Bilirubin: 0.5 mg/dL (ref 0.3–1.2)
Total Protein: 6.8 g/dL (ref 6.5–8.1)

## 2019-04-13 LAB — URINALYSIS, ROUTINE W REFLEX MICROSCOPIC
Bilirubin Urine: NEGATIVE
Glucose, UA: NEGATIVE mg/dL
Hgb urine dipstick: NEGATIVE
Ketones, ur: NEGATIVE mg/dL
Leukocytes,Ua: NEGATIVE
Nitrite: NEGATIVE
Protein, ur: NEGATIVE mg/dL
Specific Gravity, Urine: 1.003 — ABNORMAL LOW (ref 1.005–1.030)
pH: 7 (ref 5.0–8.0)

## 2019-04-13 LAB — CBC
HCT: 43.5 % (ref 36.0–46.0)
Hemoglobin: 14.4 g/dL (ref 12.0–15.0)
MCH: 30.9 pg (ref 26.0–34.0)
MCHC: 33.1 g/dL (ref 30.0–36.0)
MCV: 93.3 fL (ref 80.0–100.0)
Platelets: 297 10*3/uL (ref 150–400)
RBC: 4.66 MIL/uL (ref 3.87–5.11)
RDW: 12.5 % (ref 11.5–15.5)
WBC: 9.2 10*3/uL (ref 4.0–10.5)
nRBC: 0 % (ref 0.0–0.2)

## 2019-04-13 LAB — I-STAT BETA HCG BLOOD, ED (MC, WL, AP ONLY): I-stat hCG, quantitative: <5 m[IU]/mL (ref <5)

## 2019-04-13 LAB — LIPASE, BLOOD: Lipase: 25 U/L (ref 11–51)

## 2019-04-13 MED ORDER — SODIUM CHLORIDE 0.9% FLUSH: 3.0000 mL | Freq: Once | INTRAVENOUS | Status: DC

## 2019-04-13 MED ORDER — IOHEXOL 300 MG/ML  SOLN
100.0000 mL | Freq: Once | INTRAMUSCULAR | Status: AC | PRN
  Administered 2019-04-13: 100 mL via INTRAVENOUS

## 2019-04-13 NOTE — ED Notes (Signed)
Family at bedside. 

## 2019-04-13 NOTE — ED Notes (Signed)
Walk in clinic sending patient for right lower abdominal pain-R/O appey

## 2019-04-13 NOTE — ED Triage Notes (Signed)
Patient with right lower abdominal pain, states that it started after lunch today.  Patient states she went to [facility] and they sent it down here.  Patient denies any nausea or vomiting at this time.  No fevers.

## 2019-04-13 NOTE — ED Provider Notes (Signed)
[facility] EMERGENCY DEPARTMENT Provider Note   CSN: [id number] Arrival date & time: 04/13/19  1906     History Chief Complaint  Patient presents with  . Abdominal Pain    Tonya Andrews is a 44 y.o. female with a hx of migraine headache, PFO, stroke x2 not currently anticoagulated presents to the Emergency Department complaining of gradual, persistent, progressively worsening periumbilical and right lower quadrant abdominal pain onset just after lunch today.  Patient reports pain is significantly worse with movement and walking.  She denies fever, chills, nausea, vomiting.  She reports 1 episode of loose stool this morning without melena or hematochezia.  No previous history of abdominal surgeries.  Patient reports she presented to urgent care several hours ago and had significant tenderness to palpation in the right lower quadrant prompting the referral here to the emergency department.  She reports pain has improved some since that time.  She rates her pain currently at a 3/10 but reports that sitting up and moving around continue to worsen it to a 6/10.  She does not want any pain control at this time.  She denies a history of ovarian cysts.  She is sexually active with 1 long-term female partner.  She denies vaginal urinary symptoms.  No contraceptive usage at this time.  Last menstrual cycle mid February.   The history is provided by the patient and medical records. No language interpreter was used.       Past Medical History:  Diagnosis Date  . Migraine headache    Dr Neale Burly  . Nasal polyp   . PFO (patent foramen ovale) 04/2009   by bubble study/TEE, Dr Excell Seltzer, cardiology  . Stroke [facility]) 04/2009, 07/2009   Dr Pearlean Brownie    Patient Active Problem List   Diagnosis Date Noted  . PFO (patent foramen ovale) 05/03/2009  . LOW HDL 05/02/2009  . History of cardioembolic cerebrovascular accident (CVA) 05/02/2009  . Migraine 05/02/2009    Past Surgical History:   Procedure Laterality Date  . PATENT FORAMEN OVALE(PFO) CLOSURE  09/2009   transcath closure   . WISDOM TOOTH EXTRACTION       OB History   No obstetric history on file.     Family History  Problem Relation Age of Onset  . Hypertension Mother   . Arthritis/Rheumatoid Father     Social History   Tobacco Use  . Smoking status: Never Smoker  . Smokeless tobacco: Never Used  Substance Use Topics  . Alcohol use: Yes    Comment: 1 wine  . Drug use: No    Home Medications Prior to Admission medications   Medication Sig Start Date End Date Taking? Authorizing Provider  cetirizine (ZYRTEC) 10 MG chewable tablet Chew 10 mg by mouth daily.    [provider]  Cholecalciferol (VITAMIN D-3 PO) Take by mouth.    [provider]  Cyanocobalamin (VITAMIN B 12 PO) Take by mouth.    [provider]  fish oil-omega-3 fatty acids 1000 MG capsule Take 1 g by mouth daily.     [provider]  QUDEXY XR 100 MG CS24 sprinkle capsule TAKE 1 CAPSULE (100 MG TOTAL) BY MOUTH AT BEDTIME. 09/12/18   Penumalli, Glenford Bayley, MD    Allergies    Latex  Review of Systems   Review of Systems  Constitutional: Negative for appetite change, diaphoresis, fatigue, fever and unexpected weight change.  HENT: Negative for mouth sores.   Eyes: Negative for visual disturbance.  Respiratory: Negative for cough, chest tightness, shortness of breath and wheezing.   Cardiovascular: Negative for chest pain.  Gastrointestinal: Positive for abdominal pain. Negative for constipation, diarrhea, nausea and vomiting.  Endocrine: Negative for polydipsia, polyphagia and polyuria.  Genitourinary: Negative for dysuria, frequency, hematuria and urgency.  Musculoskeletal: Negative for back pain and neck stiffness.  Skin: Negative for rash.  Allergic/Immunologic: Negative for immunocompromised state.  Neurological: Negative for syncope, light-headedness and headaches.  Hematological: Does not  bruise/bleed easily.  Psychiatric/Behavioral: Negative for sleep disturbance. The patient is not nervous/anxious.     Physical Exam Updated Vital Signs BP 108/83 (BP Location: Right Arm)   Pulse 77   Temp 98.3 F (36.8 C) (Oral)   Resp 18   Ht 5\' 6"  (1.676 m)   Wt 74.8 kg   LMP 03/20/2019 (Approximate)   SpO2 100%   BMI 26.63 kg/m   Physical Exam Vitals and nursing note reviewed.  Constitutional:      General: She is not in acute distress.    Appearance: She is not diaphoretic.  HENT:     Head: Normocephalic.  Eyes:     General: No scleral icterus.    Conjunctiva/sclera: Conjunctivae normal.  Cardiovascular:     Rate and Rhythm: Normal rate and regular rhythm.     Pulses: Normal pulses.          Radial pulses are 2+ on the right side and 2+ on the left side.  Pulmonary:     Effort: No tachypnea, accessory muscle usage, prolonged expiration, respiratory distress or retractions.     Breath sounds: No stridor.     Comments: Equal chest rise. No increased work of breathing. Abdominal:     General: There is no distension.     Palpations: Abdomen is soft.     Tenderness: There is abdominal tenderness in the right lower quadrant, periumbilical area and suprapubic area. There is no right CVA tenderness, left CVA tenderness, guarding or rebound.  Musculoskeletal:     Cervical back: Normal range of motion.     Comments: Moves all extremities equally and without difficulty.  Skin:    General: Skin is warm and dry.     Capillary Refill: Capillary refill takes less than 2 seconds.  Neurological:     Mental Status: She is alert.     GCS: GCS eye subscore is 4. GCS verbal subscore is 5. GCS motor subscore is 6.     Comments: Speech is clear and goal oriented.  Psychiatric:        Mood and Affect: Mood normal.     ED Results / Procedures / Treatments   Labs (all labs ordered are listed, but only abnormal results are displayed) Labs Reviewed  URINALYSIS, ROUTINE W REFLEX  MICROSCOPIC - Abnormal; Notable for the following components:      Result Value   Color, Urine STRAW (*)    APPearance HAZY (*)    Specific Gravity, Urine 1.003 (*)    All other components within normal limits  LIPASE, BLOOD  COMPREHENSIVE METABOLIC PANEL  CBC  I-STAT BETA HCG BLOOD, ED ([facility], [facility], [facility] ONLY)    Radiology 03/22/2019 Transvaginal Non-OB  Result Date: 04/14/2019 CLINICAL DATA:  Initial evaluation for acute right lower quadrant abdominal pain. EXAM: TRANSABDOMINAL AND TRANSVAGINAL ULTRASOUND OF PELVIS DOPPLER ULTRASOUND OF OVARIES TECHNIQUE: Both transabdominal and transvaginal ultrasound examinations of the pelvis were performed. Transabdominal technique was performed for global imaging of the pelvis including uterus, ovaries, adnexal regions, and pelvic  cul-de-sac. It was necessary to proceed with endovaginal exam following the transabdominal exam to visualize the uterus, endometrium, and ovaries. Color and duplex Doppler ultrasound was utilized to evaluate blood flow to the ovaries. COMPARISON:  Prior CT from 04/13/2019. FINDINGS: Uterus Measurements: 8.5 x 4.7 x 5.0 cm = volume: 13.3 mL. No fibroids or other mass visualized. Endometrium Thickness: 11.5 mm.  No focal abnormality visualized. Right ovary Measurements: 2.4 x 2.2 x 1.7 cm = volume: 4.6 mL. Normal appearance/no adnexal mass. Left ovary Measurements: 2.3 x 1.7 x 2.0 cm = volume: 4.1 mL. Normal appearance/no adnexal mass. Pulsed Doppler evaluation of both ovaries demonstrates normal low-resistance arterial and venous waveforms. Other findings No abnormal free fluid. IMPRESSION: Normal pelvic ultrasound. No evidence for torsion or other acute abnormality. Electronically Signed   By: Rise Mu M.D.   On: 04/14/2019 01:38   US Pelvis Complete  Result Date: 04/14/2019 CLINICAL DATA:  Initial evaluation for acute right lower quadrant abdominal pain. EXAM: TRANSABDOMINAL AND TRANSVAGINAL ULTRASOUND OF PELVIS DOPPLER ULTRASOUND  OF OVARIES TECHNIQUE: Both transabdominal and transvaginal ultrasound examinations of the pelvis were performed. Transabdominal technique was performed for global imaging of the pelvis including uterus, ovaries, adnexal regions, and pelvic cul-de-sac. It was necessary to proceed with endovaginal exam following the transabdominal exam to visualize the uterus, endometrium, and ovaries. Color and duplex Doppler ultrasound was utilized to evaluate blood flow to the ovaries. COMPARISON:  Prior CT from 04/13/2019. FINDINGS: Uterus Measurements: 8.5 x 4.7 x 5.0 cm = volume: 13.3 mL. No fibroids or other mass visualized. Endometrium Thickness: 11.5 mm.  No focal abnormality visualized. Right ovary Measurements: 2.4 x 2.2 x 1.7 cm = volume: 4.6 mL. Normal appearance/no adnexal mass. Left ovary Measurements: 2.3 x 1.7 x 2.0 cm = volume: 4.1 mL. Normal appearance/no adnexal mass. Pulsed Doppler evaluation of both ovaries demonstrates normal low-resistance arterial and venous waveforms. Other findings No abnormal free fluid. IMPRESSION: Normal pelvic ultrasound. No evidence for torsion or other acute abnormality. Electronically Signed   By: Rise Mu M.D.   On: 04/14/2019 01:38   CT ABDOMEN PELVIS W CONTRAST  Result Date: 04/14/2019 CLINICAL DATA:  44 year old female with right lower quadrant abdominal pain beginning this afternoon. EXAM: CT ABDOMEN AND PELVIS WITH CONTRAST TECHNIQUE: Multidetector CT imaging of the abdomen and pelvis was performed using the standard protocol following bolus administration of intravenous contrast. CONTRAST:  OMNIPAQUE IOHEXOL 300 MG/ML  SOLN COMPARISON:  CTA chest 04/24/2009. FINDINGS: Lower chest: Cardiac interatrial occluded device in place. Otherwise negative lower chest. Hepatobiliary: Numerous gallstones individually up to 12 mm. But no pericholecystic inflammation. No bile duct enlargement. Negative liver. Pancreas: Negative. Spleen: Negative. Adrenals/Urinary Tract:  Normal adrenal glands. Mostly punctate bilateral nephrolithiasis. There is a 3 mm left lower pole calculus. No hydronephrosis or perinephric stranding. Bilateral renal enhancement and contrast excretion is symmetric and normal. Decompressed proximal ureters. Negative urinary bladder. Incidental bilateral pelvic phleboliths. Stomach/Bowel: Negative large bowel aside from some redundancy and retained stool. Normal appendix seen on series 3, image 50 and coronal image 47. Negative terminal ileum. No dilated small bowel. Decompressed stomach. No free air, free fluid. Vascular/Lymphatic: Major arterial structures are patent with no atherosclerosis identified. Portal venous system is patent. No lymphadenopathy. Reproductive: Negative. Other: No pelvic free fluid. Musculoskeletal: No acute osseous abnormality identified. Benign appearing sclerosis in the right iliac wing. IMPRESSION: 1. Numerous gallstones, but no CT evidence of acute cholecystitis. No biliary ductal enlargement. 2. Nephrolithiasis without obstructive uropathy. 3. Normal appendix.  No acute or inflammatory process identified in the abdomen or pelvis. Electronically Signed   By: Genevie Ann M.D.   On: 04/14/2019 00:03   US Abdomen Limited  Result Date: 04/14/2019 CLINICAL DATA:  Initial evaluation for acute right upper quadrant abdominal pain. EXAM: ULTRASOUND ABDOMEN LIMITED RIGHT UPPER QUADRANT COMPARISON:  Prior CT from 04/13/2019 FINDINGS: Gallbladder: Multiple shadowing echogenic stones seen within the gallbladder lumen, largest of which measures approximately 6 mm. No abnormal gallbladder wall thickening. No free pericholecystic fluid. No sonographic Murphy sign elicited on exam. Common bile duct: Diameter: 4.3 mm Liver: No focal lesion identified. Within normal limits in parenchymal echogenicity. Portal vein is patent on color Doppler imaging with normal direction of blood flow towards the liver. Other: None. IMPRESSION: 1. Cholelithiasis with no  sonographic features to suggest acute cholecystitis. 2. No biliary dilatation. Electronically Signed   By: Jeannine Boga M.D.   On: 04/14/2019 01:41   [place] Art/Ven Flow Abd Pelv Doppler  Result Date: 04/14/2019 CLINICAL DATA:  Initial evaluation for acute right lower quadrant abdominal pain. EXAM: TRANSABDOMINAL AND TRANSVAGINAL ULTRASOUND OF PELVIS DOPPLER ULTRASOUND OF OVARIES TECHNIQUE: Both transabdominal and transvaginal ultrasound examinations of the pelvis were performed. Transabdominal technique was performed for global imaging of the pelvis including uterus, ovaries, adnexal regions, and pelvic cul-de-sac. It was necessary to proceed with endovaginal exam following the transabdominal exam to visualize the uterus, endometrium, and ovaries. Color and duplex Doppler ultrasound was utilized to evaluate blood flow to the ovaries. COMPARISON:  Prior CT from 04/13/2019. FINDINGS: Uterus Measurements: 8.5 x 4.7 x 5.0 cm = volume: 13.3 mL. No fibroids or other mass visualized. Endometrium Thickness: 11.5 mm.  No focal abnormality visualized. Right ovary Measurements: 2.4 x 2.2 x 1.7 cm = volume: 4.6 mL. Normal appearance/no adnexal mass. Left ovary Measurements: 2.3 x 1.7 x 2.0 cm = volume: 4.1 mL. Normal appearance/no adnexal mass. Pulsed Doppler evaluation of both ovaries demonstrates normal low-resistance arterial and venous waveforms. Other findings No abnormal free fluid. IMPRESSION: Normal pelvic ultrasound. No evidence for torsion or other acute abnormality. Electronically Signed   By: Jeannine Boga M.D.   On: 04/14/2019 01:38    Procedures Procedures (including critical care time)  Medications Ordered in ED Medications  sodium chloride flush (NS) 0.9 % injection 3 mL (has no administration in time range)  iohexol (OMNIPAQUE) 300 MG/ML solution 100 mL (100 mLs Intravenous Contrast Given 04/13/19 2320)    ED Course  I have reviewed the triage vital signs and the nursing notes.   Pertinent labs & imaging results that were available during my care of the patient were reviewed by me and considered in my medical decision making (see chart for details).  Clinical Course as of Apr 14 218  Fri Apr 14, 2019  0041 CT without evidence of appendicitis.  On repeat exam, pt continues to be tender in the RLQ, but is also tender in the RUQ.  Gallstones seen on CT.  Will obtain US of gallbladder and ovaries.  Pt continues to rate her pain at a 4/10, but declines pain medication.    [HM]    Clinical Course User Index [HM] Aubrei Bouchie, Gwenlyn Perking   MDM Rules/Calculators/A&P                       Patient presents with periumbilical, right lower quadrant and suprapubic pain.  No rebound or guarding on my exam.  Labs are reassuring.  Will obtain CT scan to assess  for potential appendicitis, colitis.  Did discuss the possibility of ovarian etiology.  If CT scan is negative, patient will need ultrasound to rule out ovarian pathology including torsion.  2:19 AM CT scan without evidence of appendicitis but does show gallstones.  On repeat exam patient had right-sided abdominal pain was localized to the right lower quadrant.  Pelvic ultrasound with no evidence of ovarian torsion or ovarian cyst.  Gallbladder ultrasound shows continued stones but no evidence of cholecystitis.  On repeat exam his abdomen remains soft.  She is without guarding or rebound.  No emesis here in the department.  Vital signs are within normal limits.  Patient will need outpatient follow-up for potential biliary colic.  She will need to return immediately to the emergency department for new or worsening symptoms including worsening pain.  Patient states understanding and is in agreement with the plan.   Final Clinical Impression(s) / ED Diagnoses Final diagnoses:  Right sided abdominal pain    Rx / DC Orders ED Discharge Orders    None       Rexanne Inocencio, Boyd KerbsHannah, PA-C 04/14/19 0220    Dione BoozeGlick, David, MD  04/14/19 [phone]

## 2019-04-14 ENCOUNTER — Emergency Department (HOSPITAL_COMMUNITY): Payer: BC Managed Care – PPO

## 2019-04-14 NOTE — Discharge Instructions (Addendum)
1. Medications: usual home medications 2. Treatment: rest, drink plenty of fluids, advance diet slowly 3. Follow Up: Please followup with your primary doctor in 2-3 days and general surgery in 1 week for discussion of your diagnoses and further evaluation after today's visit; Please return to the ER for persistent vomiting, high fevers or worsening symptoms

## 2019-04-14 NOTE — ED Notes (Signed)
Transported to ultrasound

## 2019-04-22 ENCOUNTER — Ambulatory Visit: Payer: BC Managed Care – PPO | Attending: Internal Medicine

## 2019-04-22 DIAGNOSIS — Z23 Encounter for immunization: Secondary | ICD-10-CM

## 2019-04-22 NOTE — Progress Notes (Signed)
   Covid-19 Vaccination Clinic  Name:  Tonya Andrews    MRN: [id number] DOB: Jan 27, 1976  04/22/2019  [name] was observed post Covid-19 immunization for 15 minutes without incident. She was provided with Vaccine Information Sheet and instruction to access the V-Safe system.   [name] was instructed to call 911 with any severe reactions post vaccine: Marland Kitchen Difficulty breathing  . Swelling of face and throat  . A fast heartbeat  . A bad rash all over body  . Dizziness and weakness   Immunizations Administered    Name Date Dose VIS Date Route   Pfizer COVID-19 Vaccine 04/22/2019 11:56 AM 0.3 mL 01/13/2019 Intramuscular   Manufacturer: [employer], [employer]   Lot: [id number]   NDC: [address]-2

## 2019-04-26 ENCOUNTER — Ambulatory Visit: Payer: Self-pay

## 2019-05-10 ENCOUNTER — Ambulatory Visit: Payer: Self-pay | Admitting: General Surgery

## 2019-05-10 NOTE — H&P (View-Only) (Signed)
Tonya Andrews Documented: 05/10/2019 9:08 AM Location: [facility] Patient #: [id number] DOB: 1976/01/21 Married / Language: Cleophus Molt / Race: White Female  History of Present Illness [name] Zekiah Caruth MD; 05/10/2019 9:49 AM) The patient is a 44 year old female who presents for evaluation of gall stones. She is referred by Dr [name] for evaluation of gallstones. She is accompanied by her husband. She reports a multiyear history of stomach issues. She will have episodes of nausea and vomiting or diarrhea along with right-sided abdominal discomfort. It will generally occur after eating it will last for several more hours. As a result she is gradually modified her diet over the past few years. She is now essentially vegetarian. She found that fatty foods, meats would generally trigger her symptoms. She never really stalling anybody about it. About a month ago she had an episode of intense right-sided pain. She states her whole right side was uncomfortable and it wasn't getting any better so she went to urgent care and because she was complaining of more right lower quadrant pain they thought it might be appendicitis to be sent her to the emergency room. She ended up having a pelvic ultrasound which is unremarkable. She had a CT scan which only showed gallstones. She then had a right upper quadrant ultrasound which reviewed numerous gallstones without any evidence of cholecystitis. Her urinalysis, CBC and comprehensive metabolic panel were normal. She was diagnosed with gallstones and discharge from the emergency room. She reports persistent right-sided pain that's fairly constant however it will increase at times generally after eating and will last for a few hours. She points to her right upper quadrant as being more affected at this point. She has a remote history of a stroke around 2011. She had a PFO and was on birth control. Her PFO was closed. She has some residual right upper extremity  issues as a result. She denies any current TIAs or amaurosis fugax or unilateral intermittent numbness or tingling. She denies any chest pain or chest pressure source of breath or dyspnea on exertion. She has some occasional blood in her stool but it's very rare and infrequent. She has lost about 8 pounds over the past month because of the ongoing discomfort. She denies any acholic stools. She denies any family history of GI issues.     Problem List/Past Medical [name] Redmond Pulling, MD; 05/10/2019 9:50 AM) HISTORY OF CVA IN ADULTHOOD (W58.09) 2011 SYMPTOMATIC CHOLELITHIASIS (K80.20)  Past Surgical History Sabino Gasser, CMA; 05/10/2019 9:08 AM) Knee Surgery Right.  Allergies Sabino Gasser, CMA; 05/10/2019 9:09 AM) No Known Drug Allergies [05/10/2019]: Allergies Reconciled  Medication History Sabino Gasser, CMA; 05/10/2019 9:09 AM) [name] XR (150MG  CP24 Sprinkle, Oral) Active. Cyclobenzaprine HCl (10MG  Tablet, Oral) Active. Medications Reconciled  Social History Sabino Gasser, [place]; 05/10/2019 9:08 AM) Tobacco use Never smoker.  Family History Sabino Gasser, [place]; 05/10/2019 9:08 AM) Arthritis Father. Hypertension Mother.  Other Problems [name] Redmond Pulling, MD; 05/10/2019 9:50 AM) Cerebrovascular Accident Cholelithiasis Migraine Headache     Review of Systems [name] Aizlyn Schifano MD; 05/10/2019 9:43 AM) General Present- Weight Loss. Not Present- Appetite Loss, Chills, Fatigue, Fever, Night Sweats and Weight Gain. HEENT Present- Seasonal Allergies. Not Present- Earache, Hearing Loss, Hoarseness, Nose Bleed, Oral Ulcers, Ringing in the Ears, Sinus Pain, Sore Throat, Visual Disturbances, Wears glasses/contact lenses and Yellow Eyes. Gastrointestinal Present- Abdominal Pain, Chronic diarrhea, Indigestion, Nausea and Vomiting. Not Present- Bloating, Bloody Stool, Change in Bowel Habits, Constipation, Difficulty Swallowing, Excessive gas, Gets full  quickly at meals, Hemorrhoids and Rectal  Pain. Neurological Present- Headaches and Tingling. Not Present- Decreased Memory, Fainting, Numbness, Seizures, Tremor, Trouble walking and Weakness. All other systems negative  Vitals (Angela Holmes CMA; 05/10/2019 9:10 AM) 05/10/2019 9:09 AM Weight: 161.4 lb Height: 66in Body Surface Area: 1.83 m Body Mass Index: 26.05 kg/m  Temp.: 97.7F(Tympanic)  Pulse: 131 (Regular)  BP: 112/72 (Sitting, Left Arm, Standard)        Physical Exam (Jazzy Parmer M. Stewart Sasaki MD; 05/10/2019 9:43 AM)  General Mental Status-Alert. General Appearance-Consistent with stated age. Hydration-Well hydrated. Voice-Normal.  Head and Neck Head-normocephalic, atraumatic with no lesions or palpable masses. Trachea-midline. Thyroid Gland Characteristics - normal size and consistency.  Eye Eyeball - Bilateral-Extraocular movements intact. Sclera/Conjunctiva - Bilateral-No scleral icterus.  Chest and Lung Exam Chest and lung exam reveals -quiet, even and easy respiratory effort with no use of accessory muscles and on auscultation, normal breath sounds, no adventitious sounds and normal vocal resonance. Inspection Chest Wall - Normal. Back - normal.  Breast - Did not examine.  Cardiovascular Cardiovascular examination reveals -normal heart sounds, regular rate and rhythm with no murmurs and normal pedal pulses bilaterally.  Abdomen Inspection Inspection of the abdomen reveals - No Hernias. Skin - Scar - no surgical scars. Palpation/Percussion Palpation and Percussion of the abdomen reveal - Soft, Non Tender, No Rebound tenderness, No Rigidity (guarding) and No hepatosplenomegaly. Auscultation Auscultation of the abdomen reveals - Bowel sounds normal.  Peripheral Vascular Upper Extremity Palpation - Pulses bilaterally normal.  Neurologic Neurologic evaluation reveals -alert and oriented x 3 with no impairment of recent or remote memory. Mental  Status-Normal.  Neuropsychiatric The patient's mood and affect are described as -normal. Judgment and Insight-insight is appropriate concerning matters relevant to self.  Musculoskeletal Normal Exam - Left-Upper Extremity Strength Normal and Lower Extremity Strength Normal. Normal Exam - Right-Upper Extremity Strength Normal and Lower Extremity Strength Normal.  Lymphatic Head & Neck  General Head & Neck Lymphatics: Bilateral - Description - Normal. Axillary - Did not examine. Femoral & Inguinal - Did not examine.    Assessment & Plan (Kateena Degroote M. Leanndra Pember MD; 05/10/2019 9:50 AM)  SYMPTOMATIC CHOLELITHIASIS (K80.20) Impression: I believe the patient's symptoms are consistent with gallbladder disease.  We discussed gallbladder disease. The patient was given educational material. We discussed non-operative and operative management. We discussed the signs & symptoms of acute cholecystitis  I discussed laparoscopic cholecystectomy with IOC in detail. The patient was given educational material as well as diagrams detailing the procedure. We discussed the risks and benefits of a laparoscopic cholecystectomy including, but not limited to bleeding, infection, injury to surrounding structures such as the intestine or liver, bile leak, retained gallstones, need to convert to an open procedure, prolonged diarrhea, blood clots such as DVT, common bile duct injury, anesthesia risks, and possible need for additional procedures. We discussed the typical post-operative recovery course. I explained that the likelihood of improvement of their symptoms is good.  The patient has elected to proceed with surgery.  This patient encounter took 33 minutes today to perform the following: take history, perform exam, review outside records, interpret imaging, counsel the patient on their diagnosis and document encounter, findings & plan in the EHR  Current Plans Pt Education - Pamphlet Given - Laparoscopic  Gallbladder Surgery: discussed with patient and provided information. You are being scheduled for surgery- Our schedulers will call you.  You should hear from our office's scheduling department within 5 working days about the location, date, and time   of surgery. We try to make accommodations for patient's preferences in scheduling surgery, but sometimes the OR schedule or the surgeon's schedule prevents [place] from making those accommodations.  If you have not heard from our office [phone]) in 5 working days, call the office and ask for your surgeon's nurse.  If you have other questions about your diagnosis, plan, or surgery, call the office and ask for your surgeon's nurse.   HISTORY OF CVA IN ADULTHOOD (Q82.50) Story: 2011 Impression: was due to PFO & possible birth control. PFO closed 2011. therefore I think recurrent stroke risk is very low  Mary Sella. Andrey Campanile, MD, FACS General, Bariatric, & Minimally Invasive Surgery [facility], [place]

## 2019-05-10 NOTE — H&P (Signed)
Tonya Andrews Documented: 05/10/2019 9:08 AM Location: [facility] Patient #: [id number] DOB: 1976/01/21 Married / Language: Tonya Andrews / Race: White Female  History of Present Illness [name] [name] MD; 05/10/2019 9:49 AM) The patient is a 44 year old female who presents for evaluation of gall stones. She is referred by Dr [name] for evaluation of gallstones. She is accompanied by her husband. She reports a multiyear history of stomach issues. She will have episodes of nausea and vomiting or diarrhea along with right-sided abdominal discomfort. It will generally occur after eating it will last for several more hours. As a result she is gradually modified her diet over the past few years. She is now essentially vegetarian. She found that fatty foods, meats would generally trigger her symptoms. She never really stalling anybody about it. About a month ago she had an episode of intense right-sided pain. She states her whole right side was uncomfortable and it wasn't getting any better so she went to urgent care and because she was complaining of more right lower quadrant pain they thought it might be appendicitis to be sent her to the emergency room. She ended up having a pelvic ultrasound which is unremarkable. She had a CT scan which only showed gallstones. She then had a right upper quadrant ultrasound which reviewed numerous gallstones without any evidence of cholecystitis. Her urinalysis, CBC and comprehensive metabolic panel were normal. She was diagnosed with gallstones and discharge from the emergency room. She reports persistent right-sided pain that's fairly constant however it will increase at times generally after eating and will last for a few hours. She points to her right upper quadrant as being more affected at this point. She has a remote history of a stroke around 2011. She had a PFO and was on birth control. Her PFO was closed. She has some residual right upper extremity  issues as a result. She denies any current TIAs or amaurosis fugax or unilateral intermittent numbness or tingling. She denies any chest pain or chest pressure source of breath or dyspnea on exertion. She has some occasional blood in her stool but it's very rare and infrequent. She has lost about 8 pounds over the past month because of the ongoing discomfort. She denies any acholic stools. She denies any family history of GI issues.     Problem List/Past Medical [name] Redmond Pulling, MD; 05/10/2019 9:50 AM) HISTORY OF CVA IN ADULTHOOD (W58.09) 2011 SYMPTOMATIC CHOLELITHIASIS (K80.20)  Past Surgical History Sabino Gasser, CMA; 05/10/2019 9:08 AM) Knee Surgery Right.  Allergies Sabino Gasser, CMA; 05/10/2019 9:09 AM) No Known Drug Allergies [05/10/2019]: Allergies Reconciled  Medication History Sabino Gasser, CMA; 05/10/2019 9:09 AM) [name] XR (150MG  CP24 Sprinkle, Oral) Active. Cyclobenzaprine HCl (10MG  Tablet, Oral) Active. Medications Reconciled  Social History Sabino Gasser, [place]; 05/10/2019 9:08 AM) Tobacco use Never smoker.  Family History Sabino Gasser, [place]; 05/10/2019 9:08 AM) Arthritis Father. Hypertension Mother.  Other Problems [name] Redmond Pulling, MD; 05/10/2019 9:50 AM) Cerebrovascular Accident Cholelithiasis Migraine Headache     Review of Systems [name] [name] MD; 05/10/2019 9:43 AM) General Present- Weight Loss. Not Present- Appetite Loss, Chills, Fatigue, Fever, Night Sweats and Weight Gain. HEENT Present- Seasonal Allergies. Not Present- Earache, Hearing Loss, Hoarseness, Nose Bleed, Oral Ulcers, Ringing in the Ears, Sinus Pain, Sore Throat, Visual Disturbances, Wears glasses/contact lenses and Yellow Eyes. Gastrointestinal Present- Abdominal Pain, Chronic diarrhea, Indigestion, Nausea and Vomiting. Not Present- Bloating, Bloody Stool, Change in Bowel Habits, Constipation, Difficulty Swallowing, Excessive gas, Gets full  quickly at meals, Hemorrhoids and Rectal  Pain. Neurological Present- Headaches and Tingling. Not Present- Decreased Memory, Fainting, Numbness, Seizures, Tremor, Trouble walking and Weakness. All other systems negative  Vitals Maurilio Lovely CMA; 05/10/2019 9:10 AM) 05/10/2019 9:09 AM Weight: 161.4 lb Height: 66in Body Surface Area: 1.83 m Body Mass Index: 26.05 kg/m  Temp.: 97.17F(Tympanic)  Pulse: 131 (Regular)  BP: 112/72 (Sitting, Left Arm, Standard)        Physical Exam Minerva Areola M. Kathrene Sinopoli MD; 05/10/2019 9:43 AM)  General Mental Status-Alert. General Appearance-Consistent with stated age. Hydration-Well hydrated. Voice-Normal.  Head and Neck Head-normocephalic, atraumatic with no lesions or palpable masses. Trachea-midline. Thyroid Gland Characteristics - normal size and consistency.  Eye Eyeball - Bilateral-Extraocular movements intact. Sclera/Conjunctiva - Bilateral-No scleral icterus.  Chest and Lung Exam Chest and lung exam reveals -quiet, even and easy respiratory effort with no use of accessory muscles and on auscultation, normal breath sounds, no adventitious sounds and normal vocal resonance. Inspection Chest Wall - Normal. Back - normal.  Breast - Did not examine.  Cardiovascular Cardiovascular examination reveals -normal heart sounds, regular rate and rhythm with no murmurs and normal pedal pulses bilaterally.  Abdomen Inspection Inspection of the abdomen reveals - No Hernias. Skin - Scar - no surgical scars. Palpation/Percussion Palpation and Percussion of the abdomen reveal - Soft, Non Tender, No Rebound tenderness, No Rigidity (guarding) and No hepatosplenomegaly. Auscultation Auscultation of the abdomen reveals - Bowel sounds normal.  Peripheral Vascular Upper Extremity Palpation - Pulses bilaterally normal.  Neurologic Neurologic evaluation reveals -alert and oriented x 3 with no impairment of recent or remote memory. Mental  Status-Normal.  Neuropsychiatric The patient's mood and affect are described as -normal. Judgment and Insight-insight is appropriate concerning matters relevant to self.  Musculoskeletal Normal Exam - Left-Upper Extremity Strength Normal and Lower Extremity Strength Normal. Normal Exam - Right-Upper Extremity Strength Normal and Lower Extremity Strength Normal.  Lymphatic Head & Neck  General Head & Neck Lymphatics: Bilateral - Description - Normal. Axillary - Did not examine. Femoral & Inguinal - Did not examine.    Assessment & Plan Minerva Areola M. Leathie Weich MD; 05/10/2019 9:50 AM)  SYMPTOMATIC CHOLELITHIASIS (K80.20) Impression: I believe the patient's symptoms are consistent with gallbladder disease.  We discussed gallbladder disease. The patient was given Agricultural engineer. We discussed non-operative and operative management. We discussed the signs & symptoms of acute cholecystitis  I discussed laparoscopic cholecystectomy with IOC in detail. The patient was given educational material as well as diagrams detailing the procedure. We discussed the risks and benefits of a laparoscopic cholecystectomy including, but not limited to bleeding, infection, injury to surrounding structures such as the intestine or liver, bile leak, retained gallstones, need to convert to an open procedure, prolonged diarrhea, blood clots such as DVT, common bile duct injury, anesthesia risks, and possible need for additional procedures. We discussed the typical post-operative recovery course. I explained that the likelihood of improvement of their symptoms is good.  The patient has elected to proceed with surgery.  This patient encounter took 33 minutes today to perform the following: take history, perform exam, review outside records, interpret imaging, counsel the patient on their diagnosis and document encounter, findings & plan in the EHR  Current Plans Pt Education - Pamphlet Given - Laparoscopic  Gallbladder Surgery: discussed with patient and provided information. You are being scheduled for surgery- Our schedulers will call you.  You should hear from our office's scheduling department within 5 working days about the location, date, and time  of surgery. We try to make accommodations for patient's preferences in scheduling surgery, but sometimes the OR schedule or the surgeon's schedule prevents [place] from making those accommodations.  If you have not heard from our office [phone]) in 5 working days, call the office and ask for your surgeon's nurse.  If you have other questions about your diagnosis, plan, or surgery, call the office and ask for your surgeon's nurse.   HISTORY OF CVA IN ADULTHOOD (Q82.50) Story: 2011 Impression: was due to PFO & possible birth control. PFO closed 2011. therefore I think recurrent stroke risk is very low  Mary Sella. Andrey Campanile, MD, FACS General, Bariatric, & Minimally Invasive Surgery [facility], [place]

## 2019-05-24 ENCOUNTER — Encounter (HOSPITAL_COMMUNITY): Payer: Self-pay

## 2019-05-24 NOTE — Patient Instructions (Addendum)
DUE TO COVID-19 ONLY ONE VISITOR ARE ALLOWED TO COME WITH YOU AND STAY IN THE WAITING ROOM ONLY DURING PRE OP AND PROCEDURE. THEN TWO VISITORS MAY VISIT WITH YOU IN YOUR PRIVATE ROOM DURING VISITING HOURS ONLY!!   COVID SWAB TESTING MUST BE COMPLETED ON:   Friday, May 26, 2019 at 10:15 AM [address], [place] -[facility] enter pre surgical testing line (Must self quarantine after testing. Follow instructions on handout.)             Your procedure is scheduled on: Tuesday, May 30, 2019    Report to [facility] Main  Entrance    Report to admitting at 9:00 AM   Call this number if you have problems the morning of surgery [phone]   Do not eat food:After Midnight.   May have liquids until 8:00 AM day of surgery   CLEAR LIQUID DIET  Foods Allowed                                                                     Foods Excluded  Water, Black Coffee and tea, regular and decaf                             liquids that you cannot  Plain Jell-O in any flavor  (No red)                                           see through such as: Fruit ices (not with fruit pulp)                                     milk, soups, orange juice  Iced Popsicles (No red)                                    All solid food Carbonated beverages, regular and diet                                    Apple juices Sports drinks like Gatorade (No red) Lightly seasoned clear broth or consume(fat free) Sugar, honey syrup  Sample Menu Breakfast                                Lunch                                     Supper Cranberry juice                    Beef broth                            Chicken broth Jell-O  Grape juice                           Apple juice Coffee or tea                        Jell-O                                      Popsicle                                                Coffee or tea                        Coffee  or tea   Complete one Ensure drink the morning of surgery at 8:00 AM the day of surgery.   Oral Hygiene is also important to reduce your risk of infection.                                    Remember - BRUSH YOUR TEETH THE MORNING OF SURGERY WITH YOUR REGULAR TOOTHPASTE   Do NOT smoke after Midnight   Take these medicines the morning of surgery with A SIP OF WATER: None   May use Flonase day of surgery                               You may not have any metal on your body including hair pins, jewelry, and body piercings             Do not wear make-up, lotions, powders, perfumes/cologne, or deodorant             Do not wear nail polish.  Do not shave  48 hours prior to surgery.                Do not bring valuables to the hospital. [place].   Contacts, dentures or bridgework may not be worn into surgery.    Patients discharged the day of surgery will not be allowed to drive home.   Special Instructions: Bring a copy of your healthcare power of attorney and living will documents         the day of surgery if you haven't scanned them in before.              Please read over the following fact sheets you were given: IF YOU HAVE QUESTIONS ABOUT YOUR PRE OP INSTRUCTIONS PLEASE CALL [phone]   [facility] - Preparing for Surgery Before surgery, you can play an important role.  Because skin is not sterile, your skin needs to be as free of germs as possible.  You can reduce the number of germs on your skin by washing with CHG (chlorahexidine gluconate) soap before surgery.  CHG is an antiseptic cleaner which kills germs and bonds with the skin to continue killing germs even after washing. Please DO NOT use if you have an allergy to  CHG or antibacterial soaps.  If your skin becomes reddened/irritated stop using the CHG and inform your nurse when you arrive at Short Stay. Do not shave (including legs and underarms) for at least 48 hours  prior to the first CHG shower.  You may shave your face/neck.  Please follow these instructions carefully:  1.  Shower with CHG Soap the night before surgery and the  morning of surgery.  2.  If you choose to wash your hair, wash your hair first as usual with your normal  shampoo.  3.  After you shampoo, rinse your hair and body thoroughly to remove the shampoo.                             4.  Use CHG as you would any other liquid soap.  You can apply chg directly to the skin and wash.  Gently with a scrungie or clean washcloth.  5.  Apply the CHG Soap to your body ONLY FROM THE NECK DOWN.   Do   not use on face/ open                           Wound or open sores. Avoid contact with eyes, ears mouth and   genitals (private parts).                       Wash face,  Genitals (private parts) with your normal soap.             6.  Wash thoroughly, paying special attention to the area where your    surgery  will be performed.  7.  Thoroughly rinse your body with warm water from the neck down.  8.  DO NOT shower/wash with your normal soap after using and rinsing off the CHG Soap.                9.  Pat yourself dry with a clean towel.            10.  Wear clean pajamas.            11.  Place clean sheets on your bed the night of your first shower and do not  sleep with pets. Day of Surgery : Do not apply any lotions/deodorants the morning of surgery.  Please wear clean clothes to the hospital/surgery center.  FAILURE TO FOLLOW THESE INSTRUCTIONS MAY RESULT IN THE CANCELLATION OF YOUR SURGERY  PATIENT SIGNATURE_________________________________  NURSE SIGNATURE__________________________________  ________________________________________________________________________

## 2019-05-25 ENCOUNTER — Encounter (HOSPITAL_COMMUNITY)
Admission: RE | Admit: 2019-05-25 | Discharge: 2019-05-25 | Disposition: A | Discharge status: Home / Self Care | Payer: BC Managed Care – PPO | Source: Ambulatory Visit | Attending: General Surgery | Admitting: General Surgery

## 2019-05-25 ENCOUNTER — Encounter (HOSPITAL_COMMUNITY): Payer: Self-pay

## 2019-05-25 ENCOUNTER — Other Ambulatory Visit: Payer: Self-pay

## 2019-05-25 DIAGNOSIS — Z01812 Encounter for preprocedural laboratory examination: Secondary | ICD-10-CM | POA: Insufficient documentation

## 2019-05-25 HISTORY — DX: Other specified postprocedural states: Z98.890

## 2019-05-25 HISTORY — DX: Calculus of gallbladder without cholecystitis without obstruction: K80.20

## 2019-05-25 HISTORY — DX: Other specified postprocedural states: R11.2

## 2019-05-25 HISTORY — DX: Neuralgia and neuritis, unspecified: M79.2

## 2019-05-25 HISTORY — DX: Other seasonal allergic rhinitis: J30.2

## 2019-05-25 LAB — CBC
HCT: 42.1 % (ref 36.0–46.0)
Hemoglobin: 13.9 g/dL (ref 12.0–15.0)
MCH: 31.1 pg (ref 26.0–34.0)
MCHC: 33 g/dL (ref 30.0–36.0)
MCV: 94.2 fL (ref 80.0–100.0)
Platelets: 291 10*3/uL (ref 150–400)
RBC: 4.47 MIL/uL (ref 3.87–5.11)
RDW: 13.1 % (ref 11.5–15.5)
WBC: 7.2 10*3/uL (ref 4.0–10.5)
nRBC: 0 % (ref 0.0–0.2)

## 2019-05-25 NOTE — Progress Notes (Signed)
PCP - Dr. Michaelle Birks Cardiologist - Dr. Dayle Points last office visit 2013  Chest x-ray - greater than 1 year EKG - greater than 1 year Stress Test - N/A ECHO - greater than 2 years Cardiac Cath - N/A  Sleep Study - N/A CPAP - N/A  Fasting Blood Sugar - N/A Checks Blood Sugar _N/A____ times a day  Blood Thinner Instructions: N/A Aspirin Instructions: N/A Last Dose: N/A  Anesthesia review: History of PFO with closure, Stroke x2  Patient denies shortness of breath, fever, cough and chest pain at PAT appointment   Patient verbalized understanding of instructions that were given to them at the PAT appointment. Patient was also instructed that they will need to review over the PAT instructions again at home before surgery.

## 2019-05-26 ENCOUNTER — Other Ambulatory Visit (HOSPITAL_COMMUNITY)
Admission: RE | Admit: 2019-05-26 | Discharge: 2019-05-26 | Disposition: A | Discharge status: Home / Self Care | Payer: BC Managed Care – PPO | Source: Ambulatory Visit | Attending: General Surgery | Admitting: General Surgery

## 2019-05-26 DIAGNOSIS — Z20822 Contact with and (suspected) exposure to covid-19: Secondary | ICD-10-CM | POA: Insufficient documentation

## 2019-05-26 DIAGNOSIS — Z01812 Encounter for preprocedural laboratory examination: Secondary | ICD-10-CM | POA: Diagnosis present

## 2019-05-26 LAB — SARS CORONAVIRUS 2 (TAT 6-24 HRS): SARS Coronavirus 2: NEGATIVE

## 2019-05-30 ENCOUNTER — Other Ambulatory Visit: Payer: Self-pay

## 2019-05-30 ENCOUNTER — Encounter (HOSPITAL_COMMUNITY): Payer: Self-pay | Admitting: General Surgery

## 2019-05-30 ENCOUNTER — Ambulatory Visit (HOSPITAL_COMMUNITY)
Admission: RE | Admit: 2019-05-30 | Discharge: 2019-05-30 | Disposition: A | Discharge status: Home / Self Care | Payer: BC Managed Care – PPO | Attending: General Surgery | Admitting: General Surgery

## 2019-05-30 ENCOUNTER — Encounter (HOSPITAL_COMMUNITY)
Admission: RE | Disposition: A | Discharge status: Home / Self Care | Payer: Self-pay | Source: Home / Self Care | Attending: General Surgery

## 2019-05-30 ENCOUNTER — Ambulatory Visit (HOSPITAL_COMMUNITY): Payer: BC Managed Care – PPO | Admitting: Anesthesiology

## 2019-05-30 ENCOUNTER — Ambulatory Visit (HOSPITAL_COMMUNITY): Payer: BC Managed Care – PPO | Admitting: Physician Assistant

## 2019-05-30 DIAGNOSIS — G43909 Migraine, unspecified, not intractable, without status migrainosus: Secondary | ICD-10-CM | POA: Diagnosis not present

## 2019-05-30 DIAGNOSIS — K8010 Calculus of gallbladder with chronic cholecystitis without obstruction: Secondary | ICD-10-CM | POA: Insufficient documentation

## 2019-05-30 DIAGNOSIS — Z79899 Other long term (current) drug therapy: Secondary | ICD-10-CM | POA: Insufficient documentation

## 2019-05-30 DIAGNOSIS — I69351 Hemiplegia and hemiparesis following cerebral infarction affecting right dominant side: Secondary | ICD-10-CM | POA: Diagnosis not present

## 2019-05-30 DIAGNOSIS — K8020 Calculus of gallbladder without cholecystitis without obstruction: Secondary | ICD-10-CM | POA: Diagnosis present

## 2019-05-30 HISTORY — PX: CHOLECYSTECTOMY: SHX55

## 2019-05-30 LAB — HCG, SERUM, QUALITATIVE: Preg, Serum: NEGATIVE

## 2019-05-30 SURGERY — LAPAROSCOPIC CHOLECYSTECTOMY WITH INTRAOPERATIVE CHOLANGIOGRAM
Anesthesia: General | Site: Abdomen

## 2019-05-30 MED ORDER — ONDANSETRON HCL 4 MG/2ML IJ SOLN
INTRAMUSCULAR | Status: DC | PRN
  Administered 2019-05-30: 4 mg via INTRAVENOUS

## 2019-05-30 MED ORDER — ACETAMINOPHEN 500 MG PO TABS
1000.0000 mg | ORAL_TABLET | Freq: Three times a day (TID) | ORAL | 0 refills | Status: AC
Start: 2019-05-30 — End: 2019-06-04

## 2019-05-30 MED ORDER — KETOROLAC TROMETHAMINE 30 MG/ML IJ SOLN
INTRAMUSCULAR | Status: AC
  Filled 2019-05-30: qty 1

## 2019-05-30 MED ORDER — BUPIVACAINE HCL (PF) 0.25 % IJ SOLN
INTRAMUSCULAR | Status: AC
  Filled 2019-05-30: qty 30

## 2019-05-30 MED ORDER — DEXAMETHASONE SODIUM PHOSPHATE 10 MG/ML IJ SOLN
INTRAMUSCULAR | Status: DC | PRN
  Administered 2019-05-30: 6 mg via INTRAVENOUS

## 2019-05-30 MED ORDER — PHENYLEPHRINE HCL (PRESSORS) 10 MG/ML IV SOLN
INTRAVENOUS | Status: AC
  Filled 2019-05-30: qty 1

## 2019-05-30 MED ORDER — ENSURE PRE-SURGERY PO LIQD
296.0000 mL | Freq: Once | ORAL | Status: DC
  Filled 2019-05-30: qty 296

## 2019-05-30 MED ORDER — OXYCODONE HCL 5 MG PO TABS
5.0000 mg | ORAL_TABLET | Freq: Four times a day (QID) | ORAL | 0 refills | Status: DC | PRN
Start: 2019-05-30 — End: 2020-04-29

## 2019-05-30 MED ORDER — MIDAZOLAM HCL 5 MG/5ML IJ SOLN
INTRAMUSCULAR | Status: DC | PRN
  Administered 2019-05-30: 2 mg via INTRAVENOUS

## 2019-05-30 MED ORDER — SODIUM CHLORIDE 0.9 % IV SOLN
2.0000 g | INTRAVENOUS | Status: AC
  Administered 2019-05-30: 2 g via INTRAVENOUS
  Filled 2019-05-30: qty 2

## 2019-05-30 MED ORDER — DEXAMETHASONE SODIUM PHOSPHATE 10 MG/ML IJ SOLN
INTRAMUSCULAR | Status: AC
  Filled 2019-05-30: qty 1

## 2019-05-30 MED ORDER — FENTANYL CITRATE (PF) 100 MCG/2ML IJ SOLN
INTRAMUSCULAR | Status: AC
  Administered 2019-05-30: 50 ug via INTRAVENOUS
  Filled 2019-05-30: qty 2

## 2019-05-30 MED ORDER — ROCURONIUM BROMIDE 10 MG/ML (PF) SYRINGE
PREFILLED_SYRINGE | INTRAVENOUS | Status: AC
  Filled 2019-05-30: qty 10

## 2019-05-30 MED ORDER — BUPIVACAINE HCL (PF) 0.25 % IJ SOLN
INTRAMUSCULAR | Status: DC | PRN
  Administered 2019-05-30: 30 mL

## 2019-05-30 MED ORDER — LIDOCAINE 2% (20 MG/ML) 5 ML SYRINGE
INTRAMUSCULAR | Status: AC
  Filled 2019-05-30: qty 5

## 2019-05-30 MED ORDER — ACETAMINOPHEN 500 MG PO TABS
1000.0000 mg | ORAL_TABLET | ORAL | Status: AC
  Administered 2019-05-30: 1000 mg via ORAL
  Filled 2019-05-30: qty 2

## 2019-05-30 MED ORDER — PROMETHAZINE HCL 25 MG/ML IJ SOLN
6.2500 mg | INTRAMUSCULAR | Status: AC | PRN
  Administered 2019-05-30: 12.5 mg via INTRAVENOUS

## 2019-05-30 MED ORDER — FENTANYL CITRATE (PF) 250 MCG/5ML IJ SOLN
INTRAMUSCULAR | Status: AC
  Filled 2019-05-30: qty 5

## 2019-05-30 MED ORDER — PROPOFOL 10 MG/ML IV BOLUS
INTRAVENOUS | Status: DC | PRN
  Administered 2019-05-30: 180 mg via INTRAVENOUS

## 2019-05-30 MED ORDER — CHLORHEXIDINE GLUCONATE CLOTH 2 % EX PADS: 6.0000 | MEDICATED_PAD | Freq: Once | CUTANEOUS | Status: DC

## 2019-05-30 MED ORDER — GABAPENTIN 300 MG PO CAPS
300.0000 mg | ORAL_CAPSULE | ORAL | Status: AC
  Administered 2019-05-30: 300 mg via ORAL
  Filled 2019-05-30: qty 1

## 2019-05-30 MED ORDER — LACTATED RINGERS IV SOLN: INTRAVENOUS | Status: DC

## 2019-05-30 MED ORDER — PROPOFOL 500 MG/50ML IV EMUL
INTRAVENOUS | Status: DC | PRN
Start: 2019-05-30 — End: 2019-05-30
  Administered 2019-05-30: 125 ug/kg/min via INTRAVENOUS

## 2019-05-30 MED ORDER — 0.9 % SODIUM CHLORIDE (POUR BTL) OPTIME
TOPICAL | Status: DC | PRN
  Administered 2019-05-30: 1000 mL

## 2019-05-30 MED ORDER — FENTANYL CITRATE (PF) 100 MCG/2ML IJ SOLN: 25.0000 ug | INTRAMUSCULAR | Status: DC | PRN

## 2019-05-30 MED ORDER — KETAMINE HCL 10 MG/ML IJ SOLN
INTRAMUSCULAR | Status: DC | PRN
  Administered 2019-05-30: 30 mg via INTRAVENOUS

## 2019-05-30 MED ORDER — ONDANSETRON HCL 4 MG/2ML IJ SOLN
INTRAMUSCULAR | Status: AC
  Filled 2019-05-30: qty 2

## 2019-05-30 MED ORDER — PHENYLEPHRINE 40 MCG/ML (10ML) SYRINGE FOR IV PUSH (FOR BLOOD PRESSURE SUPPORT)
PREFILLED_SYRINGE | INTRAVENOUS | Status: DC | PRN
  Administered 2019-05-30 (×3): 80 ug via INTRAVENOUS

## 2019-05-30 MED ORDER — PROMETHAZINE HCL 25 MG/ML IJ SOLN
INTRAMUSCULAR | Status: AC
  Administered 2019-05-30: 12.5 mg via INTRAVENOUS
  Filled 2019-05-30: qty 1

## 2019-05-30 MED ORDER — PROPOFOL 10 MG/ML IV BOLUS
INTRAVENOUS | Status: AC
  Filled 2019-05-30: qty 20

## 2019-05-30 MED ORDER — LACTATED RINGERS IR SOLN
Status: DC | PRN
  Administered 2019-05-30: 1000 mL

## 2019-05-30 MED ORDER — SCOPOLAMINE 1 MG/3DAYS TD PT72
1.0000 | MEDICATED_PATCH | TRANSDERMAL | Status: DC
  Administered 2019-05-30: 1.5 mg via TRANSDERMAL
  Filled 2019-05-30: qty 1

## 2019-05-30 MED ORDER — SUGAMMADEX SODIUM 200 MG/2ML IV SOLN
INTRAVENOUS | Status: DC | PRN
  Administered 2019-05-30: 200 mg via INTRAVENOUS

## 2019-05-30 MED ORDER — KETOROLAC TROMETHAMINE 30 MG/ML IJ SOLN
INTRAMUSCULAR | Status: DC | PRN
  Administered 2019-05-30: 30 mg via INTRAVENOUS

## 2019-05-30 MED ORDER — FENTANYL CITRATE (PF) 250 MCG/5ML IJ SOLN
INTRAMUSCULAR | Status: DC | PRN
  Administered 2019-05-30: 50 ug via INTRAVENOUS
  Administered 2019-05-30: 100 ug via INTRAVENOUS

## 2019-05-30 MED ORDER — PROPOFOL 1000 MG/100ML IV EMUL
INTRAVENOUS | Status: AC
  Filled 2019-05-30: qty 100

## 2019-05-30 MED ORDER — LIDOCAINE 2% (20 MG/ML) 5 ML SYRINGE
INTRAMUSCULAR | Status: DC | PRN
  Administered 2019-05-30: 60 mg via INTRAVENOUS

## 2019-05-30 MED ORDER — MIDAZOLAM HCL 2 MG/2ML IJ SOLN
INTRAMUSCULAR | Status: AC
  Filled 2019-05-30: qty 2

## 2019-05-30 MED ORDER — ROCURONIUM BROMIDE 50 MG/5ML IV SOSY
PREFILLED_SYRINGE | INTRAVENOUS | Status: DC | PRN
  Administered 2019-05-30: 60 mg via INTRAVENOUS
  Administered 2019-05-30: 10 mg via INTRAVENOUS

## 2019-05-30 SURGICAL SUPPLY — 61 items
ADH SKN CLS APL DERMABOND .7 (GAUZE/BANDAGES/DRESSINGS) ×1
APL PRP STRL LF DISP 70% ISPRP (MISCELLANEOUS) ×1
APL SKNCLS STERI-STRIP NONHPOA (GAUZE/BANDAGES/DRESSINGS)
APL SRG 38 LTWT LNG FL B (MISCELLANEOUS)
APPLICATOR ARISTA FLEXITIP XL (MISCELLANEOUS) IMPLANT
APPLIER CLIP 5 13 M/L LIGAMAX5 (MISCELLANEOUS) ×2
APPLIER CLIP ROT 10 11.4 M/L (STAPLE)
APR CLP MED LRG 11.4X10 (STAPLE)
APR CLP MED LRG 5 ANG JAW (MISCELLANEOUS) ×1
BAG SPEC RTRVL 10 TROC 200 (ENDOMECHANICALS) ×1
BENZOIN TINCTURE PRP APPL 2/3 (GAUZE/BANDAGES/DRESSINGS) IMPLANT
CABLE HIGH FREQUENCY MONO STRZ (ELECTRODE) ×1 IMPLANT
CHLORAPREP W/TINT 26 (MISCELLANEOUS) ×2 IMPLANT
CLIP APPLIE 5 13 M/L LIGAMAX5 (MISCELLANEOUS) IMPLANT
CLIP APPLIE ROT 10 11.4 M/L (STAPLE) IMPLANT
CLIP VESOLOCK MED LG 6/CT (CLIP) IMPLANT
COVER MAYO STAND STRL (DRAPES) IMPLANT
COVER SURGICAL LIGHT HANDLE (MISCELLANEOUS) ×2 IMPLANT
COVER WAND RF STERILE (DRAPES) IMPLANT
DECANTER SPIKE VIAL GLASS SM (MISCELLANEOUS) ×2 IMPLANT
DERMABOND ADVANCED (GAUZE/BANDAGES/DRESSINGS) ×1
DERMABOND ADVANCED .7 DNX12 (GAUZE/BANDAGES/DRESSINGS) IMPLANT
DRAPE C-ARM 42X120 X-RAY (DRAPES) IMPLANT
DRSG TEGADERM 2-3/8X2-3/4 SM (GAUZE/BANDAGES/DRESSINGS) IMPLANT
ELECT PENCIL ROCKER SW 15FT (MISCELLANEOUS) ×1 IMPLANT
ELECT REM PT RETURN 15FT ADLT (MISCELLANEOUS) ×2 IMPLANT
GAUZE SPONGE 2X2 8PLY STRL LF (GAUZE/BANDAGES/DRESSINGS) IMPLANT
GLOVE BIOGEL PI IND STRL 6.5 (GLOVE) IMPLANT
GLOVE BIOGEL PI IND STRL 7.0 (GLOVE) IMPLANT
GLOVE BIOGEL PI INDICATOR 6.5 (GLOVE) ×1
GLOVE BIOGEL PI INDICATOR 7.0 (GLOVE) ×1
GLOVE SURG SS PI 7.0 STRL IVOR (GLOVE) ×1 IMPLANT
GLOVE SURG SS PI 7.5 STRL IVOR (GLOVE) ×1 IMPLANT
GOWN STRL REUS W/TWL XL LVL3 (GOWN DISPOSABLE) ×6 IMPLANT
GRASPER SUT TROCAR 14GX15 (MISCELLANEOUS) IMPLANT
HEMOSTAT ARISTA ABSORB 3G PWDR (HEMOSTASIS) IMPLANT
HEMOSTAT SNOW SURGICEL 2X4 (HEMOSTASIS) IMPLANT
HEMOSTAT SURGICEL 2X4 FIBR (HEMOSTASIS) ×1 IMPLANT
KIT BASIN (CUSTOM PROCEDURE TRAY) ×2 IMPLANT
KIT TURNOVER KIT A (KITS) IMPLANT
L-HOOK LAP DISP 36CM (ELECTROSURGICAL) ×2
LHOOK LAP DISP 36CM (ELECTROSURGICAL) IMPLANT
POUCH RETRIEVAL ECOSAC 10 (ENDOMECHANICALS) ×1 IMPLANT
POUCH RETRIEVAL ECOSAC 10MM (ENDOMECHANICALS) ×2
SCISSORS LAP 5X35 DISP (ENDOMECHANICALS) ×2 IMPLANT
SET CHOLANGIOGRAPH MIX (MISCELLANEOUS) IMPLANT
SET IRRIG TUBING LAPAROSCOPIC (IRRIGATION / IRRIGATOR) ×2 IMPLANT
SET TUBE SMOKE EVAC HIGH FLOW (TUBING) ×2 IMPLANT
SLEEVE XCEL OPT CAN 5 100 (ENDOMECHANICALS) ×4 IMPLANT
SPONGE GAUZE 2X2 STER 10/PKG (GAUZE/BANDAGES/DRESSINGS)
STRIP CLOSURE SKIN 1/2X4 (GAUZE/BANDAGES/DRESSINGS) IMPLANT
SUT MNCRL AB 4-0 PS2 18 (SUTURE) ×2 IMPLANT
SUT VIC AB 0 UR5 27 (SUTURE) IMPLANT
SUT VICRYL 0 TIES 12 18 (SUTURE) IMPLANT
SUT VICRYL 0 UR6 27IN ABS (SUTURE) ×1 IMPLANT
TOWEL OR 17X26 10 PK STRL BLUE (TOWEL DISPOSABLE) ×2 IMPLANT
TOWEL OR NON WOVEN STRL DISP B (DISPOSABLE) ×2 IMPLANT
TRAY LAPAROSCOPIC (CUSTOM PROCEDURE TRAY) ×2 IMPLANT
TROCAR BLADELESS OPT 5 100 (ENDOMECHANICALS) ×2 IMPLANT
TROCAR XCEL BLUNT TIP 100MML (ENDOMECHANICALS) ×1 IMPLANT
TROCAR XCEL NON-BLD 11X100MML (ENDOMECHANICALS) IMPLANT

## 2019-05-30 NOTE — Interval H&P Note (Signed)
History and Physical Interval Note:  05/30/2019 10:30 AM  Tonya Andrews  has presented today for surgery, with the diagnosis of SYMPTOMATIC CHOLELITHIASIS.  The various methods of treatment have been discussed with the patient and family. After consideration of risks, benefits and other options for treatment, the patient has consented to  Procedure(s): LAPAROSCOPIC CHOLECYSTECTOMY WITH POSSIBLE INTRAOPERATIVE CHOLANGIOGRAM (N/A) as a surgical intervention.  The patient's history has been reviewed, patient examined, no change in status, stable for surgery.  I have reviewed the patient's chart and labs.  Questions were answered to the patient's satisfaction.    Mary Sella. Andrey Campanile, MD, FACS General, Bariatric, & Minimally Invasive Surgery [facility]  Gaynelle Adu

## 2019-05-30 NOTE — Anesthesia Procedure Notes (Signed)
Procedure Name: Intubation Date/Time: 05/30/2019 10:55 AM Performed by: Maxwell Caul, CRNA Pre-anesthesia Checklist: Patient identified, Emergency Drugs available, Suction available and Patient being monitored Patient Re-evaluated:Patient Re-evaluated prior to induction Oxygen Delivery Method: Circle system utilized Preoxygenation: Pre-oxygenation with 100% oxygen Induction Type: IV induction Ventilation: Mask ventilation without difficulty Laryngoscope Size: Mac and 4 Grade View: Grade I Tube type: Oral Tube size: 7.5 mm Number of attempts: 1 Airway Equipment and Method: Stylet Placement Confirmation: ETT inserted through vocal cords under direct vision,  positive ETCO2 and breath sounds checked- equal and bilateral Secured at: 21 cm Tube secured with: Tape Dental Injury: Teeth and Oropharynx as per pre-operative assessment

## 2019-05-30 NOTE — Anesthesia Preprocedure Evaluation (Addendum)
Anesthesia Evaluation  Patient identified by MRN, date of birth, ID band Patient awake    Reviewed: Allergy & Precautions, NPO status , Patient's Chart, lab work & pertinent test results  History of Anesthesia Complications (+) PONV and history of anesthetic complications  Airway Mallampati: II  TM Distance: >3 FB Neck ROM: Full    Dental no notable dental hx. (+) Dental Advisory Given   Pulmonary neg pulmonary ROS,    Pulmonary exam normal        Cardiovascular negative cardio ROS Normal cardiovascular exam     Neuro/Psych  Headaches, CVA, Residual Symptoms    GI/Hepatic negative GI ROS, Neg liver ROS,   Endo/Other  negative endocrine ROS  Renal/GU negative Renal ROS     Musculoskeletal negative musculoskeletal ROS (+)   Abdominal   Peds  Hematology negative hematology ROS (+)   Anesthesia Other Findings Day of surgery medications reviewed with the patient.  Reproductive/Obstetrics                            Anesthesia Physical Anesthesia Plan  ASA: II  Anesthesia Plan: General   Post-op Pain Management:    Induction: Intravenous  PONV Risk Score and Plan: 4 or greater and Ondansetron, Dexamethasone, Midazolam, Scopolamine patch - Pre-op and TIVA  Airway Management Planned: Oral ETT  Additional Equipment:   Intra-op Plan:   Post-operative Plan: Extubation in OR  Informed Consent: I have reviewed the patients History and Physical, chart, labs and discussed the procedure including the risks, benefits and alternatives for the proposed anesthesia with the patient or authorized representative who has indicated his/her understanding and acceptance.     Dental advisory given  Plan Discussed with: Anesthesiologist and CRNA  Anesthesia Plan Comments:       Anesthesia Quick Evaluation

## 2019-05-30 NOTE — Discharge Instructions (Signed)
[facility] LAPAROSCOPIC SURGERY: POST OP INSTRUCTIONS Always review your discharge instruction sheet given to you by the facility where your surgery was performed. IF YOU HAVE DISABILITY OR FAMILY LEAVE FORMS, YOU MUST BRING THEM TO THE OFFICE FOR PROCESSING.   DO NOT GIVE THEM TO YOUR DOCTOR.  PAIN CONTROL  1. First take acetaminophen (Tylenol) AND/or ibuprofen (Advil) to control your pain after surgery.  Follow directions on package.  Taking acetaminophen (Tylenol) and/or ibuprofen (Advil) regularly after surgery will help to control your pain and lower the amount of prescription pain medication you may need.  You should not take more than 3,000 mg (3 grams) of acetaminophen (Tylenol) in 24 hours.  You should not take ibuprofen (Advil), aleve, motrin, naprosyn or other NSAIDS if you have a history of stomach ulcers or chronic kidney disease.  2. A prescription for pain medication may be given to you upon discharge.  Take your pain medication as prescribed, if you still have uncontrolled pain after taking acetaminophen (Tylenol) or ibuprofen (Advil). 3. Use ice packs to help control pain. 4. If you need a refill on your pain medication, please contact your pharmacy.  They will contact our office to request authorization. Prescriptions will not be filled after 5pm or on week-ends.  HOME MEDICATIONS 5. Take your usually prescribed medications unless otherwise directed.  DIET 6. You should follow a light diet the first few days after arrival home.  Be sure to include lots of fluids daily. Avoid fatty, fried foods.   CONSTIPATION 7. It is common to experience some constipation after surgery and if you are taking pain medication.  Increasing fluid intake and taking a stool softener (such as Colace) will usually help or prevent this problem from occurring.  A mild laxative (Milk of Magnesia or Miralax) should be taken according to package instructions if there are no bowel  movements after 48 hours.  WOUND/INCISION CARE 8. Most patients will experience some swelling and bruising in the area of the incisions.  Ice packs will help.  Swelling and bruising can take several days to resolve.  9. Unless discharge instructions indicate otherwise, follow guidelines below  a. STERI-STRIPS - you may remove your outer bandages 48 hours after surgery, and you may shower at that time.  You have steri-strips (small skin tapes) in place directly over the incision.  These strips should be left on the skin for 7-10 days.   b. DERMABOND/SKIN GLUE - you may shower in 24 hours.  The glue will flake off over the next 2-3 weeks. 10. Any sutures or staples will be removed at the office during your follow-up visit.  ACTIVITIES 11. You may resume regular (light) daily activities beginning the next day--such as daily self-care, walking, climbing stairs--gradually increasing activities as tolerated.  You may have sexual intercourse when it is comfortable.  Refrain from any heavy lifting or straining until approved by your doctor. a. You may drive when you are no longer taking prescription pain medication, you can comfortably wear a seatbelt, and you can safely maneuver your car and apply brakes.  FOLLOW-UP 12. You should see your doctor in the office for a follow-up appointment approximately 2-3 weeks after your surgery.  You should have been given your post-op/follow-up appointment when your surgery was scheduled.  If you did not receive a post-op/follow-up appointment, make sure that you call for this appointment within a day or two after you arrive home to insure a convenient appointment time.  OTHER   INSTRUCTIONS 13.   WHEN TO CALL YOUR DOCTOR: 1. Fever over 101.0 2. Inability to urinate 3. Continued bleeding from incision. 4. Increased pain, redness, or drainage from the incision. 5. Increasing abdominal pain  The clinic staff is available to answer your questions during regular  business hours.  Please don't hesitate to call and ask to speak to one of the nurses for clinical concerns.  If you have a medical emergency, go to the nearest emergency room or call 911.  A surgeon from [facility] is always on call at the hospital. [address], [address], [place], [place]  [address] ? [address] [address], , [place]   [address] [phone] ? [phone] ? FAX [phone] Web site: [email or web address]  .........   Managing Your Pain After Surgery Without Opioids    Thank you for participating in our program to help patients manage their pain after surgery without opioids. This is part of our effort to provide you with the best care possible, without exposing you or your family to the risk that opioids pose.  What pain can I expect after surgery? You can expect to have some pain after surgery. This is normal. The pain is typically worse the day after surgery, and quickly begins to get better. Many studies have found that many patients are able to manage their pain after surgery with Over-the-Counter (OTC) medications such as Tylenol and Motrin. If you have a condition that does not allow you to take Tylenol or Motrin, notify your surgical team.  How will I manage my pain? The best strategy for controlling your pain after surgery is around the clock pain control with Tylenol (acetaminophen) and Motrin (ibuprofen or Advil). Alternating these medications with each other allows you to maximize your pain control. In addition to Tylenol and Motrin, you can use heating pads or ice packs on your incisions to help reduce your pain.  How will I alternate your regular strength over-the-counter pain medication? You will take a dose of pain medication every three hours. ; Start by taking 650 mg of Tylenol (2 pills of 325 mg) ; 3 hours later take 600 mg of Motrin (3 pills of 200 mg) ; 3 hours after taking the Motrin take 650 mg of Tylenol ; 3 hours  after that take 600 mg of Motrin.   - 1 -  See example - if your first dose of Tylenol is at 12:00 PM   12:00 PM Tylenol 650 mg (2 pills of 325 mg)  3:00 PM Motrin 600 mg (3 pills of 200 mg)  6:00 PM Tylenol 650 mg (2 pills of 325 mg)  9:00 PM Motrin 600 mg (3 pills of 200 mg)  Continue alternating every 3 hours   We recommend that you follow this schedule around-the-clock for at least 3 days after surgery, or until you feel that it is no longer needed. Use the table on the last page of this handout to keep track of the medications you are taking. Important: Do not take more than 3000mg of Tylenol or 1800mg of Motrin in a 24-hour period. Do not take ibuprofen/Motrin if you have a history of bleeding stomach ulcers, severe kidney disease, &/or actively taking a blood thinner  What if I still have pain? If you have pain that is not controlled with the over-the-counter pain medications (Tylenol and Motrin or Advil) you might have what we call "breakthrough" pain. You will receive a prescription for a small amount of an opioid   pain medication such as Oxycodone, Tramadol, or Tylenol with Codeine. Use these opioid pills in the first 24 hours after surgery if you have breakthrough pain. Do not take more than 1 pill every 4-6 hours.  If you still have uncontrolled pain after using all opioid pills, don't hesitate to call our staff using the number provided. We will help make sure you are managing your pain in the best way possible, and if necessary, we can provide a prescription for additional pain medication.   Day 1    Time  Name of Medication Number of pills taken  Amount of Acetaminophen  Pain Level   Comments  AM PM       AM PM       AM PM       AM PM       AM PM       AM PM       AM PM       AM PM       Total Daily amount of Acetaminophen Do not take more than  3,000 mg per day      Day 2    Time  Name of Medication Number of pills taken  Amount of Acetaminophen  Pain  Level   Comments  AM PM       AM PM       AM PM       AM PM       AM PM       AM PM       AM PM       AM PM       Total Daily amount of Acetaminophen Do not take more than  3,000 mg per day      Day 3    Time  Name of Medication Number of pills taken  Amount of Acetaminophen  Pain Level   Comments  AM PM       AM PM       AM PM       AM PM          AM PM       AM PM       AM PM       AM PM       Total Daily amount of Acetaminophen Do not take more than  3,000 mg per day      Day 4    Time  Name of Medication Number of pills taken  Amount of Acetaminophen  Pain Level   Comments  AM PM       AM PM       AM PM       AM PM       AM PM       AM PM       AM PM       AM PM       Total Daily amount of Acetaminophen Do not take more than  3,000 mg per day      Day 5    Time  Name of Medication Number of pills taken  Amount of Acetaminophen  Pain Level   Comments  AM PM       AM PM       AM PM       AM PM       AM PM       AM PM         AM PM       AM PM       Total Daily amount of Acetaminophen Do not take more than  3,000 mg per day       Day 6    Time  Name of Medication Number of pills taken  Amount of Acetaminophen  Pain Level  Comments  AM PM       AM PM       AM PM       AM PM       AM PM       AM PM       AM PM       AM PM       Total Daily amount of Acetaminophen Do not take more than  3,000 mg per day      Day 7    Time  Name of Medication Number of pills taken  Amount of Acetaminophen  Pain Level   Comments  AM PM       AM PM       AM PM       AM PM       AM PM       AM PM       AM PM       AM PM       Total Daily amount of Acetaminophen Do not take more than  3,000 mg per day        For additional information about how and where to safely dispose of unused opioid medications - https://www.morepowerfulnc.org  Disclaimer: This document contains information and/or instructional materials adapted  from [facility] for the typical patient with your condition. It does not replace medical advice from your health care provider because your experience may differ from that of the typical patient. Talk to your health care provider if you have any questions about this document, your condition or your treatment plan. Adapted from [facility]   

## 2019-05-30 NOTE — Op Note (Signed)
Zaniya Mcaulay Schmieg [id number] August 14, 1975 05/30/2019  Laparoscopic Cholecystectomy Procedure Note  Indications: This patient presents with symptomatic gallbladder disease and will undergo laparoscopic cholecystectomy.  Pre-operative Diagnosis: symptomatic cholelithiasis  Post-operative Diagnosis: Same  Surgeon: Greer Pickerel MD FACS  Assistants: none  Anesthesia: General endotracheal anesthesia  Procedure Details  The patient was seen again in the Holding Room. The risks, benefits, complications, treatment options, and expected outcomes were discussed with the patient. The possibilities of reaction to medication, pulmonary aspiration, perforation of viscus, bleeding, recurrent infection, finding a normal gallbladder, the need for additional procedures, failure to diagnose a condition, the possible need to convert to an open procedure, and creating a complication requiring transfusion or operation were discussed with the patient. The likelihood of improving the patient's symptoms with return to their baseline status is good.  The patient and/or family concurred with the proposed plan, giving informed consent. The site of surgery properly noted. The patient was taken to Operating Room, identified as Kallie Locks and the procedure verified as Laparoscopic Cholecystectomy. A Time Out was held and the above information confirmed. Antibiotic prophylaxis was administered.   Prior to the induction of general anesthesia, antibiotic prophylaxis was administered. General endotracheal anesthesia was then administered and tolerated well. After the induction, the abdomen was prepped with Chloraprep and draped in the sterile fashion. The patient was positioned in the supine position.  Local anesthetic agent was injected into the skin near the umbilicus and an incision made. We dissected down to the abdominal fascia with blunt dissection.  The fascia was incised vertically and we entered the peritoneal cavity bluntly.   A pursestring suture of 0-Vicryl was placed around the fascial opening.  The Hasson cannula was inserted and secured with the stay suture.  Pneumoperitoneum was then created with CO2 and tolerated well without any adverse changes in the patient's vital signs. An 5-mm port was placed in the subxiphoid position.  Two 5-mm ports were placed in the right upper quadrant. All skin incisions were infiltrated with a local anesthetic agent before making the incision and placing the trocars.   We positioned the patient in reverse Trendelenburg, tilted slightly to the patient's left.  The gallbladder was identified, the fundus grasped and retracted cephalad. Adhesions were lysed bluntly and with the electrocautery where indicated, taking care not to injure any adjacent organs or viscus. The infundibulum was grasped and retracted laterally, exposing the peritoneum overlying the triangle of Calot. This was then divided and exposed in a blunt fashion. A critical view of the cystic duct and cystic artery (she had both a small anterior branch and a posterior branch) was obtained.  The cystic duct was clearly identified and bluntly dissected circumferentially.   The cystic duct was then ligated with clips and divided. Both branches of the cystic artery which had been identified & dissected free were ligated with clips and divided as well.   The gallbladder was dissected from the liver bed in retrograde fashion with the electrocautery. There was some spillage of bile from the gallbladder but no stone spillage.  The gallbladder was removed and placed in an Ecco sac.  The gallbladder and Ecco sac were then removed through the umbilical port site. The liver bed was irrigated and inspected. Hemostasis was achieved with the electrocautery. Copious irrigation was utilized and was repeatedly aspirated until clear. I did place a piece of ethicon surgical snow in the gallbladder fossa.  The pursestring suture was used to close the  umbilical fascia.  An  additional interrupted 0 vicryl suture was placed in the umbilical fascia.   We again inspected the right upper quadrant for hemostasis.  The umbilical closure was inspected and there was no air leak and nothing trapped within the closure. Pneumoperitoneum was released as we removed the trocars.  4-0 Monocryl was used to close the skin.   Dermabond was applied. The patient was then extubated and brought to the recovery room in stable condition. Instrument, sponge, and needle counts were correct at closure and at the conclusion of the case.   Findings: +critical view +snow  Estimated Blood Loss: Minimal         Drains: none         Specimens: Gallbladder           Complications: None; patient tolerated the procedure well.         Disposition: PACU - hemodynamically stable.         Condition: stable  Mary Sella. Andrey Campanile, MD, FACS General, Bariatric, & Minimally Invasive Surgery [facility] [place]

## 2019-05-30 NOTE — Anesthesia Postprocedure Evaluation (Signed)
Anesthesia Post Note  Patient: Tonya Andrews  Procedure(s) Performed: LAPAROSCOPIC CHOLECYSTECTOMY (N/A Abdomen)     Patient location during evaluation: PACU Anesthesia Type: General Level of consciousness: sedated Pain management: pain level controlled Vital Signs Assessment: post-procedure vital signs reviewed and stable Respiratory status: spontaneous breathing and respiratory function stable Cardiovascular status: stable Postop Assessment: no apparent nausea or vomiting Anesthetic complications: no    Last Vitals:  Vitals:   05/30/19 1300 05/30/19 1315  BP: 131/89 128/88  Pulse: 71 68  Resp: 11 15  Temp: (!) 36.3 C   SpO2: 100% 99%    Last Pain:  Vitals:   05/30/19 1315  TempSrc:   PainSc: Asleep                 Jayleana Colberg DANIEL

## 2019-05-30 NOTE — Transfer of Care (Signed)
Immediate Anesthesia Transfer of Care Note  Patient: Tonya Andrews  Procedure(s) Performed: LAPAROSCOPIC CHOLECYSTECTOMY (N/A Abdomen)  Patient Location: PACU  Anesthesia Type:General  Level of Consciousness: awake, alert  and oriented  Airway & Oxygen Therapy: Patient Spontanous Breathing and Patient connected to face mask oxygen  Post-op Assessment: Report given to RN and Post -op Vital signs reviewed and stable  Post vital signs: Reviewed and stable  Last Vitals:  Vitals Value Taken Time  BP 127/89 05/30/19 1213  Temp    Pulse 82 05/30/19 1215  Resp 14 05/30/19 1215  SpO2 100 % 05/30/19 1215  Vitals shown include unvalidated device data.  Last Pain:  Vitals:   05/30/19 0912  TempSrc:   PainSc: 4       Patients Stated Pain Goal: 4 (05/30/19 0912)  Complications: No apparent anesthesia complications

## 2019-05-31 LAB — SURGICAL PATHOLOGY

## 2019-08-24 ENCOUNTER — Ambulatory Visit: Payer: BC Managed Care – PPO | Admitting: Family Medicine

## 2020-04-09 ENCOUNTER — Telehealth: Payer: Self-pay | Admitting: Cardiovascular Disease

## 2020-04-09 NOTE — Telephone Encounter (Signed)
    Pt said she is having issue with her BP and wants to see Dr. Excell Seltzer again. Advised that Dr. Excell Seltzer is no longer accepting new patients, it's been more than 3 years from the last time she was seen. She requesting Dr. Excell Seltzer, she said he did her surgery and would like to see him again, request to send him a message

## 2020-04-09 NOTE — Telephone Encounter (Signed)
Left message to call back.  Will reestablish patient with a general cardiologist.

## 2020-04-12 NOTE — Telephone Encounter (Signed)
The patient would like to reestablish with Cardiology since she has not followed up in several years. Scheduled her with Dr. Shari Prows 04/29/2020.  Will request records from Dr. Katrinka Blazing at the [facility] in [place]. She was grateful for call and agrees with plan.

## 2020-04-18 DIAGNOSIS — I639 Cerebral infarction, unspecified: Secondary | ICD-10-CM | POA: Insufficient documentation

## 2020-04-26 NOTE — Progress Notes (Signed)
Cardiology Office Note:    Date:  04/29/2020   ID:  Tonya Andrews, DOB 1975-07-05, MRN [id number]  PCP:  Mila Palmer, MD   [facility]  Cardiologist:  No primary care provider on file.  Advanced Practice Provider:  No care team member to display Electrophysiologist:  None   Referring MD: Mila Palmer, MD   History of Present Illness:    Tonya Andrews is a 45 y.o. female with a hx of TIA and ASD s/p transcatheter closure in 09/2009 who was previously followed by Dr. Excell Seltzer who now re-presents to cardiology clinic to re-establish care.   Patients states that she was diagnosed with fibromyalgia, chronic fatigue and POTs by a specialist in [place] who recommended she start duloxetine and fludrocortisone. She is concerned about how these medications may impact her risk of stroke given her history of TIA and is also worried about not having an echo for a while after her ASD closure.   In terms of her POTs, she has lightheadedness, palpitations especially when changing positions. No syncope. Has been increasing her fluid, compression and salt intake, which has helped. 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 She has started taking duloxetine but has not yet started the fludrocortisone yet.  Past Medical History:  Diagnosis Date  . Gallstones   . Migraine headache    Dr Neale Burly  . Nasal polyp   . Nerve pain   . PFO (patent foramen ovale) 04/2009   by bubble study/TEE, Dr Excell Seltzer, cardiology  . Pneumonia 2008  . PONV (postoperative nausea and vomiting)   . Seasonal allergies   . Stroke [facility]) 04/2009, 07/2009   Dr Pearlean Brownie    Past Surgical History:  Procedure Laterality Date  . CHOLECYSTECTOMY N/A 05/30/2019   Procedure: LAPAROSCOPIC CHOLECYSTECTOMY;  Surgeon: Gaynelle Adu, MD;  Location: [facility];  Service: General;  Laterality: N/A;  . KNEE SURGERY Right    x2  . PATENT FORAMEN OVALE(PFO) CLOSURE  09/2009    transcath closure   . WISDOM TOOTH EXTRACTION      Current Medications: Current Meds  Medication Sig  . cetirizine (ZYRTEC) 10 MG tablet Take 10 mg by mouth every evening.   . DULoxetine (CYMBALTA) 30 MG capsule Take 30 mg by mouth daily.  . fluticasone (FLONASE SENSIMIST) 27.5 MCG/SPRAY nasal spray Place 1 spray into the nose daily.  Marland Kitchen topiramate (TOPAMAX) 200 MG tablet Take 200 mg by mouth every evening.      Allergies:   Latex   Social History   Socioeconomic History  . Marital status: Married    Spouse name: Not on file  . Number of children: 3  . Years of education: masters  . 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 Smoking status: Never Smoker  . Smokeless tobacco: Never Used  Vaping Use  . Vaping Use: Never used  Substance and Sexual Activity  . Alcohol use: Yes    Comment: 1 wine  . Drug use: No  . Sexual activity: Not on file    Comment: Vasectomy  Other Topics Concern  . Not on file  Social History Narrative   Lives home with family   Caffeine- coffee, 1 daily   Social Determinants of Health   Financial Resource Strain: Not on file  Food Insecurity: Not on file  Transportation Needs: Not on file  Physical  Activity: Not on file  Stress: Not on file  Social Connections: Not on file     Family History: The patient's family history includes Arthritis/Rheumatoid in her father; Hypertension in her mother.  ROS:   Please see the history of present illness.    Review of Systems  Constitutional: Positive for malaise/fatigue. Negative for chills and fever.  HENT: Negative for hearing loss and sore throat.   Eyes: Negative for blurred vision and redness.  Respiratory: Positive for shortness of breath.   Cardiovascular: Positive for palpitations. Negative for chest pain, orthopnea, claudication, leg swelling and PND.  Gastrointestinal: Negative for melena, nausea and vomiting.  Genitourinary:  Negative for dysuria and flank pain.  Musculoskeletal: Positive for myalgias. Negative for falls.  Neurological: Positive for dizziness. Negative for loss of consciousness.  Endo/Heme/Allergies: Negative for polydipsia.  Psychiatric/Behavioral: Negative for substance abuse. The patient is nervous/anxious.     EKGs/Labs/Other Studies Reviewed:    The following studies were reviewed today: TTE in 2013: Study Conclusions  - Left ventricle: The cavity size was normal. Wall thickness  was normal. Systolic function was normal. The estimated  ejection fraction was in the range of 55% to 60%.  - Left atrium: Amplatzer device well seated. No flow seen by  color doppler.    EKG:  EKG is  ordered today.  The ekg ordered today demonstrates sinus bradycardia, low voltage HR 58  Recent Labs: 05/25/2019: Hemoglobin 13.9; Platelets 291  Recent Lipid Panel    Component Value Date/Time   CHOL  08/12/2009 0439    138        ATP III CLASSIFICATION:  <200     mg/dL   Desirable  502-774  mg/dL   Borderline High  >=128    mg/dL   High          TRIG 38 08/12/2009 0439   HDL 50 08/12/2009 0439   CHOLHDL 2.8 08/12/2009 0439   VLDL 8 08/12/2009 0439   LDLCALC  08/12/2009 0439    80        Total Cholesterol/HDL:CHD Risk Coronary Heart Disease Risk Table                     Men   Women  1/2 Average Risk   3.4   3.3  Average Risk       5.0   4.4  2 X Average Risk   9.6   7.1  3 X Average Risk  23.4   11.0        Use the calculated Patient Ratio above and the CHD Risk Table to determine the patient's CHD Risk.        ATP III CLASSIFICATION (LDL):  <100     mg/dL   Optimal  786-767  mg/dL   Near or Above                    Optimal  130-159  mg/dL   Borderline  209-470  mg/dL   High  >962     mg/dL   Very High     Risk Assessment/Calculations:       Physical Exam:    VS:  BP 118/88   Pulse (!) 58   Ht 5\' 6"  (1.676 m)   Wt 155 lb 9.6 oz (70.6 kg)   SpO2 99%   BMI 25.11  kg/m     Wt Readings from Last 3 Encounters:  04/29/20 155 lb 9.6 oz (70.6 kg)  05/30/19 161 lb 6 oz (73.2 kg)  05/25/19 161 lb 6 oz (73.2 kg)     GEN:  Well nourished, well developed in no acute distress HEENT: Normal NECK: No JVD; No carotid bruits CARDIAC: RRR, no murmurs, rubs, gallops RESPIRATORY:  Clear to auscultation without rales, wheezing or rhonchi  ABDOMEN: Soft, non-tender, non-distended MUSCULOSKELETAL:  No edema; No deformity  SKIN: Warm and dry NEUROLOGIC:  Alert and oriented x 3 PSYCHIATRIC:  Normal affect   ASSESSMENT:    1. ASD (atrial septal defect)   2. TIA (transient ischemic attack)   3. POTS (postural orthostatic tachycardia syndrome)   4. Chronic fatigue   5. Fibromyalgia    PLAN:    In order of problems listed above:  #History of TIA #ASD s/p transcatheter closure in 2011: Patient with history of TIA found to have ASD s/p closure with amplatzer device. Previously followed by Dr. Excell Seltzer. Last TTE in 2013 without residual shunt.  -Check TTE -Resume ASA 81mg  daily  #POTs: #Chronic Fatigue: #Fibromyalgia: Patient recently diagnosed with POTs, chronic fatigue syndrome and fibromyalgia. Has lightheadedness and palpitations frequently especially with changes in position. Also with chronic fatigue and myalgias. Has been managed by a provider in [place]. Discussed the medications recommended are okay from a CV standpoint. -Management per provider in [facility] for duloxetine and fludrocortisone from CV perspective -Continue fluid and salt intake  -Continue compression therapy     Medication Adjustments/Labs and Tests Ordered: Current medicines are reviewed at length with the patient today.  Concerns regarding medicines are outlined above.  Orders Placed This Encounter  Procedures  . EKG 12-Lead  . ECHOCARDIOGRAM COMPLETE   No orders of the defined types were placed in this encounter.   Patient Instructions  Medication Instructions:    Your physician recommends that you continue on your current medications as directed. Please refer to the Current Medication list given to you today.  *If you need a refill on your cardiac medications before your next appointment, please call your pharmacy*   Testing/Procedures:  Your physician has requested that you have an echocardiogram. Echocardiography is a painless test that uses sound waves to create images of your heart. It provides your doctor with information about the size and shape of your heart and how well your heart's chambers and valves are working. This procedure takes approximately one hour. There are no restrictions for this procedure.   Follow-Up: At [facility], you and your health needs are our priority.  As part of our continuing mission to provide you with exceptional heart care, we have created designated Provider Care Teams.  These Care Teams include your primary Cardiologist (physician) and Advanced Practice Providers (APPs -  Physician Assistants and Nurse Practitioners) who all work together to provide you with the care you need, when you need it.  We recommend signing up for the patient portal called "MyChart".  Sign up information is provided on this After Visit Summary.  MyChart is used to connect with patients for Virtual Visits (Telemedicine).  Patients are able to view lab/test results, encounter notes, upcoming appointments, etc.  Non-urgent messages can be sent to your provider as well.   To learn more about what you can do with MyChart, go to [facility].    Your next appointment:   1 year(s)  The format for your next appointment:   In Person  Provider:   [email or web address], MD       Signed, Laurance Flatten, MD  04/29/2020 12:07 PM  [facility]

## 2020-04-29 ENCOUNTER — Other Ambulatory Visit: Payer: Self-pay

## 2020-04-29 ENCOUNTER — Encounter: Payer: Self-pay | Admitting: Cardiology

## 2020-04-29 ENCOUNTER — Ambulatory Visit (INDEPENDENT_AMBULATORY_CARE_PROVIDER_SITE_OTHER): Payer: No Typology Code available for payment source | Admitting: Cardiology

## 2020-04-29 VITALS — BP 118/88 | HR 58 | Ht 66.0 in | Wt 155.6 lb

## 2020-04-29 DIAGNOSIS — M797 Fibromyalgia: Secondary | ICD-10-CM

## 2020-04-29 DIAGNOSIS — Q211 Atrial septal defect, unspecified: Secondary | ICD-10-CM

## 2020-04-29 DIAGNOSIS — I498 Other specified cardiac arrhythmias: Secondary | ICD-10-CM | POA: Diagnosis not present

## 2020-04-29 DIAGNOSIS — G459 Transient cerebral ischemic attack, unspecified: Secondary | ICD-10-CM

## 2020-04-29 DIAGNOSIS — R5382 Chronic fatigue, unspecified: Secondary | ICD-10-CM

## 2020-04-29 DIAGNOSIS — G90A Postural orthostatic tachycardia syndrome (POTS): Secondary | ICD-10-CM

## 2020-04-29 NOTE — Patient Instructions (Signed)
Medication Instructions:   Your physician recommends that you continue on your current medications as directed. Please refer to the Current Medication list given to you today.  *If you need a refill on your cardiac medications before your next appointment, please call your pharmacy*   Testing/Procedures:  Your physician has requested that you have an echocardiogram. Echocardiography is a painless test that uses sound waves to create images of your heart. It provides your doctor with information about the size and shape of your heart and how well your heart's chambers and valves are working. This procedure takes approximately one hour. There are no restrictions for this procedure.   Follow-Up: At [facility], you and your health needs are our priority.  As part of our continuing mission to provide you with exceptional heart care, we have created designated Provider Care Teams.  These Care Teams include your primary Cardiologist (physician) and Advanced Practice Providers (APPs -  Physician Assistants and Nurse Practitioners) who all work together to provide you with the care you need, when you need it.  We recommend signing up for the patient portal called "MyChart".  Sign up information is provided on this After Visit Summary.  MyChart is used to connect with patients for Virtual Visits (Telemedicine).  Patients are able to view lab/test results, encounter notes, upcoming appointments, etc.  Non-urgent messages can be sent to your provider as well.   To learn more about what you can do with MyChart, go to [email or web address].    Your next appointment:   1 year(s)  The format for your next appointment:   In Person  Provider:   Laurance Flatten, MD

## 2020-05-31 ENCOUNTER — Ambulatory Visit (HOSPITAL_COMMUNITY): Payer: No Typology Code available for payment source | Attending: Cardiovascular Disease

## 2020-05-31 ENCOUNTER — Other Ambulatory Visit: Payer: Self-pay

## 2020-05-31 DIAGNOSIS — Q211 Atrial septal defect, unspecified: Secondary | ICD-10-CM

## 2020-05-31 LAB — ECHOCARDIOGRAM COMPLETE
Area-P 1/2: 3.42 cm2
S' Lateral: 3.7 cm

## 2020-12-20 ENCOUNTER — Other Ambulatory Visit: Payer: Self-pay

## 2020-12-20 ENCOUNTER — Other Ambulatory Visit (HOSPITAL_BASED_OUTPATIENT_CLINIC_OR_DEPARTMENT_OTHER): Payer: Self-pay

## 2020-12-20 ENCOUNTER — Emergency Department (HOSPITAL_BASED_OUTPATIENT_CLINIC_OR_DEPARTMENT_OTHER)
Admission: EM | Admit: 2020-12-20 | Discharge: 2020-12-20 | Disposition: A | Discharge status: Home / Self Care | Payer: No Typology Code available for payment source | Attending: Emergency Medicine | Admitting: Emergency Medicine

## 2020-12-20 ENCOUNTER — Encounter (HOSPITAL_BASED_OUTPATIENT_CLINIC_OR_DEPARTMENT_OTHER): Payer: Self-pay | Admitting: *Deleted

## 2020-12-20 ENCOUNTER — Emergency Department (HOSPITAL_BASED_OUTPATIENT_CLINIC_OR_DEPARTMENT_OTHER): Payer: No Typology Code available for payment source | Admitting: Radiology

## 2020-12-20 DIAGNOSIS — J3489 Other specified disorders of nose and nasal sinuses: Secondary | ICD-10-CM | POA: Diagnosis not present

## 2020-12-20 DIAGNOSIS — Z91040 Latex allergy status: Secondary | ICD-10-CM | POA: Insufficient documentation

## 2020-12-20 DIAGNOSIS — J069 Acute upper respiratory infection, unspecified: Secondary | ICD-10-CM

## 2020-12-20 DIAGNOSIS — R059 Cough, unspecified: Secondary | ICD-10-CM | POA: Diagnosis present

## 2020-12-20 DIAGNOSIS — M791 Myalgia, unspecified site: Secondary | ICD-10-CM | POA: Diagnosis not present

## 2020-12-20 DIAGNOSIS — R0982 Postnasal drip: Secondary | ICD-10-CM

## 2020-12-20 DIAGNOSIS — Z20822 Contact with and (suspected) exposure to covid-19: Secondary | ICD-10-CM | POA: Diagnosis not present

## 2020-12-20 HISTORY — DX: Fibromyalgia: M79.7

## 2020-12-20 LAB — RESP PANEL BY RT-PCR (FLU A&B, COVID) ARPGX2
Influenza A by PCR: NEGATIVE
Influenza B by PCR: NEGATIVE
SARS Coronavirus 2 by RT PCR: NEGATIVE

## 2020-12-20 MED ORDER — IPRATROPIUM-ALBUTEROL 0.5-2.5 (3) MG/3ML IN SOLN
3.0000 mL | Freq: Once | RESPIRATORY_TRACT | Status: AC
  Administered 2020-12-20: 3 mL via RESPIRATORY_TRACT
  Filled 2020-12-20: qty 3

## 2020-12-20 MED ORDER — SALINE SPRAY 0.65 % NA SOLN
1.0000 | NASAL | 0 refills | Status: DC | PRN
Start: 2020-12-20 — End: 2022-02-25
  Filled 2020-12-20: qty 44, 15d supply, fill #0

## 2020-12-20 MED ORDER — PSEUDOEPHEDRINE HCL ER 120 MG PO TB12
120.0000 mg | ORAL_TABLET | Freq: Two times a day (BID) | ORAL | 0 refills | Status: AC
  Filled 2020-12-20: qty 10, 5d supply, fill #0

## 2020-12-20 MED ORDER — GUAIFENESIN ER 600 MG PO TB12
600.0000 mg | ORAL_TABLET | Freq: Two times a day (BID) | ORAL | 0 refills | Status: AC
Start: 2020-12-20 — End: 2021-01-09
  Filled 2020-12-20: qty 40, 20d supply, fill #0

## 2020-12-20 MED ORDER — ALBUTEROL SULFATE HFA 108 (90 BASE) MCG/ACT IN AERS
1.0000 | INHALATION_SPRAY | Freq: Four times a day (QID) | RESPIRATORY_TRACT | 0 refills | Status: AC | PRN
  Filled 2020-12-20: qty 8.5, 25d supply, fill #0

## 2020-12-20 MED ORDER — FLUTICASONE PROPIONATE 50 MCG/ACT NA SUSP
2.0000 | Freq: Every day | NASAL | 2 refills | Status: AC
  Filled 2020-12-20: qty 16, 60d supply, fill #0

## 2020-12-20 NOTE — ED Provider Notes (Signed)
[facility] EMERGENCY DEPT Provider Note   CSN: [id number] Arrival date & time: 12/20/20  1034     History Chief Complaint  Patient presents with   Cough    Tonya Andrews is a 45 y.o. female.  This is a 45 y.o. female with significant medical history as below, including fibromyalgia, PFO who presents to the ED with complaint of cough, uri symptoms  Location:  n/a Duration:  2.5-3 wks Onset:  gradual Timing:  constant Description:  post nasal drip, clear rhinorrhea, cough, congestion, body aches  Severity:  mild Exacerbating/Alleviating Factors:  some improvement with child's neb treatment Associated Symptoms:  see above Pertinent Negatives:  no fevers or chills, no IVDU  Context: pt with multiple weeks of uri s/s, post nasal drip, clear mucus, symptoms constant, no period of improvement    The history is provided by the patient. No language interpreter was used.  Cough Associated symptoms: rhinorrhea   Associated symptoms: no chest pain, no eye discharge, no fever, no headaches, no rash and no shortness of breath       Past Medical History:  Diagnosis Date   Fibromyalgia    Gallstones    Migraine headache    Dr Neale Burly   Nasal polyp    Nerve pain    PFO (patent foramen ovale) 04/2009   by bubble study/TEE, Dr Excell Seltzer, cardiology   Pneumonia 2008   PONV (postoperative nausea and vomiting)    Seasonal allergies    Stroke [facility]) 04/2009, 07/2009   Dr Pearlean Brownie    Patient Active Problem List   Diagnosis Date Noted   PFO (patent foramen ovale) 05/03/2009   LOW HDL 05/02/2009   History of cardioembolic cerebrovascular accident (CVA) 05/02/2009   Migraine 05/02/2009    Past Surgical History:  Procedure Laterality Date   CHOLECYSTECTOMY N/A 05/30/2019   Procedure: LAPAROSCOPIC CHOLECYSTECTOMY;  Surgeon: Gaynelle Adu, MD;  Location: [facility];  Service: General;  Laterality: N/A;   KNEE SURGERY Right    x2   PATENT FORAMEN OVALE(PFO) CLOSURE  09/2009    transcath closure    WISDOM TOOTH EXTRACTION       OB History   No obstetric history on file.     Family History  Problem Relation Age of Onset   Hypertension Mother    Arthritis/Rheumatoid Father     Social History   Tobacco Use   Smoking status: Never   Smokeless tobacco: Never  Vaping Use   Vaping Use: Never used  Substance Use Topics   Alcohol use: Yes    Comment: 1 wine   Drug use: No    Home Medications Prior to Admission medications   Medication Sig Start Date End Date Taking? Authorizing Provider  albuterol (VENTOLIN HFA) 108 (90 Base) MCG/ACT inhaler Inhale 1-2 puffs into the lungs every 6 (six) hours as needed for wheezing or shortness of breath. 12/20/20  Yes Tanda Rockers A, DO  fluticasone (FLONASE) 50 MCG/ACT nasal spray Place 2 sprays into both nostrils daily for 7 days. 12/20/20 02/18/21 Yes Sloan Leiter, DO  guaiFENesin (MUCINEX) 600 MG 12 hr tablet Take 1 tablet (600 mg total) by mouth 2 (two) times daily for 5 days. 12/20/20 01/09/21 Yes Sloan Leiter, DO  pseudoephedrine (SUDAFED 12 HOUR) 120 MG 12 hr tablet Take 1 tablet (120 mg total) by mouth 2 (two) times daily for 5 days. 12/20/20 12/25/20 Yes Tanda Rockers A, DO  sodium chloride (OCEAN) 0.65 % SOLN nasal spray Place 1 spray  into both nostrils as needed for congestion. 12/20/20  Yes Tanda Rockers A, DO  cetirizine (ZYRTEC) 10 MG tablet Take 10 mg by mouth every evening.     [provider]  DULoxetine (CYMBALTA) 30 MG capsule Take 30 mg by mouth daily. 04/08/20   [provider]  fludrocortisone (FLORINEF) 0.1 MG tablet Take 100 mcg by mouth daily. Patient not taking: Reported on 04/29/2020 04/09/20   [provider]  fluticasone (FLONASE SENSIMIST) 27.5 MCG/SPRAY nasal spray Place 1 spray into the nose daily.    [provider]  topiramate (TOPAMAX) 200 MG tablet Take 200 mg by mouth every evening.     [provider]    Allergies    Latex  Review of  Systems   Review of Systems  Constitutional:  Negative for activity change and fever.  HENT:  Positive for congestion, postnasal drip and rhinorrhea. Negative for facial swelling and trouble swallowing.   Eyes:  Negative for discharge and redness.  Respiratory:  Positive for cough. Negative for shortness of breath.   Cardiovascular:  Negative for chest pain and palpitations.  Gastrointestinal:  Negative for abdominal pain and nausea.  Genitourinary:  Negative for dysuria and flank pain.  Musculoskeletal:  Negative for back pain and gait problem.  Skin:  Negative for pallor and rash.  Neurological:  Negative for syncope and headaches.   Physical Exam Updated Vital Signs BP 103/75   Pulse 76   Temp 97.9 F (36.6 C) (Oral)   Resp 20   Ht 5\' 5"  (1.651 m)   Wt 74.8 kg   LMP 12/13/2020   SpO2 100%   BMI 27.46 kg/m   Physical Exam Vitals and nursing note reviewed.  Constitutional:      General: She is not in acute distress.    Appearance: Normal appearance.  HENT:     Head: Normocephalic and atraumatic.     Comments: Cobblestoning to throat, no evidence of deep space infection    Right Ear: External ear normal.     Left Ear: External ear normal.     Nose: Nose normal.     Mouth/Throat:     Mouth: Mucous membranes are moist.     Pharynx: Oropharynx is clear. Uvula midline. No oropharyngeal exudate or uvula swelling.  Eyes:     General: No scleral icterus.       Right eye: No discharge.        Left eye: No discharge.  Cardiovascular:     Rate and Rhythm: Normal rate and regular rhythm.     Pulses: Normal pulses.     Heart sounds: Normal heart sounds.  Pulmonary:     Effort: Pulmonary effort is normal. No respiratory distress.     Breath sounds: Normal breath sounds.  Abdominal:     General: Abdomen is flat.     Tenderness: There is no abdominal tenderness.  Musculoskeletal:        General: Normal range of motion.     Cervical back: Normal range of motion.     Right  lower leg: No edema.     Left lower leg: No edema.  Skin:    General: Skin is warm and dry.     Capillary Refill: Capillary refill takes less than 2 seconds.  Neurological:     Mental Status: She is alert.  Psychiatric:        Mood and Affect: Mood normal.        Behavior: Behavior normal.  ED Results / Procedures / Treatments   Labs (all labs ordered are listed, but only abnormal results are displayed) Labs Reviewed  RESP PANEL BY RT-PCR (FLU A&B, COVID) [id number]    EKG None  Radiology DG Chest 2 View  Result Date: 12/20/2020 CLINICAL DATA:  Cough EXAM: CHEST - 2 VIEW COMPARISON:  09/05/2009 FINDINGS: Cardiac size is within normal limits. There is intracardiac device, possibly used for repair of atrial septal defect. There are no signs of pulmonary edema or focal pulmonary consolidation. There is no pleural effusion or pneumothorax. IMPRESSION: No active cardiopulmonary disease. Electronically Signed   By: Ernie Avena M.D.   On: 12/20/2020 12:11    Procedures Procedures   Medications Ordered in ED Medications  ipratropium-albuterol (DUONEB) 0.5-2.5 (3) MG/3ML nebulizer solution 3 mL (3 mLs Nebulization Given 12/20/20 1340)    ED Course  I have reviewed the triage vital signs and the nursing notes.  Pertinent labs & imaging results that were available during my care of the patient were reviewed by me and considered in my medical decision making (see chart for details).    MDM Rules/Calculators/A&P                           CC: cough/ uri s/s  This patient complains of above; this involves an extensive number of treatment options and is a complaint that carries with it a high risk of complications and morbidity. Vital signs were reviewed. Serious etiologies considered.  Record review:  Previous records obtained and reviewed    Work up as above, notable for:  Labs & imaging results that were available during my care of the patient were reviewed by me and  considered in my medical decision making.   I ordered imaging studies which included CXR and I independently visualized and interpreted imaging which showed no acute process  Management: Give neb breathing tx  Reassessment:  Pt with some mild improvement following neb, she has no respiratory distress, no evidence of deep space infection, no stridor, no drooling.   Favor cough likely 2/2 post nasal drip. She has seasonal allergies and is on zyrtec and flonase as needed. Recommend she continue these, add saline rinse, humidifier, decongestant. Tylenol/ motrin PRN.  No evidence of pneumonia, bacterial sinusitis, covid / flu neg  The patient improved significantly and was discharged in stable condition. Detailed discussions were had with the patient regarding current findings, and need for close f/u with PCP or on call doctor. The patient has been instructed to return immediately if the symptoms worsen in any way for re-evaluation. Patient verbalized understanding and is in agreement with current care plan. All questions answered prior to discharge.           This chart was dictated using voice recognition software.  Despite best efforts to proofread,  errors can occur which can change the documentation meaning.  Final Clinical Impression(s) / ED Diagnoses Final diagnoses:  Cough, unspecified type  PND (post-nasal drip)  Upper respiratory tract infection, unspecified type    Rx / DC Orders ED Discharge Orders          Ordered    guaiFENesin (MUCINEX) 600 MG 12 hr tablet  2 times daily        12/20/20 1449    fluticasone (FLONASE) 50 MCG/ACT nasal spray  Daily        12/20/20 1449    sodium chloride (OCEAN) 0.65 % SOLN nasal spray  As  needed        12/20/20 1449    pseudoephedrine (SUDAFED 12 HOUR) 120 MG 12 hr tablet  2 times daily        12/20/20 1449    albuterol (VENTOLIN HFA) 108 (90 Base) MCG/ACT inhaler  Every 6 hours PRN        12/20/20 1454             Sloan Leiter, DO 12/20/20 2014

## 2020-12-20 NOTE — ED Notes (Signed)
Pt d/c home per MD order. Discharge summary reviewed with pt, pt verbalizes understanding. Ambulatory off unit. No s/s of acute distress noted at discharge.,  °

## 2020-12-20 NOTE — ED Triage Notes (Addendum)
Pt reports she has been "sick for 3 weeks". Tested negative 10 days ago for covid/flu. Today states when she woke up she "couldn't breathe" and used her child's nebulizer with relief. She has had 2 steroid shots and taken a partial course of augmentin without relief

## 2021-04-25 IMAGING — CT CT ABD-PELV W/ CM
2 of 5 series · 16 of 46 positions shown, 18 images · IV contrast (APPLIED)
Comparison: CTA chest 04/24/2009.

CLINICAL DATA: 43-year-old female with right lower quadrant
abdominal pain beginning this afternoon.

EXAM:
CT ABDOMEN AND PELVIS WITH CONTRAST
TECHNIQUE: Multidetector CT imaging of the abdomen and pelvis was performed
using the standard protocol following bolus administration of
intravenous contrast.
CONTRAST:  100mL OMNIPAQUE IOHEXOL 300 MG/ML  SOLN

[Series 3: abdomen 5.0 · axial · 0.98mm/px · z∈[-552,-117]mm · 13 of 101 slices shown, 15 images]
[im 7/101  soft-tissue]
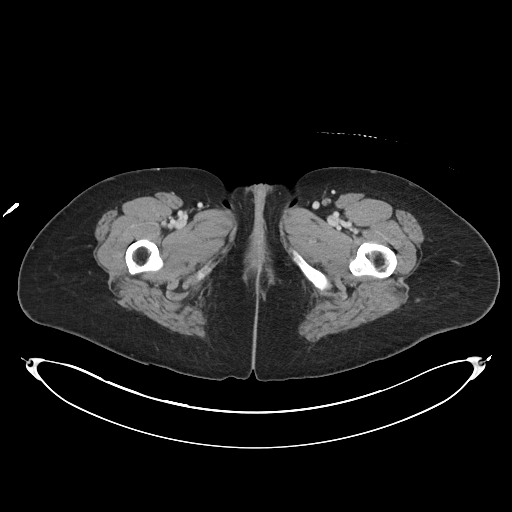
[im 7/101  bone]
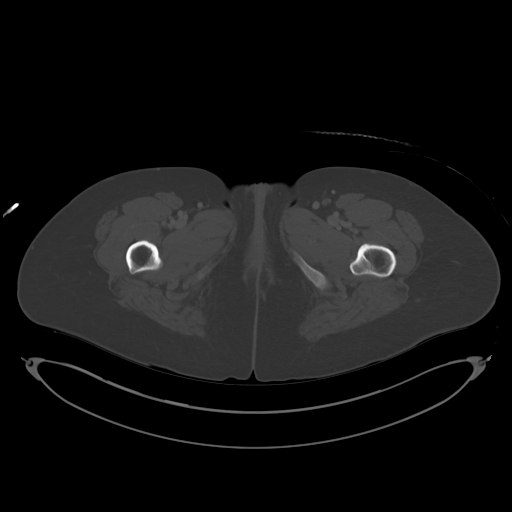
[im 14/101  soft-tissue]
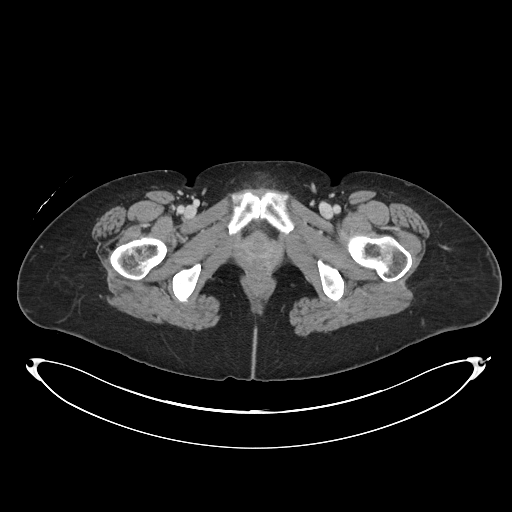
[im 21/101  soft-tissue]
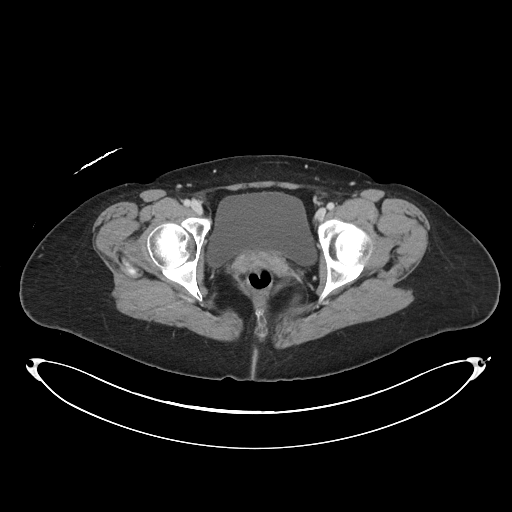
[im 27/101  soft-tissue]
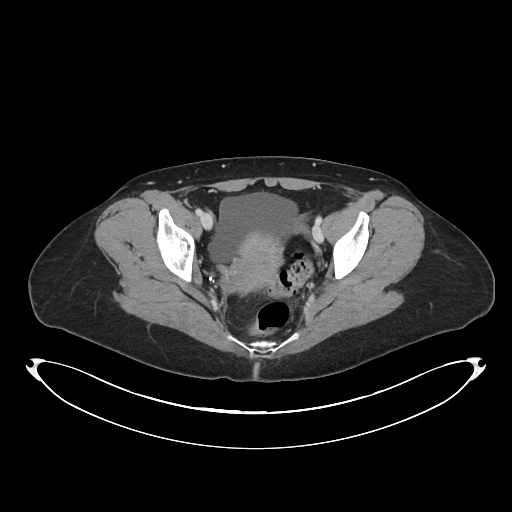
[im 34/101  soft-tissue]
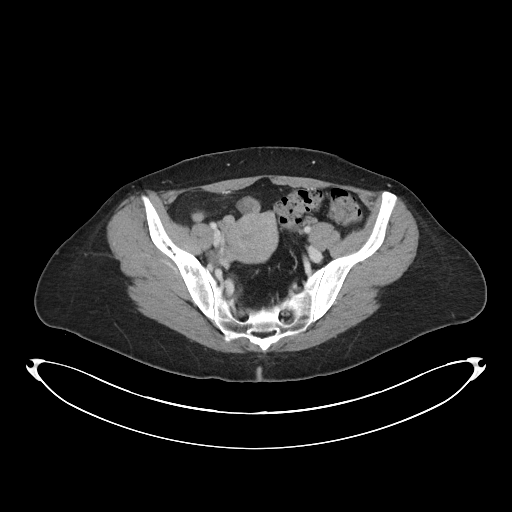
[im 41/101  soft-tissue]
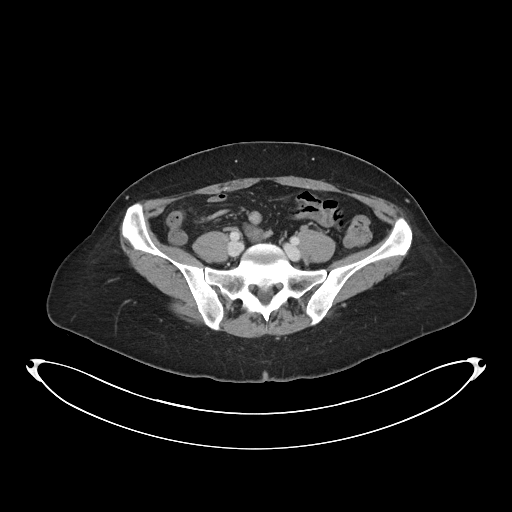
[im 54/101  soft-tissue]
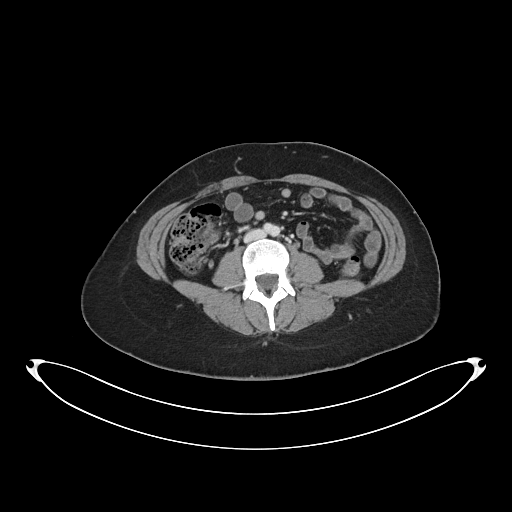
[im 61/101  soft-tissue]
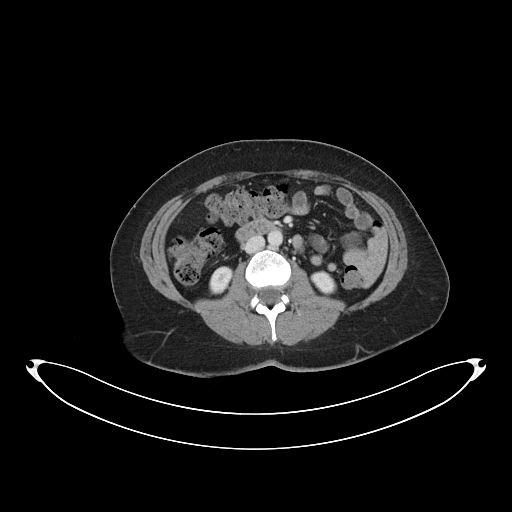
[im 67/101  soft-tissue]
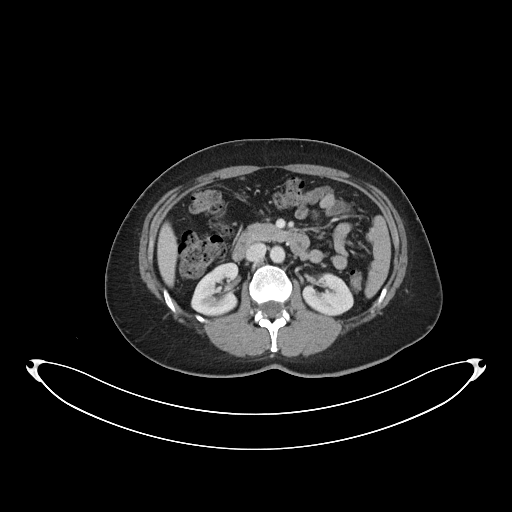
[im 67/101  bone]
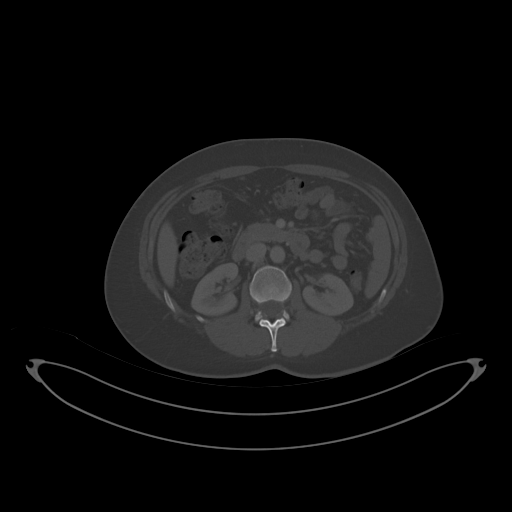
[im 74/101  soft-tissue]
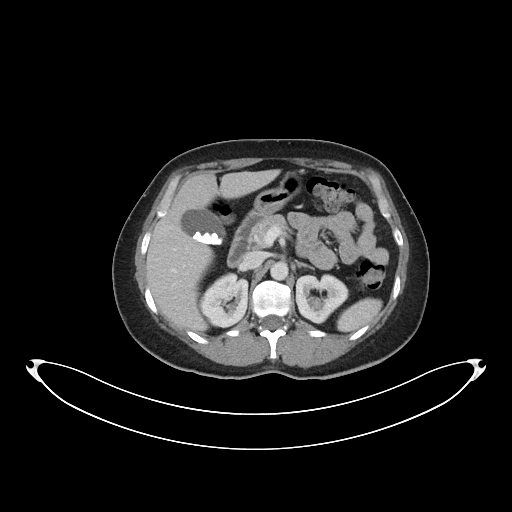
[im 81/101  soft-tissue]
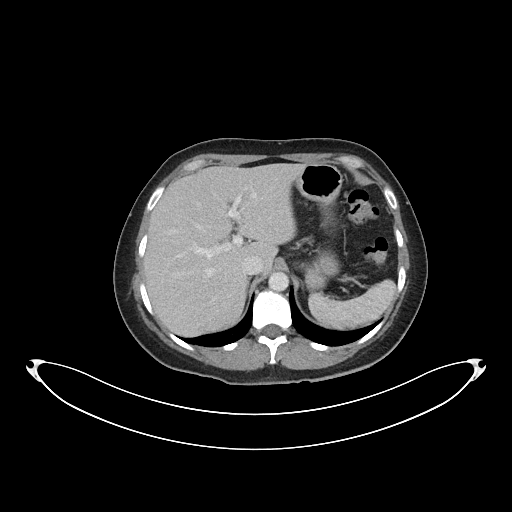
[im 87/101  soft-tissue]
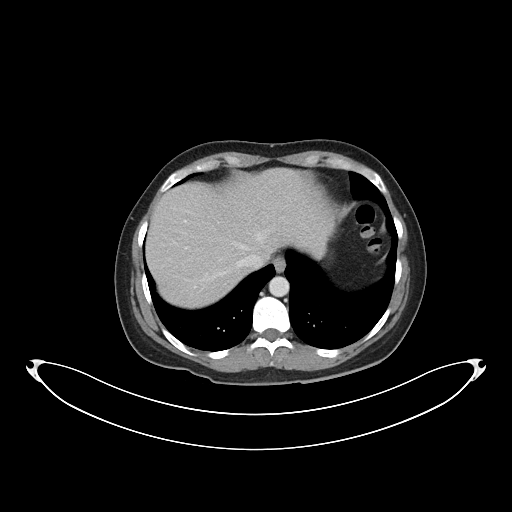
[im 94/101  soft-tissue]
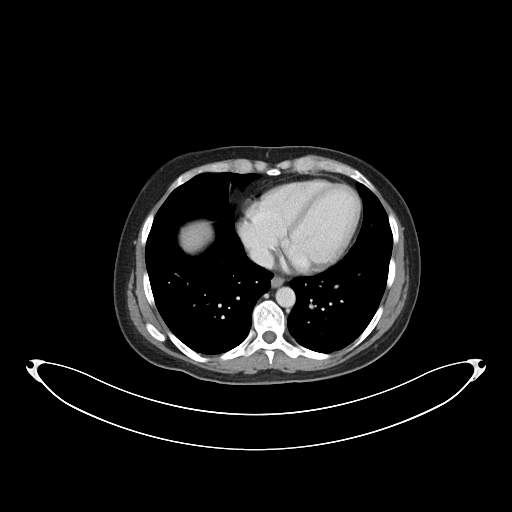

[Series 6: abdomen 3.0 mpr cor · coronal · 0.93mm/px · 3 of 97 slices shown]
[im 33/97  soft-tissue]
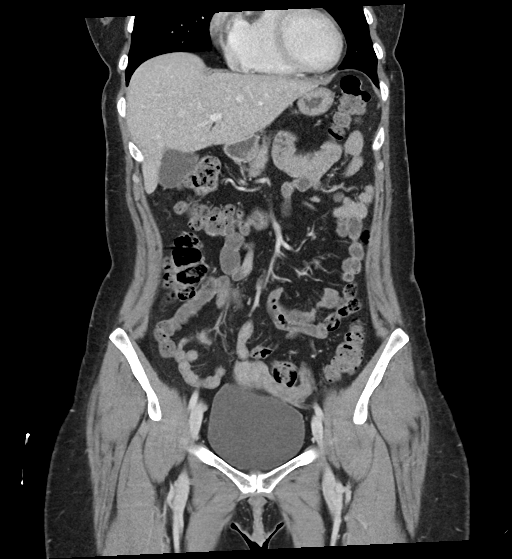
[im 43/97  soft-tissue]
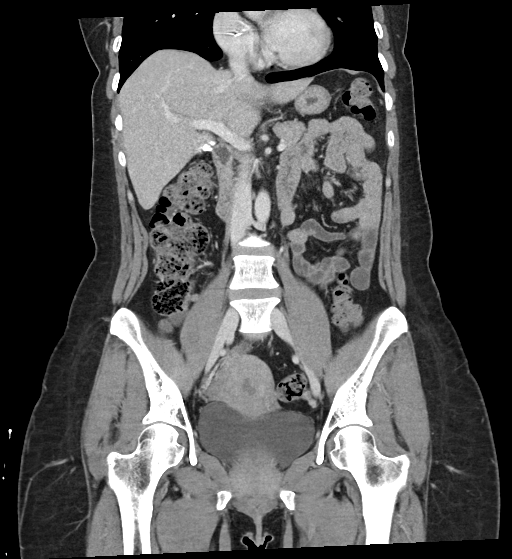
[im 54/97  soft-tissue]
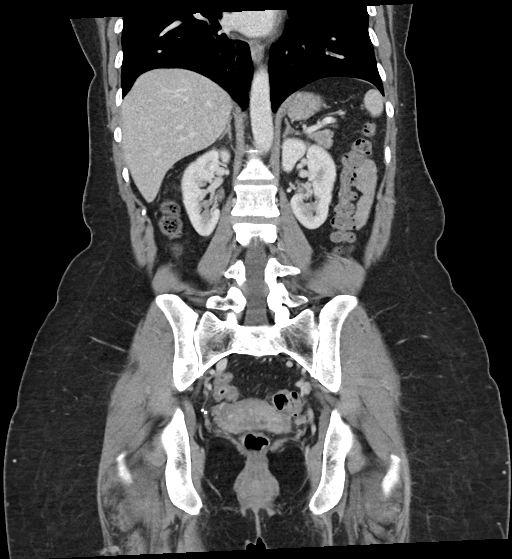

[16 of 46 positions shown; findings below may reference images not displayed]

FINDINGS: Lower chest: Cardiac interatrial occluded device in place. Otherwise
negative lower chest.

Hepatobiliary: Numerous gallstones individually up to 12 mm. But no
pericholecystic inflammation. No bile duct enlargement. Negative
liver.

Pancreas: Negative.

Spleen: Negative.

Adrenals/Urinary Tract: Normal adrenal glands.

Mostly punctate bilateral nephrolithiasis. There is a 3 mm left
lower pole calculus. No hydronephrosis or perinephric stranding.
Bilateral renal enhancement and contrast excretion is symmetric and
normal. Decompressed proximal ureters. Negative urinary bladder.
Incidental bilateral pelvic phleboliths.

Stomach/Bowel: Negative large bowel aside from some redundancy and
retained stool. Normal appendix seen on series 3, image 50 and
coronal image 47. Negative terminal ileum.

No dilated small bowel. Decompressed stomach. No free air, free
fluid.

Vascular/Lymphatic: Major arterial structures are patent with no
atherosclerosis identified. Portal venous system is patent. No
lymphadenopathy.

Reproductive: Negative.

Other: No pelvic free fluid.

Musculoskeletal: No acute osseous abnormality identified. Benign
appearing sclerosis in the right iliac wing.
IMPRESSION: 1. Numerous gallstones, but no CT evidence of acute cholecystitis.
No biliary ductal enlargement.
2. Nephrolithiasis without obstructive uropathy.
3. Normal appendix. No acute or inflammatory process identified in
the abdomen or pelvis.

## 2021-07-07 ENCOUNTER — Encounter: Payer: Self-pay | Admitting: Pulmonary Disease

## 2021-07-07 ENCOUNTER — Ambulatory Visit (INDEPENDENT_AMBULATORY_CARE_PROVIDER_SITE_OTHER): Payer: No Typology Code available for payment source | Admitting: Pulmonary Disease

## 2021-07-07 VITALS — BP 110/68 | HR 89 | Temp 97.6°F | Ht 66.0 in | Wt 176.6 lb

## 2021-07-07 DIAGNOSIS — G4719 Other hypersomnia: Secondary | ICD-10-CM | POA: Diagnosis not present

## 2021-07-07 NOTE — Patient Instructions (Signed)
We will schedule you for an in lab polysomnogram  We can consider a breathing study if you still have significant chest discomfort/shortness of breath/wheezing  Use your albuterol as needed, can be used up to 4 times a day as needed  Graded exercises as tolerated  Weight loss efforts as able  We will update you with results of your sleep study once reviewed  Living With Sleep Apnea Sleep apnea is a condition in which breathing pauses or becomes shallow during sleep. Sleep apnea is most commonly caused by a collapsed or blocked airway. People with sleep apnea usually snore loudly. They may have times when they gasp and stop breathing for 10 seconds or more during sleep. This may happen many times during the night. The breaks in breathing also interrupt the deep sleep that you need to feel rested. Even if you do not completely wake up from the gaps in breathing, your sleep may not be restful and you feel tired during the day. You may also have a headache in the morning and low energy during the day, and you may feel anxious or depressed. How can sleep apnea affect me? Sleep apnea increases your chances of extreme tiredness during the day (daytime fatigue). It can also increase your risk for health conditions, such as: Heart attack. Stroke. Obesity. Type 2 diabetes. Heart failure. Irregular heartbeat. High blood pressure. If you have daytime fatigue as a result of sleep apnea, you may be more likely to: Perform poorly at school or work. Fall asleep while driving. Have difficulty with attention. Develop depression or anxiety. Have sexual dysfunction. What actions can I take to manage sleep apnea? Sleep apnea treatment  If you were given a device to open your airway while you sleep, use it only as told by your health care provider. You may be given: An oral appliance. This is a custom-made mouthpiece that shifts your lower jaw forward. A continuous positive airway pressure (CPAP)  device. This device blows air through a mask when you breathe out (exhale). A nasal expiratory positive airway pressure (EPAP) device. This device has valves that you put into each nostril. A bi-level positive airway pressure (BIPAP) device. This device blows air through a mask when you breathe in (inhale) and breathe out (exhale). You may need surgery if other treatments do not work for you. Sleep habits Go to sleep and wake up at the same time every day. This helps set your internal clock (circadian rhythm) for sleeping. If you stay up later than usual, such as on weekends, try to get up in the morning within 2 hours of your normal wake time. Try to get at least 7-9 hours of sleep each night. Stop using a computer, tablet, and mobile phone a few hours before bedtime. Do not take long naps during the day. If you nap, limit it to 30 minutes. Have a relaxing bedtime routine. Reading or listening to music may relax you and help you sleep. Use your bedroom only for sleep. Keep your television and computer out of your bedroom. Keep your bedroom cool, dark, and quiet. Use a supportive mattress and pillows. Follow your health care provider's instructions for other changes to sleep habits. Nutrition Do not eat heavy meals in the evening. Do not have caffeine in the later part of the day. The effects of caffeine can last for more than 5 hours. Follow your health care provider's or dietitian's instructions for any diet changes. Lifestyle     Do not drink alcohol  before bedtime. Alcohol can cause you to fall asleep at first, but then it can cause you to wake up in the middle of the night and have trouble getting back to sleep. Do not use any products that contain nicotine or tobacco. These products include cigarettes, chewing tobacco, and vaping devices, such as e-cigarettes. If you need help quitting, ask your health care provider. Medicines Take over-the-counter and prescription medicines only as  told by your health care provider. Do not use over-the-counter sleep medicine. You can become dependent on this medicine, and it can make sleep apnea worse. Do not use medicines, such as sedatives and narcotics, unless told by your health care provider. Activity Exercise on most days, but avoid exercising in the evening. Exercising near bedtime can interfere with sleeping. If possible, spend time outside every day. Natural light helps regulate your circadian rhythm. General information Lose weight if you need to, and maintain a healthy weight. Keep all follow-up visits. This is important. If you are having surgery, make sure to tell your health care provider that you have sleep apnea. You may need to bring your device with you. Where to find more information Learn more about sleep apnea and daytime fatigue from: American Sleep Association: sleepassociation.[place]: sleepfoundation.org National Heart, Lung, and Blood Institute: https://www.[place].biz/ Summary Sleep apnea is a condition in which breathing pauses or becomes shallow during sleep. Sleep apnea can cause daytime fatigue and other serious health conditions. You may need to wear a device while sleeping to help keep your airway open. If you are having surgery, make sure to tell your health care provider that you have sleep apnea. You may need to bring your device with you. Making changes to sleep habits, diet, lifestyle, and activity can help you manage sleep apnea. This information is not intended to replace advice given to you by your health care provider. Make sure you discuss any questions you have with your health care provider. Document Revised: 08/28/2020 Document Reviewed: 12/29/2019 Elsevier Patient Education  [place].

## 2021-07-07 NOTE — Progress Notes (Signed)
Tonya Andrews    [id number]    03-09-1975  Primary Care Physician:Wolters, Jasmine December, MD  Referring Physician: Santiago Glad, MD [address] [place],  [place] [address]  Chief complaint:   Patient is being seen for less concern for sleep disordered breathing Excessive daytime sleepiness  HPI:  History of snoring, no witnessed apneas  Nonrestorative sleep Admits to daytime fatigue and sleepiness She does have a background history of fibromyalgia  Still feels tired and sleepy despite spending enough hours in bed Wakes up still feeling tired  Weight gain, no history of thyroid dysfunction  History of asthma for which she uses albuterol as needed Bouts of bronchitis in the past  Never smoker, no pertinent occupational history  Outpatient Encounter Medications as of 07/07/2021  Medication Sig   albuterol (VENTOLIN HFA) 108 (90 Base) MCG/ACT inhaler Inhale 1-2 puffs into the lungs every 6 (six) hours as needed for wheezing or shortness of breath.   cetirizine (ZYRTEC) 10 MG tablet Take 10 mg by mouth every evening.    DULoxetine (CYMBALTA) 30 MG capsule Take 30 mg by mouth daily.   fluticasone (FLONASE) 50 MCG/ACT nasal spray Place 2 sprays into both nostrils daily for 7 days.   Multiple Vitamins-Minerals (MULTIVITAMIN WITH MINERALS) tablet Take 1 tablet by mouth daily.   sodium chloride (OCEAN) 0.65 % SOLN nasal spray Place 1 spray into both nostrils as needed for congestion.   topiramate (TOPAMAX) 200 MG tablet Take 200 mg by mouth every evening.    [DISCONTINUED] amoxicillin (AMOXIL) 875 MG tablet Take by mouth 2 (two) times daily.   [DISCONTINUED] cephALEXin (KEFLEX) 500 MG capsule Take 500 mg by mouth 2 (two) times daily.   [DISCONTINUED] fluconazole (DIFLUCAN) 150 MG tablet Take 150 mg by mouth as directed. (Patient not taking: Reported on 07/07/2021)   [DISCONTINUED] fludrocortisone (FLORINEF) 0.1 MG tablet Take 100 mcg by mouth daily. (Patient not taking:  Reported on 04/29/2020)   [DISCONTINUED] fluticasone (FLONASE SENSIMIST) 27.5 MCG/SPRAY nasal spray Place 1 spray into the nose daily. (Patient not taking: Reported on 07/07/2021)   No facility-administered encounter medications on file as of 07/07/2021.    Allergies as of 07/07/2021 - Review Complete 07/07/2021  Allergen Reaction Noted   Amoxicillin Nausea And Vomiting 07/07/2021   Latex Other (See Comments) 10/17/2010    Past Medical History:  Diagnosis Date   Fibromyalgia    Gallstones    Migraine headache    Dr Neale Burly   Nasal polyp    Nerve pain    PFO (patent foramen ovale) 04/2009   by bubble study/TEE, Dr Excell Seltzer, cardiology   Pneumonia 2008   PONV (postoperative nausea and vomiting)    Seasonal allergies    Stroke (HCC) 04/2009, 07/2009   Dr Pearlean Brownie    Past Surgical History:  Procedure Laterality Date   CHOLECYSTECTOMY N/A 05/30/2019   Procedure: LAPAROSCOPIC CHOLECYSTECTOMY;  Surgeon: Gaynelle Adu, MD;  Location: [facility];  Service: General;  Laterality: N/A;   KNEE SURGERY Right    x2   PATENT FORAMEN OVALE(PFO) CLOSURE  09/2009   transcath closure    WISDOM TOOTH EXTRACTION      Family History  Problem Relation Age of Onset   Hypertension Mother    Arthritis/Rheumatoid Father     Social History   Socioeconomic History   Marital status: Married    Spouse name: Not on file   Number of children: 3   Years of education: masters  Highest education level: Not on file  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  Tobacco Use   Smoking status: Never   Smokeless tobacco: Never  Vaping Use   Vaping Use: Never used  Substance and Sexual Activity   Alcohol use: Yes    Comment: 1 wine   Drug use: No   Sexual activity: Not on file    Comment: Vasectomy  Other Topics Concern   Not on file  Social History Narrative   Lives home with family   Caffeine- coffee, 1 daily   Social Determinants of Health   Financial Resource Strain: Not on file  Food Insecurity:  Not on file  Transportation Needs: Not on file  Physical Activity: Not on file  Stress: Not on file  Social Connections: Not on file  Intimate Partner Violence: Not on file    Review of Systems  Constitutional:  Positive for fatigue.  Psychiatric/Behavioral:  Positive for sleep disturbance.     Vitals:   07/07/21 0935  BP: 110/68  Pulse: 89  Temp: 97.6 F (36.4 C)  SpO2: 98%     Physical Exam Constitutional:      Appearance: Normal appearance.  HENT:     Head: Normocephalic.     Mouth/Throat:     Mouth: Mucous membranes are moist.  Cardiovascular:     Rate and Rhythm: Normal rate.     Heart sounds: No murmur heard. Pulmonary:     Effort: No respiratory distress.     Breath sounds: No stridor. No wheezing or rhonchi.  Musculoskeletal:     Cervical back: No rigidity or tenderness.  Neurological:     Mental Status: She is alert.  Psychiatric:        Mood and Affect: Mood normal.       07/07/2021    9:00 AM  Results of the Epworth flowsheet  Sitting and reading 3  Watching TV 3  Sitting, inactive in a public place (e.g. a theatre or a meeting) 1  As a passenger in a car for an hour without a break 3  Lying down to rest in the afternoon when circumstances permit 3  Sitting and talking to someone 0  Sitting quietly after a lunch without alcohol 2  In a car, while stopped for a few minutes in traffic 0  Total score 15    Data Reviewed: No previous sleep study available  Chest x-ray 12/20/2020 shows no acute infiltrate-reviewed by myself  Echocardiogram 05/31/2020-ejection fraction of 50 to 55%, normal right ventricular function  Assessment:  Moderate probability of significant obstructive sleep apnea  Excessive daytime sleepiness -This is likely related to untreated sleep disordered breathing  Pathophysiology of sleep disordered breathing discussed with the patient Treatment options for sleep disordered breathing discussed with the patient  Consider  pulmonary function test if patient still has significant chest discomfort/shortness of breath/wheezing  Encourage use of albuterol as needed up to 4 times a day as needed  Graded activities as tolerated  Encourage weight loss efforts  Plan/Recommendations: Schedule patient for an in lab polysomnogram  A home sleep study would be suboptimal as patient denies significant symptoms at present  Encourage weight loss efforts  Graded exercises as tolerated   Virl Diamond MD [facility] 07/07/2021, 9:57 AM  CC: Santiago Glad, MD

## 2021-07-18 ENCOUNTER — Encounter: Payer: Self-pay | Admitting: Pulmonary Disease

## 2021-08-18 ENCOUNTER — Ambulatory Visit (INDEPENDENT_AMBULATORY_CARE_PROVIDER_SITE_OTHER): Payer: No Typology Code available for payment source | Admitting: Podiatry

## 2021-08-18 ENCOUNTER — Encounter: Payer: Self-pay | Admitting: Podiatry

## 2021-08-18 DIAGNOSIS — M76821 Posterior tibial tendinitis, right leg: Secondary | ICD-10-CM

## 2021-08-18 DIAGNOSIS — R202 Paresthesia of skin: Secondary | ICD-10-CM | POA: Insufficient documentation

## 2021-08-18 DIAGNOSIS — L603 Nail dystrophy: Secondary | ICD-10-CM | POA: Diagnosis not present

## 2021-08-18 DIAGNOSIS — K8020 Calculus of gallbladder without cholecystitis without obstruction: Secondary | ICD-10-CM | POA: Insufficient documentation

## 2021-08-18 DIAGNOSIS — M797 Fibromyalgia: Secondary | ICD-10-CM | POA: Insufficient documentation

## 2021-08-18 DIAGNOSIS — E538 Deficiency of other specified B group vitamins: Secondary | ICD-10-CM | POA: Insufficient documentation

## 2021-08-18 DIAGNOSIS — R253 Fasciculation: Secondary | ICD-10-CM | POA: Insufficient documentation

## 2021-08-18 MED ORDER — NEOMYCIN-POLYMYXIN-HC 3.5-10000-1 OT SUSP: OTIC | 0 refills | Status: DC

## 2021-08-18 NOTE — Progress Notes (Signed)
  Subjective:  Patient ID: Marianna Payment, female    DOB: [birth year],  MRN: [id number]  Chief Complaint  Patient presents with   Ingrown Toenail        (NP) L ingrown toe nail, great toe,    Tendonitis    R ankle pain, has fibromyalgia     46 y.o. female presents with the above complaint. History confirmed with patient.  She previously has had the portion of an ingrown toenail on the left great toe removal for.  She saw Dr. Elijah Birk for this.  The nail has not grown right for some time.  Its been fairly painful and she is tired of dealing with it.  She also has had pain in the inside of the right ankle it clicks quite a bit but this is not a new issue the pain however is new and has been heightened by her ongoing fibromyalgia  Objective:  Physical Exam: warm, good capillary refill, no trophic changes or ulcerative lesions, normal DP and PT pulses, and normal sensory exam. Left Foot:  Dystrophic left hallux nail Right Foot:  She has mild tenderness over the PT tendon with resisted inversion and palpation, there is clicking of the tendon in the sheath with circumduction of the foot and ankle  Assessment:   1. Dystrophic nail   2. Posterior tibial tendinitis of right lower extremity      Plan:  Patient was evaluated and treated and all questions answered.  I suspect the pain and clicking she is feeling on the inside of the ankle is PT tendinitis.  This is benign by her fibromyalgia.  I recommended home physical therapy and I dispensed her a plan for this.   Regarding her left hallux nail it was severely dystrophic and I do not think partial permanent matricectomy further will offer her any relief.  She is okay with losing the nail and getting rid of it at this point.  Following verbal consent and a digital block with 1.5 cc each of 2% lidocaine and 0.5% Marcaine plain, the left hallux nail was removed with an elevator and the nail matrix and bed was destroyed using 3 applications of phenol  followed by alcohol irrigation and dressing to the bandage.  Post care instructions given.  Return as needed if this does not improve.  Return if symptoms worsen or fail to improve.

## 2021-08-18 NOTE — Patient Instructions (Addendum)
Soak Instructions    THE DAY AFTER THE PROCEDURE  Place 1/4 cup of epsom salts (or betadine, or white vinegar) in a quart of warm tap water.  Submerge your foot or feet with outer bandage intact for the initial soak; this will allow the bandage to become moist and wet for easy lift off.  Once you remove your bandage, continue to soak in the solution for 20 minutes.  This soak should be done twice a day.  Next, remove your foot or feet from solution, blot dry the affected area and cover.  You may use a band aid large enough to cover the area or use gauze and tape.  Apply other medications to the area as directed by the doctor such as polysporin neosporin.  IF YOUR SKIN BECOMES IRRITATED WHILE USING THESE INSTRUCTIONS, IT IS OKAY TO SWITCH TO  WHITE VINEGAR AND WATER. Or you may use antibacterial soap and water to keep the toe clean  Monitor for any signs/symptoms of infection. Call the office immediately if any occur or go directly to the emergency room. Call with any questions/concerns.    [facility] Instructions-Post Nail Surgery  You have had your ingrown toenail and root treated with a chemical.  This chemical causes a burn that will drain and ooze like a blister.  This can drain for 6-8 weeks or longer.  It is important to keep this area clean, covered, and follow the soaking instructions dispensed at the time of your surgery.  This area will eventually dry and form a scab.  Once the scab forms you no longer need to soak or apply a dressing.  If at any time you experience an increase in pain, redness, swelling, or drainage, you should contact the office as soon as possible.    Posterior Tibial Tendinitis  Posterior tibial tendinitis is irritation of a tendon called the posterior tibial tendon. Your posterior tibial tendon is a cord-like tissue that connects bones of your lower leg and foot to a muscle that: Supports your arch. Helps you raise up on your toes. Helps you turn your foot  down and in. This condition causes foot and ankle pain. It can also lead to a flat foot. What are the causes? This condition is most often caused by repeated stress to the tendon (overuse injury). It can also be caused by a sudden injury that stresses the tendon, such as landing on your foot after jumping or falling. What increases the risk? This condition is more likely to develop in: People who play a sport that involves putting a lot of pressure on the feet, such as: Basketball. Tennis. Soccer. Hockey. Runners. Females who are older than 45 years of age and are overweight. People with diabetes. People with decreased foot stability. People with flat feet. What are the signs or symptoms? Symptoms include: Pain in the inner ankle. Pain at the arch of your foot. Pain that gets worse with running, walking, or standing. Swelling on the inside of your ankle and foot. Weakness in your ankle or foot. Inability to stand up on tiptoe. Flattening of the arch of your foot. How is this diagnosed? This condition may be diagnosed based on: Your symptoms. Your medical history. A physical exam. Tests, such as: X-ray. MRI. Ultrasound. How is this treated? This condition may be treated by: Putting ice to the injured area. Taking NSAIDs, such as ibuprofen, to reduce pain and swelling. Wearing a special shoe or shoe insert to support your arch (orthotic).  Having physical therapy. Replacing high-impact exercise with low-impact exercise, such as swimming or cycling. If your symptoms do not improve with these treatments, you may need to wear a splint, removable walking boot, or short leg cast for 6-8 weeks to keep your foot and ankle still (immobilized). Follow these instructions at home: If you have a cast, splint, or boot: Keep it clean and dry. Check the skin around it every day. Tell your health care provider about any concerns. If you have a cast: Do not stick anything inside it to  scratch your skin. Doing that increases your risk of infection. You may put lotion on dry skin around the edges of the cast. Do not put lotion on the skin underneath the cast. If you have a splint or boot: Wear it as told by your health care provider. Remove it only as told by your health care provider. Loosen it if your toes tingle, become numb, or turn cold and blue. Bathing Do not take baths, swim, or use a hot tub until your health care provider approves. Ask your health care provider if you may take showers. If your cast, splint, or boot is not waterproof: Do not let it get wet. Cover it with a waterproof covering while you take a bath or a shower. Managing pain and swelling   If directed, put ice on the injured area. If you have a removable splint or boot, remove it as told by your health care provider. Put ice in a plastic bag. Place a towel between your skin and the bag or between your cast and the bag. Leave the ice on for 20 minutes, 2-3 times a day. Move your toes often to reduce stiffness and swelling. Raise (elevate) the injured area above the level of your heart while you are sitting or lying down. Activity Do not use the injured foot to support your body weight until your health care provider says that you can. Use crutches as told by your health care provider. Do not do activities that make pain or swelling worse. Ask your health care provider when it is safe to drive if you have a cast, splint, or boot on your foot. Return to your normal activities as told by your health care provider. Ask your health care provider what activities are safe for you. Do exercises as told by your health care provider. General instructions Take over-the-counter and prescription medicines only as told by your health care provider. If you have an orthotic, use it as told by your health care provider. Keep all follow-up visits as told by your health care provider. This is important. How is  this prevented? Wear footwear that is appropriate to your athletic activity. Avoid athletic activities that cause pain or swelling in your ankle or foot. Before being active, do range-of-motion and stretching exercises. If you develop pain or swelling while training, stop training. If you have pain or swelling that does not improve after a few days of rest, see your health care provider. If you start a new athletic activity, start gradually so you can build up your strength and flexibility. Contact a health care provider if: Your symptoms get worse. Your symptoms do not improve in 6-8 weeks. You develop new, unexplained symptoms. Your splint, boot, or cast gets damaged. Summary Posterior tibial tendinitis is irritation of a tendon called the posterior tibial tendon. This condition is most often caused by repeated stress to the tendon (overuse injury). This condition causes foot pain and ankle pain.  It can also lead to a flat foot. This condition may be treated by not doing high-impact activities, applying ice, having physical therapy, wearing orthotics, and wearing a cast, splint, or boot if needed. This information is not intended to replace advice given to you by your health care provider. Make sure you discuss any questions you have with your health care provider. Document Revised: 05/17/2018 Document Reviewed: 03/24/2018 Else[employer] Patient Education  [place].  Posterior Tibial Tendinitis Rehab Ask your health care provider which exercises are safe for you. Do exercises exactly as told by your health care provider and adjust them as directed. It is normal to feel mild stretching, pulling, tightness, or discomfort as you do these exercises. Stop right away if you feel sudden pain or your pain gets worse. Do not begin these exercises until told by your health care provider. Stretching and range-of-motion exercises These exercises warm up your muscles and joints and improve the  movement and flexibility in your ankle and foot. These exercises may also help to relieve pain. Standing wall calf stretch, knee straight   Stand with your hands against a wall. Extend your left / right leg behind you, and bend your front knee slightly. If directed, place a folded washcloth under the arch of your foot for support. Point the toes of your back foot slightly inward. Keeping your heels on the floor and your back knee straight, shift your weight toward the wall. Do not allow your back to arch. You should feel a gentle stretch in your upper left / right calf. Hold this position for 10 seconds. Repeat 10 times. Complete this exercise 2 times a day. Standing wall calf stretch, knee bent Stand with your hands against a wall. Extend your left / right leg behind you, and bend your front knee slightly. If directed, place a folded washcloth under the arch of your foot for support. Point the toes of your back foot slightly inward. Unlock your back knee so it is bent. Keep your heels on the floor. You should feel a gentle stretch deep in your lower left / right calf. Hold this position for 10 seconds. Repeat 10 times. Complete this exercise 2 times a day. Strengthening exercises These exercises build strength and endurance in your ankle and foot. Endurance is the ability to use your muscles for a Britaney Espaillat time, even after they get tired. Ankle inversion with band Secure one end of a rubber exercise band or tubing to a fixed object, such as a table leg or a pole, that will stay still when the band is pulled. Loop the other end of the band around the middle of your left / right foot. Sit on the floor facing the object with your left / right leg extended. The band or tube should be slightly tense when your foot is relaxed. Leading with your big toe, slowly bring your left / right foot and ankle inward, toward your other foot (inversion). Hold this position for 10 seconds. Slowly return your foot  to the starting position. Repeat 10 times. Complete this exercise 2 times a day. Towel curls   Sit in a chair on a non-carpeted surface, and put your feet on the floor. Place a towel in front of your feet. Keeping your heel on the floor, put your left / right foot on the towel. Pull the towel toward you by grabbing the towel with your toes and curling them under. Keep your heel on the floor while you do this.  Let your toes relax. Grab the towel with your toes again. Keep going until the towel is completely underneath your foot. Repeat 10 times. Complete this exercise 2 times a day. Balance exercise This exercise improves or maintains your balance. Balance is important in preventing falls. Single leg stand Without wearing shoes, stand near a railing or in a doorway. You may hold on to the railing or door frame as needed for balance. Stand on your left / right foot. Keep your big toe down on the floor and try to keep your arch lifted. If balancing in this position is too easy, try the exercise with your eyes closed or while standing on a pillow. Hold this position for 10 seconds. Repeat 10 times. Complete this exercise 2 times a day. This information is not intended to replace advice given to you by your health care provider. Make sure you discuss any questions you have with your health care provider.

## 2021-08-29 ENCOUNTER — Telehealth: Payer: Self-pay | Admitting: *Deleted

## 2021-08-29 MED ORDER — CEPHALEXIN 500 MG PO CAPS: 500.0000 mg | ORAL_CAPSULE | Freq: Three times a day (TID) | ORAL | 0 refills | Status: DC

## 2021-08-29 NOTE — Telephone Encounter (Signed)
Spoke to patient, advised to keep soaking, sent Rx for keflex

## 2021-08-29 NOTE — Telephone Encounter (Signed)
Patient is calling because her post procedural toe started to become painful, red, possible pus surrounding border,unable to sleep. Please advise

## 2021-09-01 ENCOUNTER — Telehealth: Payer: Self-pay | Admitting: *Deleted

## 2021-09-01 NOTE — Telephone Encounter (Signed)
Spoke with patient and she did receive her antibiotic, toe is doing better.

## 2021-09-17 ENCOUNTER — Ambulatory Visit (HOSPITAL_BASED_OUTPATIENT_CLINIC_OR_DEPARTMENT_OTHER): Payer: No Typology Code available for payment source | Attending: Pulmonary Disease | Admitting: Pulmonary Disease

## 2021-09-17 DIAGNOSIS — G478 Other sleep disorders: Secondary | ICD-10-CM | POA: Insufficient documentation

## 2021-09-17 DIAGNOSIS — G4719 Other hypersomnia: Secondary | ICD-10-CM | POA: Insufficient documentation

## 2021-09-17 DIAGNOSIS — R0683 Snoring: Secondary | ICD-10-CM | POA: Diagnosis present

## 2021-10-04 ENCOUNTER — Telehealth: Payer: Self-pay | Admitting: Pulmonary Disease

## 2021-10-04 NOTE — Procedures (Signed)
POLYSOMNOGRAPHY  Last, First: Tonya, Andrews MRN: [id number] Gender: Female Age (years): 46 Weight (lbs): 178 DOB: Sep 02, 1975 BMI: 30 Primary Care: No PCP Epworth Score: 9 Referring: Tomma Lightning MD Technician: Ulyess Mort Interpreting: Tomma Lightning MD Study Type: NPSG Ordered Study Type: NPSG Study date: 09/17/2021 Location: [place] CLINICAL INFORMATION Tonya Andrews is a 46 year old Female and was referred to the sleep center for evaluation of G47.80 Other Sleep Disorders. Indications include Daytime Fatigue, Fatigue, Obesity.  MEDICATIONS Patient self administered medications include: N/A. Medications administered during study include No sleep medicine administered.  SLEEP STUDY TECHNIQUE A multi-channel overnight Polysomnography study was performed. The channels recorded and monitored were central and occipital EEG, electrooculogram (EOG), submentalis EMG (chin), nasal and oral airflow, thoracic and abdominal wall motion, anterior tibialis EMG, snore microphone, electrocardiogram, and a pulse oximetry. TECHNICIAN COMMENTS Comments added by Technician: PATIENT WAS ORDERED AS A NPSG ONLY. Comments added by Scorer: N/A SLEEP ARCHITECTURE The study was initiated at 9:45:50 PM and terminated at 4:40:35 AM. The total recorded time was 414.7 minutes. EEG confirmed total sleep time was 366 minutes yielding a sleep efficiency of 88.2%%. Sleep onset after lights out was 17.2 minutes with a REM latency of 350.0 minutes. The patient spent 17.5%% of the night in stage N1 sleep, 76.1%% in stage N2 sleep, 0.0%% in stage N3 and 6.4% in REM. Wake after sleep onset (WASO) was 31.6 minutes. The Arousal Index was 61.5/hour. RESPIRATORY PARAMETERS There were a total of 6 respiratory disturbances out of which 0 were apneas ( 0 obstructive, 0 mixed, 0 central) and 6 hypopneas. The apnea/hypopnea index (AHI) was 1.0 events/hour. The central sleep apnea index was 0 events/hour. The REM AHI was  0.0 events/hour and NREM AHI was 1.1 events/hour. The supine AHI was 3.6 events/hour and the non supine AHI was 0.6 supine during 13.80% of sleep. Respiratory disturbances were associated with oxygen desaturation down to a nadir of 87.0% during sleep. The mean oxygen saturation during the study was 94.4%. The cumulative time under 88% oxygen saturation was 5.5 minutes.  LEG MOVEMENT DATA The total leg movements were 24 with a resulting leg movement index of 3.9/hr .Associated arousal with leg movement index was 0.3/hr.  CARDIAC DATA The underlying cardiac rhythm was most consistent with sinus rhythm. Mean heart rate during sleep was 79.2 bpm. Additional rhythm abnormalities include None.  IMPRESSIONS - No Significant Obstructive Sleep apnea(OSA) - EKG showed no cardiac abnormalities. - Mild Oxygen Desaturation - The patient snored with soft snoring volume. - No significant periodic leg movements(PLMs) during sleep. However, no significant associated arousals.  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 - Sleep hygiene should be reviewed to assess factors that may improve sleep quality. - Weight management and regular exercise should be initiated or continued.  [Electronically signed] 10/04/2021 12:51 PM  Virl Diamond MD NPI: [id number]

## 2021-10-04 NOTE — Telephone Encounter (Signed)
Call patient  Sleep study result  Date of study: 09/17/2021  Impression: Negative for significant sleep disordered breathing  Recommendation:  Clinical monitoring of symptoms  Encourage weight loss efforts  Sleep with the head of bed elevated, encourage lateral sleep  Follow-up as previously scheduled

## 2021-10-08 NOTE — Telephone Encounter (Signed)
I called the patient and she is willing to try the sleep modification and nothing further is needed. She will call back for a follow up if she has more sleep trouble.

## 2022-02-04 DIAGNOSIS — E538 Deficiency of other specified B group vitamins: Secondary | ICD-10-CM | POA: Diagnosis not present

## 2022-02-17 DIAGNOSIS — E538 Deficiency of other specified B group vitamins: Secondary | ICD-10-CM | POA: Diagnosis not present

## 2022-02-24 NOTE — Progress Notes (Unsigned)
New Patient Note  RE: Tonya Andrews MRN: [id number] DOB: 21-May-1975 Date of Office Visit: 02/25/2022  Consult requested by: Tonya Palmer, MD Primary care provider: Mila Palmer, MD  Chief Complaint: No chief complaint on file.  History of Present Illness: I had the pleasure of seeing Tonya Andrews for initial evaluation at the [facility] on 02/24/2022. She is a 47 y.o. female, who is referred here by Tonya Palmer, MD for the evaluation of gluten sensitivity.  She reports food allergy to ***. The reaction occurred at the age of ***, after she ate *** amount of ***. Symptoms started within *** and was in the form of *** hives, swelling, wheezing, abdominal pain, diarrhea, vomiting. ***Denies any associated cofactors such as exertion, infection, NSAID use, or alcohol consumption. The symptoms lasted for ***. She was evaluated in ED and received ***. Since this episode, she does *** not report other accidental exposures to ***. She does *** not have access to epinephrine autoinjector and *** needed to use it.   Past work up includes: ***. Dietary History: patient has been eating other foods including ***milk, ***eggs, ***peanut, ***treenuts, ***sesame, ***shellfish, ***fish, ***soy, ***wheat, ***meats, ***fruits and ***vegetables.  She reports reading labels and avoiding *** in diet completely. She tolerates ***baked egg and baked milk products.   Assessment and Plan: Tonya Andrews is a 47 y.o. female with: No problem-specific Assessment & Plan notes found for this encounter.  No follow-ups on file.  No orders of the defined types were placed in this encounter.  Lab Orders  No laboratory test(s) ordered today    Other allergy screening: Asthma: {Blank single:19197::"yes","no"} Rhino conjunctivitis: {Blank single:19197::"yes","no"} Food allergy: {Blank single:19197::"yes","no"} Medication allergy: {Blank single:19197::"yes","no"} Hymenoptera allergy: {Blank  single:19197::"yes","no"} Urticaria: {Blank single:19197::"yes","no"} Eczema:{Blank single:19197::"yes","no"} History of recurrent infections suggestive of immunodeficency: {Blank single:19197::"yes","no"}  Diagnostics: Spirometry:  Tracings reviewed. Her effort: {Blank single:19197::"Good reproducible efforts.","It was hard to get consistent efforts and there is a question as to whether this reflects a maximal maneuver.","Poor effort, data can not be interpreted."} FVC: ***L FEV1: ***L, ***% predicted FEV1/FVC ratio: ***% Interpretation: {Blank single:19197::"Spirometry consistent with mild obstructive disease","Spirometry consistent with moderate obstructive disease","Spirometry consistent with severe obstructive disease","Spirometry consistent with possible restrictive disease","Spirometry consistent with mixed obstructive and restrictive disease","Spirometry uninterpretable due to technique","Spirometry consistent with normal pattern","No overt abnormalities noted given today's efforts"}.  Please see scanned spirometry results for details.  Skin Testing: {Blank single:19197::"Select foods","Environmental allergy panel","Environmental allergy panel and select foods","Food allergy panel","None","Deferred due to recent antihistamines use"}. *** Results discussed with patient/family.   Past Medical History: Patient Active Problem List   Diagnosis Date Noted  . Excessive daytime sleepiness 09/17/2021  . Cholelithiasis without obstruction 08/18/2021  . Fibromyalgia 08/18/2021  . Paresthesia of arm 08/18/2021  . Twitching 08/18/2021  . Vitamin B12 deficiency (non anemic) 08/18/2021  . Cerebrovascular accident (HCC) 04/18/2020  . PFO (patent foramen ovale) 05/03/2009  . LOW HDL 05/02/2009  . History of cardioembolic cerebrovascular accident (CVA) 05/02/2009  . Migraine 05/02/2009   Past Medical History:  Diagnosis Date  . Fibromyalgia   . Gallstones   . Migraine headache    Dr  Neale Burly  . Nasal polyp   . Nerve pain   . PFO (patent foramen ovale) 04/2009   by bubble study/TEE, Dr Excell Seltzer, cardiology  . Pneumonia 2008  . PONV (postoperative nausea and vomiting)   . Seasonal allergies   . Stroke [facility]) 04/2009, 07/2009   Dr Pearlean Brownie   Past Surgical History:  Past Surgical History:  Procedure Laterality Date  . CHOLECYSTECTOMY N/A 05/30/2019   Procedure: LAPAROSCOPIC CHOLECYSTECTOMY;  Surgeon: Greer Pickerel, MD;  Location: [facility] ORS;  Service: General;  Laterality: N/A;  . KNEE SURGERY Right    x2  . PATENT FORAMEN OVALE(PFO) CLOSURE  09/2009   transcath closure   . WISDOM TOOTH EXTRACTION     Medication List:  Current Outpatient Medications  Medication Sig Dispense Refill  . albuterol (VENTOLIN HFA) 108 (90 Base) MCG/ACT inhaler Inhale 1-2 puffs into the lungs every 6 (six) hours as needed for wheezing or shortness of breath. 8.5 g 0  . cephALEXin (KEFLEX) 500 MG capsule Take 1 capsule (500 mg total) by mouth 3 (three) times daily. 30 capsule 0  . cetirizine (ZYRTEC) 10 MG tablet Take 10 mg by mouth every evening.     . cyclobenzaprine (FLEXERIL) 10 MG tablet Take 10 mg by mouth 2 (two) times daily as needed.    . DULoxetine (CYMBALTA) 30 MG capsule Take 30 mg by mouth daily.    . fluticasone (FLONASE) 50 MCG/ACT nasal spray Place 2 sprays into both nostrils daily for 7 days. 16 g 2  . Multiple Vitamins-Minerals (MULTIVITAMIN WITH MINERALS) tablet Take 1 tablet by mouth daily.    Marland Kitchen neomycin-polymyxin-hydrocortisone (CORTISPORIN) 3.5-10000-1 OTIC suspension Apply 1-2 drops daily after soaking and cover with bandaid 10 mL 0  . sodium chloride (OCEAN) 0.65 % SOLN nasal spray Place 1 spray into both nostrils as needed for congestion. 88 mL 0  . topiramate (TOPAMAX) 200 MG tablet Take 200 mg by mouth every evening.      No current facility-administered medications for this visit.   Allergies: Allergies  Allergen Reactions  . Amoxicillin Nausea And Vomiting    Does not  ever want again. Made her feel bad.    . Nifedipine Other (See Comments)  . Rho D Immune Globulin Other (See Comments)  . Latex Other (See Comments)    Unknown to patient, "happened during surgery"   Social History: Social History   Socioeconomic History  . Marital status: Married    Spouse name: Not on file  . Number of children: 3  . Years of education: masters  . 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 Smoking status: Never  . Smokeless tobacco: Never  Vaping Use  . Vaping Use: Never used  Substance and Sexual Activity  . Alcohol use: Yes    Comment: 1 wine  . Drug use: No  . Sexual activity: Not on file    Comment: Vasectomy  Other Topics Concern  . Not on file  Social History Narrative   Lives home with family   Caffeine- coffee, 1 daily   Social Determinants of Health   Financial Resource Strain: Not on file  Food Insecurity: Not on file  Transportation Needs: Not on file  Physical Activity: Not on file  Stress: Not on file  Social Connections: Not on file   Lives in a ***. Smoking: *** Occupation: ***  Environmental HistoryFreight forwarder in the house: Estate agent in the family room: {Blank single:19197::"yes","no"} Carpet in the bedroom: {Blank single:19197::"yes","no"} Heating: {Blank single:19197::"electric","gas","heat pump"} Cooling: {Blank single:19197::"central","window","heat pump"} Pet: {Blank single:19197::"yes ***","no"}  Family History: Family History  Problem Relation Age of Onset  . Hypertension Mother   . Arthritis/Rheumatoid Father    Problem  Relation Asthma                                   *** Eczema                                *** Food allergy                          *** Allergic rhino conjunctivitis     ***  Review of Systems  Constitutional:  Negative for appetite change, chills, fever and  unexpected weight change.  HENT:  Negative for congestion and rhinorrhea.   Eyes:  Negative for itching.  Respiratory:  Negative for cough, chest tightness, shortness of breath and wheezing.   Cardiovascular:  Negative for chest pain.  Gastrointestinal:  Negative for abdominal pain.  Genitourinary:  Negative for difficulty urinating.  Skin:  Negative for rash.  Neurological:  Negative for headaches.   Objective: There were no vitals taken for this visit. There is no height or weight on file to calculate BMI. Physical Exam Vitals and nursing note reviewed.  Constitutional:      Appearance: Normal appearance. She is well-developed.  HENT:     Head: Normocephalic and atraumatic.     Right Ear: Tympanic membrane and external ear normal.     Left Ear: Tympanic membrane and external ear normal.     Nose: Nose normal.     Mouth/Throat:     Mouth: Mucous membranes are moist.     Pharynx: Oropharynx is clear.  Eyes:     Conjunctiva/sclera: Conjunctivae normal.  Cardiovascular:     Rate and Rhythm: Normal rate and regular rhythm.     Heart sounds: Normal heart sounds. No murmur heard.    No friction rub. No gallop.  Pulmonary:     Effort: Pulmonary effort is normal.     Breath sounds: Normal breath sounds. No wheezing, rhonchi or rales.  Musculoskeletal:     Cervical back: Neck supple.  Skin:    General: Skin is warm.     Findings: No rash.  Neurological:     Mental Status: She is alert and oriented to person, place, and time.  Psychiatric:        Behavior: Behavior normal.  The plan was reviewed with the patient/family, and all questions/concerned were addressed.  It was my pleasure to see Tonya Andrews today and participate in her care. Please feel free to contact me with any questions or concerns.  Sincerely,  Rexene Alberts, DO Allergy & Immunology  [facility] office: [place] office: [phone]

## 2022-02-25 ENCOUNTER — Ambulatory Visit (INDEPENDENT_AMBULATORY_CARE_PROVIDER_SITE_OTHER): Payer: BC Managed Care – PPO | Admitting: Allergy

## 2022-02-25 ENCOUNTER — Encounter: Payer: Self-pay | Admitting: Allergy

## 2022-02-25 VITALS — BP 110/80 | HR 80 | Temp 98.4°F | Resp 16 | Ht 65.0 in | Wt 187.5 lb

## 2022-02-25 DIAGNOSIS — Z713 Dietary counseling and surveillance: Secondary | ICD-10-CM

## 2022-02-25 DIAGNOSIS — R198 Other specified symptoms and signs involving the digestive system and abdomen: Secondary | ICD-10-CM | POA: Diagnosis not present

## 2022-02-25 DIAGNOSIS — J45909 Unspecified asthma, uncomplicated: Secondary | ICD-10-CM | POA: Insufficient documentation

## 2022-02-25 DIAGNOSIS — K219 Gastro-esophageal reflux disease without esophagitis: Secondary | ICD-10-CM

## 2022-02-25 DIAGNOSIS — J3089 Other allergic rhinitis: Secondary | ICD-10-CM

## 2022-02-25 DIAGNOSIS — J4520 Mild intermittent asthma, uncomplicated: Secondary | ICD-10-CM | POA: Diagnosis not present

## 2022-02-25 NOTE — Assessment & Plan Note (Signed)
Needs to use albuterol during respiratory infections with good benefit. Today's spirometry was normal. May use albuterol rescue inhaler 2 puffs every 4 to 6 hours as needed for shortness of breath, chest tightness, coughing, and wheezing.  Monitor frequency of use.

## 2022-02-25 NOTE — Assessment & Plan Note (Addendum)
Patient concerned if food allergies are contributing to her GI issues - follows with GI for abdominal pains and diarrhea. Eliminated gluten from diet after some type of test came back positive to celiac and has been feeling better but still has symptoms. Sesame seed cause abdominal pain, soy sauce cause diarrhea (okay with tofu), pineapple cause rash. Discussed with patient that skin prick testing and bloodwork (food IgE levels) check for IgE mediated reactions which her clinical presentation does not support. Patient still interested in undergoing food skin testing today. Today's skin testing showed: Negative to food panel.  Keep a food journal with symptoms and foods eaten. Avoid foods that are bothersome - gluten, soy sauce, sesame and pineapple.  Keep follow up with GI.

## 2022-02-25 NOTE — Assessment & Plan Note (Signed)
See handout for lifestyle and dietary modifications. Continue to follow up with GI - cont OTC med as recommended by them.

## 2022-02-25 NOTE — Assessment & Plan Note (Signed)
Rhinitis symptoms which is controlled by Zyrtec and Flonase.  No prior allergy/ENT evaluation. Today's skin prick testing showed: Positive to dust mites only. Start environmental control measures as below. Use over the counter antihistamines such as Zyrtec (cetirizine), Claritin (loratadine), Allegra (fexofenadine), or Xyzal (levocetirizine) daily as needed. May take twice a day during allergy flares. May switch antihistamines every few months. Use Flonase (fluticasone) nasal spray 1 spray per nostril twice a day as needed for nasal congestion.  Nasal saline spray (i.e., Simply Saline) or nasal saline lavage (i.e., NeilMed) is recommended as needed and prior to medicated nasal sprays.

## 2022-02-25 NOTE — Patient Instructions (Addendum)
Today's skin testing showed: Positive to dust mites only. Negative to food panel.   Results given.  Food:  Discussed with patient that skin prick testing and bloodwork (food IgE levels) check for IgE mediated reactions which her clinical presentation does not support.  Keep a food journal with symptoms and foods eaten. Avoid foods that are bothersome - gluten, soy sauce, sesame and pineapple.   Breathing Normal breathing test today. May use albuterol rescue inhaler 2 puffs every 4 to 6 hours as needed for shortness of breath, chest tightness, coughing, and wheezing.  Monitor frequency of use.   Heartburn: See handout for lifestyle and dietary modifications. Continue to follow up with GI.   Environmental allergies Start environmental control measures as below. Use over the counter antihistamines such as Zyrtec (cetirizine), Claritin (loratadine), Allegra (fexofenadine), or Xyzal (levocetirizine) daily as needed. May take twice a day during allergy flares. May switch antihistamines every few months. Use Flonase (fluticasone) nasal spray 1 spray per nostril twice a day as needed for nasal congestion.  Nasal saline spray (i.e., Simply Saline) or nasal saline lavage (i.e., NeilMed) is recommended as needed and prior to medicated nasal sprays.  Follow up as needed.  Control of House Dust Mite Allergen Dust mite allergens are a common trigger of allergy and asthma symptoms. While they can be found throughout the house, these microscopic creatures thrive in warm, humid environments such as bedding, upholstered furniture and carpeting. Because so much time is spent in the bedroom, it is essential to reduce mite levels there.  Encase pillows, mattresses, and box springs in special allergen-proof fabric covers or airtight, zippered plastic covers.  Bedding should be washed weekly in hot water (130 F) and dried in a hot dryer. Allergen-proof covers are available for comforters and pillows that  can't be regularly washed.  Wash the allergy-proof covers every few months. Minimize clutter in the bedroom. Keep pets out of the bedroom.  Keep humidity less than 50% by using a dehumidifier or air conditioning. You can buy a humidity measuring device called a hygrometer to monitor this.  If possible, replace carpets with hardwood, linoleum, or washable area rugs. If that's not possible, vacuum frequently with a vacuum that has a HEPA filter. Remove all upholstered furniture and non-washable window drapes from the bedroom. Remove all non-washable stuffed toys from the bedroom.  Wash stuffed toys weekly.

## 2022-03-02 DIAGNOSIS — B9689 Other specified bacterial agents as the cause of diseases classified elsewhere: Secondary | ICD-10-CM | POA: Diagnosis not present

## 2022-03-02 DIAGNOSIS — J019 Acute sinusitis, unspecified: Secondary | ICD-10-CM | POA: Diagnosis not present

## 2022-03-02 DIAGNOSIS — J029 Acute pharyngitis, unspecified: Secondary | ICD-10-CM | POA: Diagnosis not present

## 2022-03-03 DIAGNOSIS — R768 Other specified abnormal immunological findings in serum: Secondary | ICD-10-CM | POA: Diagnosis not present

## 2022-03-03 DIAGNOSIS — M7989 Other specified soft tissue disorders: Secondary | ICD-10-CM | POA: Diagnosis not present

## 2022-03-03 DIAGNOSIS — G894 Chronic pain syndrome: Secondary | ICD-10-CM | POA: Diagnosis not present

## 2022-03-03 DIAGNOSIS — M25552 Pain in left hip: Secondary | ICD-10-CM | POA: Diagnosis not present

## 2022-03-05 DIAGNOSIS — E538 Deficiency of other specified B group vitamins: Secondary | ICD-10-CM | POA: Diagnosis not present

## 2022-03-05 DIAGNOSIS — M797 Fibromyalgia: Secondary | ICD-10-CM | POA: Diagnosis not present

## 2022-03-09 DIAGNOSIS — J0100 Acute maxillary sinusitis, unspecified: Secondary | ICD-10-CM | POA: Diagnosis not present

## 2022-03-09 DIAGNOSIS — R059 Cough, unspecified: Secondary | ICD-10-CM | POA: Diagnosis not present

## 2022-04-03 ENCOUNTER — Ambulatory Visit: Payer: BC Managed Care – PPO | Admitting: Diagnostic Neuroimaging

## 2022-04-30 DIAGNOSIS — R051 Acute cough: Secondary | ICD-10-CM | POA: Diagnosis not present

## 2022-04-30 DIAGNOSIS — J329 Chronic sinusitis, unspecified: Secondary | ICD-10-CM | POA: Diagnosis not present

## 2022-06-03 DIAGNOSIS — N760 Acute vaginitis: Secondary | ICD-10-CM | POA: Diagnosis not present

## 2022-06-16 DIAGNOSIS — G43719 Chronic migraine without aura, intractable, without status migrainosus: Secondary | ICD-10-CM | POA: Diagnosis not present

## 2022-07-07 DIAGNOSIS — Z01419 Encounter for gynecological examination (general) (routine) without abnormal findings: Secondary | ICD-10-CM | POA: Diagnosis not present

## 2022-07-07 DIAGNOSIS — Z1231 Encounter for screening mammogram for malignant neoplasm of breast: Secondary | ICD-10-CM | POA: Diagnosis not present

## 2022-07-07 DIAGNOSIS — Z6832 Body mass index (BMI) 32.0-32.9, adult: Secondary | ICD-10-CM | POA: Diagnosis not present

## 2022-07-09 ENCOUNTER — Other Ambulatory Visit: Payer: Self-pay | Admitting: Obstetrics and Gynecology

## 2022-07-09 DIAGNOSIS — R928 Other abnormal and inconclusive findings on diagnostic imaging of breast: Secondary | ICD-10-CM

## 2022-07-20 ENCOUNTER — Ambulatory Visit
Admission: RE | Admit: 2022-07-20 | Discharge: 2022-07-20 | Disposition: A | Discharge status: Home / Self Care | Payer: Self-pay | Source: Ambulatory Visit | Attending: Obstetrics and Gynecology | Admitting: Obstetrics and Gynecology

## 2022-07-20 ENCOUNTER — Ambulatory Visit: Payer: No Typology Code available for payment source

## 2022-07-20 DIAGNOSIS — R922 Inconclusive mammogram: Secondary | ICD-10-CM | POA: Diagnosis not present

## 2022-07-20 DIAGNOSIS — N6489 Other specified disorders of breast: Secondary | ICD-10-CM | POA: Diagnosis not present

## 2022-07-20 DIAGNOSIS — R928 Other abnormal and inconclusive findings on diagnostic imaging of breast: Secondary | ICD-10-CM

## 2022-07-23 DIAGNOSIS — R102 Pelvic and perineal pain: Secondary | ICD-10-CM | POA: Diagnosis not present

## 2022-07-24 ENCOUNTER — Other Ambulatory Visit: Payer: Self-pay | Admitting: Obstetrics and Gynecology

## 2022-07-24 DIAGNOSIS — R1031 Right lower quadrant pain: Secondary | ICD-10-CM

## 2022-08-05 ENCOUNTER — Ambulatory Visit
Admission: RE | Admit: 2022-08-05 | Discharge: 2022-08-05 | Disposition: A | Discharge status: Home / Self Care | Payer: BLUE CROSS/BLUE SHIELD | Source: Ambulatory Visit | Attending: Obstetrics and Gynecology | Admitting: Obstetrics and Gynecology

## 2022-08-05 DIAGNOSIS — R109 Unspecified abdominal pain: Secondary | ICD-10-CM | POA: Diagnosis not present

## 2022-08-05 DIAGNOSIS — N200 Calculus of kidney: Secondary | ICD-10-CM | POA: Diagnosis not present

## 2022-08-05 DIAGNOSIS — R1031 Right lower quadrant pain: Secondary | ICD-10-CM

## 2022-08-05 MED ORDER — IOPAMIDOL (ISOVUE-300) INJECTION 61%
100.0000 mL | Freq: Once | INTRAVENOUS | Status: AC | PRN
  Administered 2022-08-05: 100 mL via INTRAVENOUS

## 2022-08-10 ENCOUNTER — Emergency Department (HOSPITAL_COMMUNITY): Payer: BC Managed Care – PPO

## 2022-08-10 ENCOUNTER — Other Ambulatory Visit: Payer: Self-pay

## 2022-08-10 ENCOUNTER — Emergency Department (HOSPITAL_COMMUNITY)
Admission: EM | Admit: 2022-08-10 | Discharge: 2022-08-11 | Disposition: A | Discharge status: Home / Self Care | Payer: BC Managed Care – PPO | Attending: Emergency Medicine | Admitting: Emergency Medicine

## 2022-08-10 ENCOUNTER — Encounter (HOSPITAL_COMMUNITY): Payer: Self-pay | Admitting: Emergency Medicine

## 2022-08-10 ENCOUNTER — Encounter: Payer: Self-pay | Admitting: Family

## 2022-08-10 DIAGNOSIS — J9811 Atelectasis: Secondary | ICD-10-CM | POA: Diagnosis not present

## 2022-08-10 DIAGNOSIS — J019 Acute sinusitis, unspecified: Secondary | ICD-10-CM | POA: Diagnosis not present

## 2022-08-10 DIAGNOSIS — Q211 Atrial septal defect, unspecified: Secondary | ICD-10-CM | POA: Diagnosis not present

## 2022-08-10 DIAGNOSIS — Z8616 Personal history of COVID-19: Secondary | ICD-10-CM | POA: Insufficient documentation

## 2022-08-10 DIAGNOSIS — R000 Tachycardia, unspecified: Secondary | ICD-10-CM | POA: Diagnosis not present

## 2022-08-10 DIAGNOSIS — Z91040 Latex allergy status: Secondary | ICD-10-CM | POA: Diagnosis not present

## 2022-08-10 DIAGNOSIS — R519 Headache, unspecified: Secondary | ICD-10-CM | POA: Diagnosis not present

## 2022-08-10 DIAGNOSIS — R5383 Other fatigue: Secondary | ICD-10-CM | POA: Diagnosis not present

## 2022-08-10 DIAGNOSIS — U071 COVID-19: Secondary | ICD-10-CM | POA: Diagnosis not present

## 2022-08-10 DIAGNOSIS — R791 Abnormal coagulation profile: Secondary | ICD-10-CM | POA: Insufficient documentation

## 2022-08-10 DIAGNOSIS — J323 Chronic sphenoidal sinusitis: Secondary | ICD-10-CM | POA: Diagnosis not present

## 2022-08-10 DIAGNOSIS — R7989 Other specified abnormal findings of blood chemistry: Secondary | ICD-10-CM | POA: Diagnosis not present

## 2022-08-10 DIAGNOSIS — R531 Weakness: Secondary | ICD-10-CM | POA: Diagnosis not present

## 2022-08-10 NOTE — ED Provider Triage Note (Signed)
Emergency Medicine Provider Triage Evaluation Note  MAYZELL BANKEN , a 47 y.o. female  was evaluated in triage.  Pt complains of d-dimer of 0.53 and tachycardia at doctor's office earlier today told to come here to rule out clot. History of stroke 13 years ago.   Review of Systems  Positive: Fatigue Negative: No blood thinners  Physical Exam  BP (!) 117/90   Pulse 99   Temp 98.3 F (36.8 C)   Resp 17   Ht 5\' 5"  (1.651 m)   Wt 89.4 kg   SpO2 96%   BMI 32.80 kg/m  Gen:   Awake, no distress   Resp:  Normal effort  MSK:   Moves extremities without difficulty  Other:    Medical Decision Making  Medically screening exam initiated at 7:00 PM.  Appropriate orders placed.  Jimmya Bowermaster Roszak was informed that the remainder of the evaluation will be completed by another provider, this initial triage assessment does not replace that evaluation, and the importance of remaining in the ED until their evaluation is complete.   Maxwell Marion, PA-C 08/10/22 1904

## 2022-08-10 NOTE — ED Triage Notes (Signed)
Pt 2 weeks out from covid. Seen at PCP today and told her d-dimer is elevated. Sent for CT scan. Hx of CVA 13 years ago, not on blood thinners now. Endorses general fatigue. Denies SOB.

## 2022-08-11 ENCOUNTER — Emergency Department (HOSPITAL_COMMUNITY): Payer: BC Managed Care – PPO

## 2022-08-11 ENCOUNTER — Encounter (HOSPITAL_COMMUNITY): Payer: Self-pay

## 2022-08-11 DIAGNOSIS — R531 Weakness: Secondary | ICD-10-CM | POA: Diagnosis not present

## 2022-08-11 DIAGNOSIS — Q211 Atrial septal defect, unspecified: Secondary | ICD-10-CM | POA: Diagnosis not present

## 2022-08-11 DIAGNOSIS — J9811 Atelectasis: Secondary | ICD-10-CM | POA: Diagnosis not present

## 2022-08-11 LAB — COMPREHENSIVE METABOLIC PANEL
ALT: 41 U/L (ref 0–44)
AST: 34 U/L (ref 15–41)
Albumin: 3.6 g/dL (ref 3.5–5.0)
Alkaline Phosphatase: 60 U/L (ref 38–126)
Anion gap: 7 (ref 5–15)
BUN: 10 mg/dL (ref 6–20)
CO2: 25 mmol/L (ref 22–32)
Calcium: 9.1 mg/dL (ref 8.9–10.3)
Chloride: 105 mmol/L (ref 98–111)
Creatinine, Ser: 0.78 mg/dL (ref 0.44–1.00)
GFR, Estimated: >60 mL/min (ref >60)
Glucose, Bld: 106 mg/dL — ABNORMAL HIGH (ref 70–99)
Potassium: 3.9 mmol/L (ref 3.5–5.1)
Sodium: 137 mmol/L (ref 135–145)
Total Bilirubin: 0.3 mg/dL (ref 0.3–1.2)
Total Protein: 7.3 g/dL (ref 6.5–8.1)

## 2022-08-11 LAB — CBC WITH DIFFERENTIAL/PLATELET
Abs Immature Granulocytes: 0.03 10*3/uL (ref 0.00–0.07)
Basophils Absolute: 0.1 10*3/uL (ref 0.0–0.1)
Basophils Relative: 1 %
Eosinophils Absolute: 0.2 10*3/uL (ref 0.0–0.5)
Eosinophils Relative: 3 %
HCT: 42 % (ref 36.0–46.0)
Hemoglobin: 13.5 g/dL (ref 12.0–15.0)
Immature Granulocytes: 0 %
Lymphocytes Relative: 38 %
Lymphs Abs: 3.3 10*3/uL (ref 0.7–4.0)
MCH: 30.3 pg (ref 26.0–34.0)
MCHC: 32.1 g/dL (ref 30.0–36.0)
MCV: 94.4 fL (ref 80.0–100.0)
Monocytes Absolute: 0.6 10*3/uL (ref 0.1–1.0)
Monocytes Relative: 7 %
Neutro Abs: 4.5 10*3/uL (ref 1.7–7.7)
Neutrophils Relative %: 51 %
Platelets: 312 10*3/uL (ref 150–400)
RBC: 4.45 MIL/uL (ref 3.87–5.11)
RDW: 12.9 % (ref 11.5–15.5)
WBC: 8.8 10*3/uL (ref 4.0–10.5)
nRBC: 0 % (ref 0.0–0.2)

## 2022-08-11 LAB — URINALYSIS, ROUTINE W REFLEX MICROSCOPIC
Bacteria, UA: NONE SEEN
Bilirubin Urine: NEGATIVE
Glucose, UA: NEGATIVE mg/dL
Ketones, ur: NEGATIVE mg/dL
Leukocytes,Ua: NEGATIVE
Nitrite: NEGATIVE
Protein, ur: NEGATIVE mg/dL
RBC / HPF: >50 RBC/hpf (ref 0–5)
Specific Gravity, Urine: 1.01 (ref 1.005–1.030)
pH: 5 (ref 5.0–8.0)

## 2022-08-11 LAB — D-DIMER, QUANTITATIVE: D-Dimer, Quant: 0.58 ug/mL-FEU — ABNORMAL HIGH (ref 0.00–0.50)

## 2022-08-11 MED ORDER — SODIUM CHLORIDE 0.9 % IV BOLUS
1000.0000 mL | Freq: Once | INTRAVENOUS | Status: AC
  Administered 2022-08-11: 1000 mL via INTRAVENOUS

## 2022-08-11 MED ORDER — SODIUM CHLORIDE (PF) 0.9 % IJ SOLN
INTRAMUSCULAR | Status: AC
  Filled 2022-08-11: qty 50

## 2022-08-11 MED ORDER — IOHEXOL 350 MG/ML SOLN
75.0000 mL | Freq: Once | INTRAVENOUS | Status: AC | PRN
  Administered 2022-08-11: 75 mL via INTRAVENOUS

## 2022-08-11 NOTE — ED Provider Notes (Signed)
[place] EMERGENCY DEPARTMENT AT [facility] Provider Note   CSN: [id number] Arrival date & time: 08/10/22  1816     History  Chief Complaint  Patient presents with   Abnormal Lab    Tonya Andrews is a 47 y.o. female.  47 year old female presents with elevated d-dimer at PCP office today where she was seen for fatigue 2 weeks post COVID.  Patient's heart rate in the office was 120, they continue to monitor her heart rate throughout her visit and it remained persistently elevated.  No complaints of palpitations, chest pain, shortness of breath.  No history of PE or DVT.  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  Also history of migraines, pneumonia and fibromyalgia.  Reports headache.  No other complaints or concerns tonight.       Home Medications Prior to Admission medications   Medication Sig Start Date End Date Taking? Authorizing Provider  albuterol (VENTOLIN HFA) 108 (90 Base) MCG/ACT inhaler Inhale 1-2 puffs into the lungs every 6 (six) hours as needed for wheezing or shortness of breath. 12/20/20  Yes Tanda Rockers A, DO  cetirizine (ZYRTEC) 10 MG tablet Take 10 mg by mouth every evening.    Yes [provider]  doxycycline (VIBRA-TABS) 100 MG tablet Take 100 mg by mouth 2 (two) times daily. 08/10/22  Yes [provider]  DULoxetine (CYMBALTA) 30 MG capsule Take 30 mg by mouth 2 (two) times daily. 04/08/20  Yes [provider]  Multiple Vitamins-Minerals (MULTIVITAMIN WITH MINERALS) tablet Take 1 tablet by mouth daily.   Yes [provider]  Probiotic Product (PROBIOTIC PO) Take 1 tablet by mouth daily.   Yes [provider]  zonisamide (ZONEGRAN) 25 MG capsule Take 100 mg by mouth every evening. 07/28/22  Yes [provider]  fluticasone (FLONASE) 50 MCG/ACT nasal spray Place 2 sprays into both nostrils daily for 7 days. 12/20/20 07/07/21  Sloan Leiter, DO      Allergies    Amoxicillin, Nifedipine, Rho  d immune globulin, and Latex    Review of Systems   Review of Systems Negative except as per HPI Physical Exam Updated Vital Signs BP 115/81   Pulse 85   Temp 97.8 F (36.6 C) (Oral)   Resp 17   Ht 5\' 5"  (1.651 m)   Wt 89.4 kg   SpO2 99%   BMI 32.80 kg/m  Physical Exam Vitals and nursing note reviewed.  Constitutional:      General: She is not in acute distress.    Appearance: She is well-developed. She is not diaphoretic.  HENT:     Head: Normocephalic and atraumatic.  Cardiovascular:     Rate and Rhythm: Normal rate and regular rhythm.     Pulses: Normal pulses.     Heart sounds: Normal heart sounds.  Pulmonary:     Effort: Pulmonary effort is normal.     Breath sounds: Normal breath sounds.  Musculoskeletal:     Right lower leg: No edema.     Left lower leg: No edema.  Skin:    General: Skin is warm and dry.  Neurological:     Mental Status: She is alert and oriented to person, place, and time.  Psychiatric:        Behavior: Behavior normal.     ED Results / Procedures / Treatments   Labs (all labs ordered are listed, but only abnormal results are displayed) Labs Reviewed  COMPREHENSIVE METABOLIC  PANEL - Abnormal; Notable for the following components:      Result Value   Glucose, Bld 106 (*)    All other components within normal limits  URINALYSIS, ROUTINE W REFLEX MICROSCOPIC - Abnormal; Notable for the following components:   Hgb urine dipstick LARGE (*)    All other components within normal limits  D-DIMER, QUANTITATIVE - Abnormal; Notable for the following components:   D-Dimer, Quant 0.58 (*)    All other components within normal limits  CBC WITH DIFFERENTIAL/PLATELET    EKG None  Radiology CT Angio Chest PE W/Cm &/Or Wo Cm  Result Date: 08/11/2022 CLINICAL DATA:  47 year old female with recent COVID-19. Abnormal D-dimer. Weakness. EXAM: CT ANGIOGRAPHY CHEST WITH CONTRAST TECHNIQUE: Multidetector CT imaging of the chest was performed using the  standard protocol during bolus administration of intravenous contrast. Multiplanar CT image reconstructions and MIPs were obtained to evaluate the vascular anatomy. RADIATION DOSE REDUCTION: This exam was performed according to the departmental dose-optimization program which includes automated exposure control, adjustment of the mA and/or kV according to patient size and/or use of iterative reconstruction technique. CONTRAST:  75mL OMNIPAQUE IOHEXOL 350 MG/ML SOLN COMPARISON:  Chest radiographs 08/10/2022.  Chest CTA 04/24/2009. FINDINGS: Cardiovascular: Good contrast bolus timing in the pulmonary arterial tree. No pulmonary artery filling defect identified. Cardiac interatrial occluder device in place. No cardiomegaly or pericardial effusion. Negative visible aorta. Unremarkable proximal great vessels. Mediastinum/Nodes: Negative. No mediastinal mass or lymphadenopathy. Lungs/Pleura: Major airways are patent and low volumes are similar to the 2011 CTA. No consolidation or pleural effusion. No confluent pulmonary ground-glass opacity or convincing active pulmonary inflammation. Minor atelectasis. Upper Abdomen: Surgically absent gallbladder. Negative visible liver, spleen, pancreas, adrenal glands, kidneys, and bowel in the upper abdomen. Musculoskeletal: No acute osseous abnormality identified. Review of the MIP images confirms the above findings. IMPRESSION: 1. Negative for acute pulmonary embolus. 2. Minor atelectasis with no other acute or inflammatory process identified in the Chest. 3. A cardiac ASD closure device is in place. Electronically Signed   By: Odessa Fleming M.D.   On: 08/11/2022 04:18   DG Chest 2 View  Result Date: 08/10/2022 CLINICAL DATA:  Weakness EXAM: CHEST - 2 VIEW COMPARISON:  05/03/2021 FINDINGS: The heart size and mediastinal contours are within normal limits. Both lungs are clear. The visualized skeletal structures are unremarkable. IMPRESSION: No active cardiopulmonary disease.  Electronically Signed   By: Charlett Nose M.D.   On: 08/10/2022 23:25   CT Head Wo Contrast  Result Date: 08/10/2022 CLINICAL DATA:  History of remote CVA elevated D-dimer general fatigue EXAM: CT HEAD WITHOUT CONTRAST TECHNIQUE: Contiguous axial images were obtained from the base of the skull through the vertex without intravenous contrast. RADIATION DOSE REDUCTION: This exam was performed according to the departmental dose-optimization program which includes automated exposure control, adjustment of the mA and/or kV according to patient size and/or use of iterative reconstruction technique. COMPARISON:  None Available. FINDINGS: Brain: No acute territorial infarction, hemorrhage or intracranial mass. The ventricles are nonenlarged Vascular: No hyperdense vessels.  No unexpected calcification Skull: Normal. Negative for fracture or focal lesion. Sinuses/Orbits: Moderate mucosal thickening in the sphenoid sinuses Other: None IMPRESSION: 1. Negative non contrasted CT appearance of the brain. 2. Sphenoid sinus disease. Electronically Signed   By: Jasmine Pang M.D.   On: 08/10/2022 19:44    Procedures Procedures    Medications Ordered in ED Medications  sodium chloride 0.9 % bolus 1,000 mL (0 mLs Intravenous Stopped 08/11/22 0403)  iohexol (OMNIPAQUE) 350 MG/ML injection 75 mL (75 mLs Intravenous Contrast Given 08/11/22 0354)    ED Course/ Medical Decision Making/ A&P                             Medical Decision Making Amount and/or Complexity of Data Reviewed Labs: ordered. Radiology: ordered.  Risk Prescription drug management.   This patient presents to the ED for concern of fatigue, post covid, elevated d-dimer, this involves an extensive number of treatment options, and is a complaint that carries with it a high risk of complications and morbidity.  The differential diagnosis includes but not limited to PE, post viral syndrome, PNA   Co morbidities that complicate the patient  evaluation  PFO, corrected.  CVA.  Migraines, gallstones, fibromyalgia.   Additional history obtained:  Additional history obtained from spouse at bedside who contributes to history as above External records from outside source obtained and reviewed including unable to locate results from labs obtained outpatient prior to arrival today.   Lab Tests:  I Ordered, and personally interpreted labs.  The pertinent results include: CBC within normal limits.  D-dimer is elevated at 0.58.  CMP without segment findings.  Urinalysis with large hemoglobin however patient is on her menstrual cycle.   Imaging Studies ordered:  I ordered imaging studies including CT head, chest x-ray, CT PE study I independently visualized and interpreted imaging which showed no acute findings, negative for PE I agree with the radiologist interpretation   Cardiac Monitoring: / EKG:  The patient was maintained on a cardiac monitor.  I personally viewed and interpreted the cardiac monitored which showed an underlying rhythm of: Sinus rhythm, rate 82  Problem List / ED Course / Critical interventions / Medication management  47 year old female with concern for elevated D-dimer in the setting of tachycardia, post COVID.  Workup today is reassuring.  Negative head CT in regards to her headache, PE study negative in regards to the elevated D-dimer.  There is no other explanation for her fatigue today, there is no significant electrolyte disturbance, she is not anemic.  No evidence of pneumonia.  Recommend patient follow-up with her PCP if symptoms persist.  Return to ED as needed. I ordered medication including IV fluids for headache Reevaluation of the patient after these medicines showed that the patient improved I have reviewed the patients home medicines and have made adjustments as needed   Social Determinants of Health:  Lives with family, has PCP   Test / Admission - Considered:  Stable for discharge with  plan to recheck with PCP         Final Clinical Impression(s) / ED Diagnoses Final diagnoses:  Other fatigue    Rx / DC Orders ED Discharge Orders     None         Jeannie Fend, PA-C 08/11/22 0428    Gilda Crease, MD 08/11/22 [phone]

## 2022-08-18 DIAGNOSIS — G43719 Chronic migraine without aura, intractable, without status migrainosus: Secondary | ICD-10-CM | POA: Diagnosis not present

## 2022-09-29 DIAGNOSIS — G43719 Chronic migraine without aura, intractable, without status migrainosus: Secondary | ICD-10-CM | POA: Diagnosis not present

## 2022-10-22 DIAGNOSIS — G43719 Chronic migraine without aura, intractable, without status migrainosus: Secondary | ICD-10-CM | POA: Diagnosis not present

## 2022-11-02 DIAGNOSIS — K219 Gastro-esophageal reflux disease without esophagitis: Secondary | ICD-10-CM | POA: Diagnosis not present

## 2022-11-02 DIAGNOSIS — R140 Abdominal distension (gaseous): Secondary | ICD-10-CM | POA: Diagnosis not present

## 2022-11-23 DIAGNOSIS — E538 Deficiency of other specified B group vitamins: Secondary | ICD-10-CM | POA: Diagnosis not present

## 2022-11-23 DIAGNOSIS — R000 Tachycardia, unspecified: Secondary | ICD-10-CM | POA: Diagnosis not present

## 2022-11-23 DIAGNOSIS — Z23 Encounter for immunization: Secondary | ICD-10-CM | POA: Diagnosis not present

## 2022-11-23 DIAGNOSIS — R252 Cramp and spasm: Secondary | ICD-10-CM | POA: Diagnosis not present

## 2022-11-23 DIAGNOSIS — E669 Obesity, unspecified: Secondary | ICD-10-CM | POA: Diagnosis not present

## 2022-12-01 DIAGNOSIS — Z1389 Encounter for screening for other disorder: Secondary | ICD-10-CM | POA: Diagnosis not present

## 2022-12-01 DIAGNOSIS — E8889 Other specified metabolic disorders: Secondary | ICD-10-CM | POA: Diagnosis not present

## 2022-12-01 DIAGNOSIS — G43109 Migraine with aura, not intractable, without status migrainosus: Secondary | ICD-10-CM | POA: Diagnosis not present

## 2022-12-01 DIAGNOSIS — E538 Deficiency of other specified B group vitamins: Secondary | ICD-10-CM | POA: Diagnosis not present

## 2022-12-11 ENCOUNTER — Ambulatory Visit: Payer: BC Managed Care – PPO | Attending: Nurse Practitioner

## 2022-12-11 ENCOUNTER — Ambulatory Visit: Payer: BC Managed Care – PPO | Attending: Nurse Practitioner | Admitting: Nurse Practitioner

## 2022-12-11 ENCOUNTER — Encounter: Payer: Self-pay | Admitting: Nurse Practitioner

## 2022-12-11 VITALS — BP 106/80 | HR 103 | Ht 65.0 in | Wt 211.6 lb

## 2022-12-11 DIAGNOSIS — G90A Postural orthostatic tachycardia syndrome (POTS): Secondary | ICD-10-CM | POA: Diagnosis not present

## 2022-12-11 DIAGNOSIS — R000 Tachycardia, unspecified: Secondary | ICD-10-CM

## 2022-12-11 DIAGNOSIS — Q211 Atrial septal defect, unspecified: Secondary | ICD-10-CM | POA: Diagnosis not present

## 2022-12-11 DIAGNOSIS — R5382 Chronic fatigue, unspecified: Secondary | ICD-10-CM

## 2022-12-11 DIAGNOSIS — M797 Fibromyalgia: Secondary | ICD-10-CM

## 2022-12-11 DIAGNOSIS — Z8673 Personal history of transient ischemic attack (TIA), and cerebral infarction without residual deficits: Secondary | ICD-10-CM | POA: Diagnosis not present

## 2022-12-11 NOTE — Progress Notes (Unsigned)
Enrolled patient for a 14 day Zio XT monitor to be mailed to patients home  B. Christopher to read

## 2022-12-11 NOTE — Patient Instructions (Addendum)
Medication Instructions:  Your physician recommends that you continue on your current medications as directed. Please refer to the Current Medication list given to you today.  *If you need a refill on your cardiac medications before your next appointment, please call your pharmacy*   Lab Work: NONE ordered at this time of appointment    Testing/Procedures: Your physician has requested that you have an echocardiogram. Echocardiography is a painless test that uses sound waves to create images of your heart. It provides your doctor with information about the size and shape of your heart and how well your heart's chambers and valves are working. This procedure takes approximately one hour. There are no restrictions for this procedure. Please do NOT wear cologne, perfume, aftershave, or lotions (deodorant is allowed). Please arrive 15 minutes prior to your appointment time.  Please note: We ask at that you not bring children with you during ultrasound (echo/ vascular) testing. Due to room size and safety concerns, children are not allowed in the ultrasound rooms during exams. Our front office staff cannot provide observation of children in our lobby area while testing is being conducted. An adult accompanying a patient to their appointment will only be allowed in the ultrasound room at the discretion of the ultrasound technician under special circumstances. We apologize for any inconvenience.   ZIO XT- Long Term Monitor Instructions  Your physician has requested you wear a ZIO patch monitor for 14 days.  This is a single patch monitor. [employer] supplies one patch monitor per enrollment. Additional stickers are not available. Please do not apply patch if you will be having a Nuclear Stress Test,  Echocardiogram, Cardiac CT, MRI, or Chest Xray during the period you would be wearing the  monitor. The patch cannot be worn during these tests. You cannot remove and re-apply the  ZIO XT patch monitor.   Your ZIO patch monitor will be mailed 3 day USPS to your address on file. It may take 3-5 days  to receive your monitor after you have been enrolled.  Once you have received your monitor, please review the enclosed instructions. Your monitor  has already been registered assigning a specific monitor serial # to you.  Billing and Patient Assistance Program Information  We have supplied [employer] with any of your insurance information on file for billing purposes. [employer] offers a sliding scale Patient Assistance Program for patients that do not have  insurance, or whose insurance does not completely cover the cost of the ZIO monitor.  You must apply for the Patient Assistance Program to qualify for this discounted rate.  To apply, please call [employer] at [phone], select option 4, select option 2, ask to apply for  Patient Assistance Program. Meredeth Ide will ask your household income, and how many people  are in your household. They will quote your out-of-pocket cost based on that information.  [employer] will also be able to set up a 36-month, interest-free payment plan if needed.  Applying the monitor   Shave hair from upper left chest.  Hold abrader disc by orange tab. Rub abrader in 40 strokes over the upper left chest as  indicated in your monitor instructions.  Clean area with 4 enclosed alcohol pads. Let dry.  Apply patch as indicated in monitor instructions. Patch will be placed under collarbone on left  side of chest with arrow pointing upward.  Rub patch adhesive wings for 2 minutes. Remove white label marked "1". Remove the white  label marked "2". Rub patch adhesive wings  for 2 additional minutes.  While looking in a mirror, press and release button in center of patch. A small green light will  flash 3-4 times. This will be your only indicator that the monitor has been turned on.  Do not shower for the first 24 hours. You may shower after the first 24 hours.  Press the button if  you feel a symptom. You will hear a small click. Record Date, Time and  Symptom in the Patient Logbook.  When you are ready to remove the patch, follow instructions on the last 2 pages of Patient  Logbook. Stick patch monitor onto the last page of Patient Logbook.  Place Patient Logbook in the blue and white box. Use locking tab on box and tape box closed  securely. The blue and white box has prepaid postage on it. Please place it in the mailbox as  soon as possible. Your physician should have your test results approximately 7 days after the  monitor has been mailed back to [facility].  Call [facility] Customer Care at [phone] if you have questions regarding  your ZIO XT patch monitor. Call them immediately if you see an orange light blinking on your  monitor.  If your monitor falls off in less than 4 days, contact our Monitor department at [phone].  If your monitor becomes loose or falls off after 4 days call [employer] at [phone] for  suggestions on securing your monitor    Follow-Up: At [facility], you and your health needs are our priority.  As part of our continuing mission to provide you with exceptional heart care, we have created designated Provider Care Teams.  These Care Teams include your primary Cardiologist (physician) and Advanced Practice Providers (APPs -  Physician Assistants and Nurse Practitioners) who all work together to provide you with the care you need, when you need it.  We recommend signing up for the patient portal called "MyChart".  Sign up information is provided on this After Visit Summary.  MyChart is used to connect with patients for Virtual Visits (Telemedicine).  Patients are able to view lab/test results, encounter notes, upcoming appointments, etc.  Non-urgent messages can be sent to your provider as well.   To learn more about what you can do with MyChart, go to [email or web address].    Your next appointment:   6  week(s) Next Available (Dr. Cristal Deer) Provider:   Bernadene Person, NP

## 2022-12-11 NOTE — Progress Notes (Signed)
Office Visit    Patient Name: Tonya Andrews Date of Encounter: 12/11/2022  Primary Care Provider:  Mila Palmer, MD Primary Cardiologist:  Jodelle Red, MD  Chief Complaint    47 year old female with a history of ASD s/p transcatheter closure in 2011, TIA, POTS, chronic fatigue, fibromyalgia, and hypothyroidism who presents for follow-up related to ASD and POTS.  Past Medical History    Past Medical History:  Diagnosis Date   Fibromyalgia    Gallstones    Migraine headache    Dr Neale Burly   Nasal polyp    Nerve pain    PFO (patent foramen ovale) 04/2009   by bubble study/TEE, Dr Excell Seltzer, cardiology   Pneumonia 2008   PONV (postoperative nausea and vomiting)    Seasonal allergies    Stroke [facility]) 04/2009, 07/2009   Dr Pearlean Brownie   Past Surgical History:  Procedure Laterality Date   CHOLECYSTECTOMY N/A 05/30/2019   Procedure: LAPAROSCOPIC CHOLECYSTECTOMY;  Surgeon: Gaynelle Adu, MD;  Location: [facility];  Service: General;  Laterality: N/A;   KNEE SURGERY Right    x2   PATENT FORAMEN OVALE(PFO) CLOSURE  09/2009   transcath closure    WISDOM TOOTH EXTRACTION      Allergies  Allergies  Allergen Reactions   Amoxicillin Nausea And Vomiting    Does not ever want again. Made her feel bad.     Nifedipine Other (See Comments)   Rho D Immune Globulin Other (See Comments)   Latex Other (See Comments)    Unknown to patient, "happened during surgery"     Labs/Other Studies Reviewed    The following studies were reviewed today:  Cardiac Studies & Procedures       ECHOCARDIOGRAM  ECHOCARDIOGRAM COMPLETE 05/31/2020  Narrative ECHOCARDIOGRAM REPORT    Patient Name:   Tonya Andrews Date of Exam: 05/31/2020 Medical Rec #:  [id number]     Height:       66.0 in Accession #:    [id number]    Weight:       155.6 lb Date of Birth:  1975-07-03      BSA:          1.797 m Patient Age:    44 years      BP:           118/88 mmHg Patient Gender: F             HR:            66 bpm. Exam Location:  [address]  Procedure: 2D Echo, Cardiac Doppler, Color Doppler and 3D Echo  Indications:    Q21.1 ASD  History:        Patient has prior history of Echocardiogram examinations, most recent 11/23/2011. TIA and POTS; ASD (amplatzer device).  Sonographer:    Samule Ohm RDCS Referring Phys: [id number] HEATHER E [place]  IMPRESSIONS   1. Left ventricular ejection fraction, by estimation, is 50 to 55%. Left ventricular ejection fraction by 3D volume is 50 %. The left ventricle has low normal function. The left ventricle has no regional wall motion abnormalities. Left ventricular diastolic parameters were normal. 2. Right ventricular systolic function is normal. The right ventricular size is normal. 3. Well seated Amplatzer septal occluder without residual shunt. 4. The mitral valve is normal in structure. Trivial mitral valve regurgitation. No evidence of mitral stenosis. 5. The aortic valve is normal in structure. Aortic valve regurgitation is not visualized. No aortic stenosis is present. 6. The inferior vena  cava is normal in size with greater than 50% respiratory variability, suggesting right atrial pressure of 3 mmHg.  Comparison(s): Prior images unable to be directly viewed, comparison made by report only.  FINDINGS Left Ventricle: Left ventricular ejection fraction, by estimation, is 50 to 55%. Left ventricular ejection fraction by 3D volume is 50 %. The left ventricle has low normal function. The left ventricle has no regional wall motion abnormalities. The left ventricular internal cavity size was normal in size. There is no left ventricular hypertrophy. Left ventricular diastolic parameters were normal. Normal left ventricular filling pressure.  Right Ventricle: The right ventricular size is normal. No increase in right ventricular wall thickness. Right ventricular systolic function is normal.  Left Atrium: Left atrial size was normal in  size.  Right Atrium: Right atrial size was normal in size.  Pericardium: There is no evidence of pericardial effusion.  Mitral Valve: The mitral valve is normal in structure. Trivial mitral valve regurgitation. No evidence of mitral valve stenosis.  Tricuspid Valve: The tricuspid valve is normal in structure. Tricuspid valve regurgitation is trivial. No evidence of tricuspid stenosis.  Aortic Valve: The aortic valve is normal in structure. Aortic valve regurgitation is not visualized. No aortic stenosis is present.  Pulmonic Valve: The pulmonic valve was normal in structure. Pulmonic valve regurgitation is not visualized. No evidence of pulmonic stenosis.  Aorta: The aortic root is normal in size and structure.  Venous: The inferior vena cava is normal in size with greater than 50% respiratory variability, suggesting right atrial pressure of 3 mmHg.  IAS/Shunts: No atrial level shunt detected by color flow Doppler.   LEFT VENTRICLE PLAX 2D LVIDd:         5.20 cm         Diastology LVIDs:         3.70 cm         LV e' lateral:   12.00 cm/s LV PW:         0.90 cm         LV E/e' lateral: 7.6 LV IVS:        0.80 cm LVOT diam:     2.00 cm LV SV:         55              3D Volume EF LV SV Index:   31              LV 3D EF:    Left LVOT Area:     3.14 cm                     ventricular ejection fraction by 3D volume is 50 %.  3D Volume EF: 3D EF:        50 % LV EDV:       161 ml LV ESV:       80 ml LV SV:        81 ml  RIGHT VENTRICLE             IVC RV S prime:     11.45 cm/s  IVC diam: 1.50 cm TAPSE (M-mode): 2.2 cm RVSP:           15.2 mmHg  LEFT ATRIUM             Index       RIGHT ATRIUM           Index LA diam:  3.30 cm 1.84 cm/m  RA Pressure: 3.00 mmHg LA Vol (A2C):   45.8 ml 25.48 ml/m RA Area:     12.80 cm LA Vol (A4C):   33.4 ml 18.58 ml/m RA Volume:   30.80 ml  17.14 ml/m LA Biplane Vol: 40.3 ml 22.42 ml/m AORTIC VALVE LVOT Vmax:   81.40  cm/s LVOT Vmean:  56.700 cm/s LVOT VTI:    0.175 m  AORTA Ao Root diam: 3.10 cm Ao Asc diam:  3.20 cm  MV E velocity: 91.30 cm/s  TRICUSPID VALVE MV A velocity: 61.60 cm/s  TR Peak grad:   12.2 mmHg MV E/A ratio:  1.48        TR Vmax:        175.00 cm/s Estimated RAP:  3.00 mmHg RVSP:           15.2 mmHg  SHUNTS Systemic VTI:  0.18 m Systemic Diam: 2.00 cm  Thurmon Fair MD Electronically signed by Thurmon Fair MD Signature Date/Time: 05/31/2020/1:28:16 PM    Final            Recent Labs: 08/11/2022: ALT 41; BUN 10; Creatinine, Ser 0.78; Hemoglobin 13.5; Platelets 312; Potassium 3.9; Sodium 137  Recent Lipid Panel    Component Value Date/Time   CHOL  08/12/2009 0439    138        ATP III CLASSIFICATION:  <200     mg/dL   Desirable  413-244  mg/dL   Borderline High  >=010    mg/dL   High          TRIG 38 08/12/2009 0439   HDL 50 08/12/2009 0439   CHOLHDL 2.8 08/12/2009 0439   VLDL 8 08/12/2009 0439   LDLCALC  08/12/2009 0439    80        Total Cholesterol/HDL:CHD Risk Coronary Heart Disease Risk Table                     Men   Women  1/2 Average Risk   3.4   3.3  Average Risk       5.0   4.4  2 X Average Risk   9.6   7.1  3 X Average Risk  23.4   11.0        Use the calculated Patient Ratio above and the CHD Risk Table to determine the patient's CHD Risk.        ATP III CLASSIFICATION (LDL):  <100     mg/dL   Optimal  272-536  mg/dL   Near or Above                    Optimal  130-159  mg/dL   Borderline  644-034  mg/dL   High  >742     mg/dL   Very High    History of Present Illness    47 year old female with the above past medical history including ASD  s/p transcatheter closure in 2011, TIA, POTS, chronic fatigue, fibromyalgia, and hypothyroidism.  Previously followed by Dr. Excell Seltzer and subsequently Dr. Shari Prows.  She has a  history of TIA found to have ASD s/p closure with Amplatzer device in 2011.  Additionally, she has a history of POTS,  chronic fatigue syndrome, and fibromyalgia.  This is managed by a specialist in [place].  She was last seen in the office on 04/09/2020 stable from a cardiac standpoint. Repeat echocardiogram 05/2020 showed EF 50 to 55%, normal LV function, no RWMA, normal  RV, well-seated Amplatzer septal occluder without residual shunt, no significant valvular abnormalities.  He was evaluated in the ED in July 2024 in the setting of elevated D-dimer, tachycardia post COVID-19 infection.  CT of the head was negative, PE study was negative. She was discharged home in stable condition advised to follow-up with her PCP.  She presents today for follow-up.  Since her last visit she has been stable from a cardiac standpoint. Over the past several weeks she has noted elevated heart rate both at rest and with activity. Her HR has reached the 140s bpm consistently with minimal activity with associated lightheadedness, shortness of breath, and chest tightness.  She denies presyncope or syncope. She also notes progressive generalized fatigue.  She denies exertional symptoms concerning for angina. She is no longer following with a specialist for POTS as her insurance did not cover this.  She would like to establish with Dr. Cristal Deer as her primary cardiologist going forward.  Home Medications    Current Outpatient Medications  Medication Sig Dispense Refill   albuterol (VENTOLIN HFA) 108 (90 Base) MCG/ACT inhaler Inhale 1-2 puffs into the lungs every 6 (six) hours as needed for wheezing or shortness of breath. 8.5 g 0   cetirizine (ZYRTEC) 10 MG tablet Take 10 mg by mouth every evening.      cyanocobalamin (VITAMIN B12) 1000 MCG tablet Take 1,500 mcg by mouth daily.     doxycycline (VIBRA-TABS) 100 MG tablet Take 100 mg by mouth 2 (two) times daily.     DULoxetine (CYMBALTA) 30 MG capsule Take 30 mg by mouth 2 (two) times daily.     EMGALITY 120 MG/ML SOAJ 120 mg every 30 (thirty) days.     ibuprofen (ADVIL) 200 MG tablet Take  200 mg by mouth daily.     Multiple Vitamins-Minerals (MULTIVITAMIN WITH MINERALS) tablet Take 1 tablet by mouth daily.     Probiotic Product (PROBIOTIC PO) Take 1 tablet by mouth daily.     zonisamide (ZONEGRAN) 25 MG capsule Take 100 mg by mouth every evening.     fluticasone (FLONASE) 50 MCG/ACT nasal spray Place 2 sprays into both nostrils daily for 7 days. 16 g 2   No current facility-administered medications for this visit.     Review of Systems    She denies pnd, orthopnea, n, v, syncope, edema, weight gain, or early satiety. All other systems reviewed and are otherwise negative except as noted above.   Physical Exam    VS:  BP 106/80 (BP Location: Left Arm, Patient Position: Sitting, Cuff Size: Large)   Pulse (!) 103   Ht 5\' 5"  (1.651 m)   Wt 211 lb 9.6 oz (96 kg)   SpO2 98%   BMI 35.21 kg/m   GEN: Well nourished, well developed, in no acute distress. HEENT: normal. Neck: Supple, no JVD, carotid bruits, or masses. Cardiac: RRR, no murmurs, rubs, or gallops. No clubbing, cyanosis, edema.  Radials/DP/PT 2+ and equal bilaterally.  Respiratory:  Respirations regular and unlabored, clear to auscultation bilaterally. GI: Soft, nontender, nondistended, BS + x 4. MS: no deformity or atrophy. Skin: warm and dry, no rash. Neuro:  Strength and sensation are intact. Psych: Normal affect.  Accessory Clinical Findings    ECG personally reviewed by me today - EKG Interpretation Date/Time:  Friday December 11 2022 08:25:04 EST Ventricular Rate:  90 PR Interval:  132 QRS Duration:  70 QT Interval:  368 QTC Calculation: 450 R Axis:   0  Text Interpretation: Normal  sinus rhythm Nonspecific T wave abnormality No significant change since last tracing Confirmed by Bernadene Person ([id number]) on 12/11/2022 8:27:14 AM     Lab Results  Component Value Date   WBC 8.8 08/11/2022   HGB 13.5 08/11/2022   HCT 42.0 08/11/2022   MCV 94.4 08/11/2022   PLT 312 08/11/2022   Lab Results  Component  Value Date   CREATININE 0.78 08/11/2022   BUN 10 08/11/2022   NA 137 08/11/2022   K 3.9 08/11/2022   CL 105 08/11/2022   CO2 25 08/11/2022   Lab Results  Component Value Date   ALT 41 08/11/2022   AST 34 08/11/2022   ALKPHOS 60 08/11/2022   BILITOT 0.3 08/11/2022   Lab Results  Component Value Date   CHOL  08/12/2009    138        ATP III CLASSIFICATION:  <200     mg/dL   Desirable  578-469  mg/dL   Borderline High  >=629    mg/dL   High          HDL 50 08/12/2009   LDLCALC  08/12/2009    80        Total Cholesterol/HDL:CHD Risk Coronary Heart Disease Risk Table                     Men   Women  1/2 Average Risk   3.4   3.3  Average Risk       5.0   4.4  2 X Average Risk   9.6   7.1  3 X Average Risk  23.4   11.0        Use the calculated Patient Ratio above and the CHD Risk Table to determine the patient's CHD Risk.        ATP III CLASSIFICATION (LDL):  <100     mg/dL   Optimal  528-413  mg/dL   Near or Above                    Optimal  130-159  mg/dL   Borderline  244-010  mg/dL   High  >272     mg/dL   Very High   TRIG 38 53/66/4403   CHOLHDL 2.8 08/12/2009    Lab Results  Component Value Date   HGBA1C  08/11/2009    5.0 (NOTE)                                                                       According to the ADA Clinical Practice Recommendations for 2011, when HbA1c is used as a screening test:   >=6.5%   Diagnostic of Diabetes Mellitus           (if abnormal result  is confirmed)  5.7-6.4%   Increased risk of developing Diabetes Mellitus  References:Diagnosis and Classification of Diabetes Mellitus,Diabetes Care,2011,34(Suppl 1):S62-S69 and Standards of Medical Care in         Diabetes - 2011,Diabetes Care,2011,34  (Suppl 1):S11-S61.    Assessment & Plan    1. ASD/history of TIA: S/p TIA in the setting of ASD.  S/p closure with Amplatzer device in 2011.  Most recent echo in 05/2020 showed EF 50 to 55%,  normal LV function, no RWMA, normal RV,  well-seated Amplatzer septal occluder without residual shunt, no significant valvular abnormalities.  Will repeat echo for routine monitoring.   2. POTS/chronic fatigue/fibromyalgia/tachycardia: Previously followed with a specialist in [place], unfortunately, her insurance did not cover this.  She has chronic lightheadedness, palpitations, chronic fatigue and myalgias.  More recently, she has noted an elevated heart rate both at rest and with activity.  She has associated lightheadedness, shortness of breath, and chest discomfort.  She denies any exertional chest pain.  She reports progressive fatigue.  We discussed possible initiation of beta-blocker therapy, she declines at this time.  Suspect tachycardia is in the setting of POTS.  However, will check 14-day ZIO monitor to rule out any significant arrhythmia.  Repeat echo pending as above.  Continue liberal fluid and salt intake, compression therapy, activity as tolerated.   3. Disposition: Follow-up in 6 weeks with APP, next available with Dr. Cristal Deer.       Joylene Grapes, NP 12/11/2022, 8:56 AM

## 2022-12-14 DIAGNOSIS — J00 Acute nasopharyngitis [common cold]: Secondary | ICD-10-CM | POA: Diagnosis not present

## 2022-12-15 DIAGNOSIS — R000 Tachycardia, unspecified: Secondary | ICD-10-CM | POA: Diagnosis not present

## 2022-12-17 DIAGNOSIS — B9689 Other specified bacterial agents as the cause of diseases classified elsewhere: Secondary | ICD-10-CM | POA: Diagnosis not present

## 2022-12-17 DIAGNOSIS — J018 Other acute sinusitis: Secondary | ICD-10-CM | POA: Diagnosis not present

## 2022-12-17 DIAGNOSIS — B349 Viral infection, unspecified: Secondary | ICD-10-CM | POA: Diagnosis not present

## 2022-12-22 DIAGNOSIS — E66811 Obesity, class 1: Secondary | ICD-10-CM | POA: Diagnosis not present

## 2022-12-22 DIAGNOSIS — E538 Deficiency of other specified B group vitamins: Secondary | ICD-10-CM | POA: Diagnosis not present

## 2022-12-22 DIAGNOSIS — G43109 Migraine with aura, not intractable, without status migrainosus: Secondary | ICD-10-CM | POA: Diagnosis not present

## 2022-12-30 DIAGNOSIS — M170 Bilateral primary osteoarthritis of knee: Secondary | ICD-10-CM | POA: Diagnosis not present

## 2022-12-30 DIAGNOSIS — M1712 Unilateral primary osteoarthritis, left knee: Secondary | ICD-10-CM | POA: Diagnosis not present

## 2022-12-30 DIAGNOSIS — M1711 Unilateral primary osteoarthritis, right knee: Secondary | ICD-10-CM | POA: Diagnosis not present

## 2023-01-06 DIAGNOSIS — G43109 Migraine with aura, not intractable, without status migrainosus: Secondary | ICD-10-CM | POA: Diagnosis not present

## 2023-01-06 DIAGNOSIS — R197 Diarrhea, unspecified: Secondary | ICD-10-CM | POA: Diagnosis not present

## 2023-01-06 DIAGNOSIS — E538 Deficiency of other specified B group vitamins: Secondary | ICD-10-CM | POA: Diagnosis not present

## 2023-01-06 DIAGNOSIS — R140 Abdominal distension (gaseous): Secondary | ICD-10-CM | POA: Diagnosis not present

## 2023-01-06 DIAGNOSIS — K5900 Constipation, unspecified: Secondary | ICD-10-CM | POA: Diagnosis not present

## 2023-01-06 DIAGNOSIS — E66811 Obesity, class 1: Secondary | ICD-10-CM | POA: Diagnosis not present

## 2023-01-06 DIAGNOSIS — R1110 Vomiting, unspecified: Secondary | ICD-10-CM | POA: Diagnosis not present

## 2023-01-12 DIAGNOSIS — R000 Tachycardia, unspecified: Secondary | ICD-10-CM | POA: Diagnosis not present

## 2023-01-18 ENCOUNTER — Ambulatory Visit (HOSPITAL_COMMUNITY): Payer: BC Managed Care – PPO | Attending: Nurse Practitioner

## 2023-01-18 DIAGNOSIS — R000 Tachycardia, unspecified: Secondary | ICD-10-CM | POA: Insufficient documentation

## 2023-01-18 DIAGNOSIS — Q211 Atrial septal defect, unspecified: Secondary | ICD-10-CM | POA: Diagnosis not present

## 2023-01-18 LAB — ECHOCARDIOGRAM COMPLETE
Area-P 1/2: 5.32 cm2
S' Lateral: 3.75 cm

## 2023-01-22 ENCOUNTER — Ambulatory Visit: Payer: BC Managed Care – PPO | Attending: Nurse Practitioner | Admitting: Nurse Practitioner

## 2023-01-22 ENCOUNTER — Encounter: Payer: Self-pay | Admitting: Nurse Practitioner

## 2023-01-22 ENCOUNTER — Other Ambulatory Visit (HOSPITAL_COMMUNITY): Payer: Self-pay

## 2023-01-22 VITALS — BP 106/82 | HR 104 | Ht 65.0 in | Wt 206.0 lb

## 2023-01-22 DIAGNOSIS — M797 Fibromyalgia: Secondary | ICD-10-CM

## 2023-01-22 DIAGNOSIS — G90A Postural orthostatic tachycardia syndrome (POTS): Secondary | ICD-10-CM

## 2023-01-22 DIAGNOSIS — R5382 Chronic fatigue, unspecified: Secondary | ICD-10-CM

## 2023-01-22 DIAGNOSIS — Q211 Atrial septal defect, unspecified: Secondary | ICD-10-CM

## 2023-01-22 DIAGNOSIS — Z8673 Personal history of transient ischemic attack (TIA), and cerebral infarction without residual deficits: Secondary | ICD-10-CM | POA: Diagnosis not present

## 2023-01-22 DIAGNOSIS — R0602 Shortness of breath: Secondary | ICD-10-CM

## 2023-01-22 DIAGNOSIS — I429 Cardiomyopathy, unspecified: Secondary | ICD-10-CM

## 2023-01-22 DIAGNOSIS — R000 Tachycardia, unspecified: Secondary | ICD-10-CM

## 2023-01-22 DIAGNOSIS — R072 Precordial pain: Secondary | ICD-10-CM

## 2023-01-22 MED ORDER — IVABRADINE HCL 5 MG PO TABS
ORAL_TABLET | ORAL | 0 refills | Status: DC
  Filled 2023-01-22: qty 3, 1d supply, fill #0

## 2023-01-22 MED ORDER — METOPROLOL TARTRATE 25 MG PO TABS: 12.5000 mg | ORAL_TABLET | Freq: Two times a day (BID) | ORAL | 3 refills | Status: DC

## 2023-01-22 NOTE — Patient Instructions (Addendum)
Medication Instructions:  Start Metoprolol Tartrate 12.5 mg twice daily  Ivabradine 15 mg 2 hours prior to cardiac CT  *If you need a refill on your cardiac medications before your next appointment, please call your pharmacy*   Lab Work: BMET today   Testing/Procedures:   Your cardiac CT will be scheduled at one of the below locations:   [facility] [address] [place], [place] [address] [phone]   If scheduled at [facility], please arrive at the [facility] and Children's Entrance (Entrance C2) of [facility] 30 minutes prior to test start time. You can use the FREE valet parking offered at entrance C (encouraged to control the heart rate for the test)  Proceed to the [facility] Radiology Department (first floor) to check-in and test prep.  All radiology patients and guests should use entrance C2 at [facility], accessed from [facility], even though the hospital's physical address listed is [address].    If scheduled at [facility] or [facility], please arrive 15 mins early for check-in and test prep.  There is spacious parking and easy access to the radiology department from the [facility] Vascular entrance. Please enter here and check-in with the desk attendant.   Please follow these instructions carefully (unless otherwise directed):  An IV will be required for this test and Nitroglycerin will be given.  Hold all erectile dysfunction medications at least 3 days (72 hrs) prior to test. (Ie viagra, cialis, sildenafil, tadalafil, etc)   On the Night Before the Test: Be sure to Drink plenty of water. Do not consume any caffeinated/decaffeinated beverages or chocolate 12 hours prior to your test. Do not take any antihistamines 12 hours prior to your test. If the patient has contrast allergy: Patient will need a prescription for Prednisone and very clear  instructions (as follows): Prednisone 50 mg - take 13 hours prior to test Take another Prednisone 50 mg 7 hours prior to test Take another Prednisone 50 mg 1 hour prior to test Take Benadryl 50 mg 1 hour prior to test Patient must complete all four doses of above prophylactic medications. Patient will need a ride after test due to Benadryl.  On the Day of the Test: Drink plenty of water until 1 hour prior to the test. Do not eat any food 1 hour prior to test. You may take your regular medications prior to the test.  Take metoprolol (Lopressor) two hours prior to test. If you take Furosemide/Hydrochlorothiazide/Spironolactone/Chlorthalidone, please HOLD on the morning of the test. Patients who wear a continuous glucose monitor MUST remove the device prior to scanning. FEMALES- please wear underwire-free bra if available, avoid dresses & tight clothing  *For Clinical Staff only. Please instruct patient the following:* Heart Rate Medication Recommendations for Cardiac CT  Resting HR < 50 bpm  No medication  Resting HR 50-60 bpm and BP >110/50 mmHG   Consider Metoprolol tartrate 25 mg PO 90-120 min prior to scan  Resting HR 60-65 bpm and BP >110/50 mmHG  Metoprolol tartrate 50 mg PO 90-120 minutes prior to scan   Resting HR > 65 bpm and BP >110/50 mmHG  Metoprolol tartrate 100 mg PO 90-120 minutes prior to scan  Consider Ivabradine 10-15 mg PO or a calcium channel blocker for resting HR >60 bpm and contraindication to metoprolol tartrate  Consider Ivabradine 10-15 mg PO in combination with metoprolol tartrate for HR >80 bpm  After the Test: Drink plenty of water. After receiving IV contrast, you may experience a mild flushed feeling. This is normal. On occasion, you may experience a mild rash up to 24 hours after the test. This is not dangerous. If this occurs, you can take Benadryl 25 mg and increase your fluid intake. If you experience trouble breathing, this can be serious. If  it is severe call 911 IMMEDIATELY. If it is mild, please call our office.  We will call to schedule your test 2-4 weeks out understanding that some insurance companies will need an authorization prior to the service being performed.   For more information and frequently asked questions, please visit our website : [email or web address]  For non-scheduling related questions, please contact the cardiac imaging nurse navigator should you have any questions/concerns: Cardiac Imaging Nurse Navigators Direct Office Dial: [phone]   For scheduling needs, including cancellations and rescheduling, please call [place], [phone].    Follow-Up: At [facility], you and your health needs are our priority.  As part of our continuing mission to provide you with exceptional heart care, we have created designated Provider Care Teams.  These Care Teams include your primary Cardiologist (physician) and Advanced Practice Providers (APPs -  Physician Assistants and Nurse Practitioners) who all work together to provide you with the care you need, when you need it.  We recommend signing up for the patient portal called "MyChart".  Sign up information is provided on this After Visit Summary.  MyChart is used to connect with patients for Virtual Visits (Telemedicine).  Patients are able to view lab/test results, encounter notes, upcoming appointments, etc.  Non-urgent messages can be sent to your provider as well.   To learn more about what you can do with MyChart, go to [email or web address].    Your next appointment:    Keep follow up   Provider:   Jodelle Red, MD    Other Instructions

## 2023-01-22 NOTE — Progress Notes (Signed)
Office Visit    Patient Name: Tonya Andrews Date of Encounter: 01/22/2023  Primary Care Provider:  Mila Palmer, MD Primary Cardiologist:  Jodelle Red, MD  Chief Complaint    47 year old female with a history of ASD s/p transcatheter closure in 2011, TIA, POTS, chronic fatigue, fibromyalgia, and hypothyroidism who presents for follow-up related to tachycardia, cardiomyopathy.    Past Medical History    Past Medical History:  Diagnosis Date   Fibromyalgia    Gallstones    Migraine headache    Dr Neale Burly   Nasal polyp    Nerve pain    PFO (patent foramen ovale) 04/2009   by bubble study/TEE, Dr Excell Seltzer, cardiology   Pneumonia 2008   PONV (postoperative nausea and vomiting)    Seasonal allergies    Stroke [facility]) 04/2009, 07/2009   Dr Pearlean Brownie   Past Surgical History:  Procedure Laterality Date   CHOLECYSTECTOMY N/A 05/30/2019   Procedure: LAPAROSCOPIC CHOLECYSTECTOMY;  Surgeon: Gaynelle Adu, MD;  Location: [facility];  Service: General;  Laterality: N/A;   KNEE SURGERY Right    x2   PATENT FORAMEN OVALE(PFO) CLOSURE  09/2009   transcath closure    WISDOM TOOTH EXTRACTION      Allergies  Allergies  Allergen Reactions   Amoxicillin Nausea And Vomiting    Does not ever want again. Made her feel bad.     Nifedipine Other (See Comments)   Rho D Immune Globulin Other (See Comments)   Latex Other (See Comments)    Unknown to patient, "happened during surgery"     Labs/Other Studies Reviewed    The following studies were reviewed today:  Cardiac Studies & Procedures      ECHOCARDIOGRAM  ECHOCARDIOGRAM COMPLETE 01/18/2023  Narrative ECHOCARDIOGRAM REPORT    Patient Name:   Tonya Andrews Date of Exam: 01/18/2023 Medical Rec #:  [id number]     Height:       65.0 in Accession #:    [id number]    Weight:       211.6 lb Date of Birth:  [birth year]      BSA:          2.026 m Patient Age:    47 years      BP:           106/80 mmHg Patient Gender: F              HR:           84 bpm. Exam Location:  [address]  Procedure: 2D Echo, Cardiac Doppler and Color Doppler  Indications:    SVT (supraventricular tachycardia) R00.0  History:        Patient has prior history of Echocardiogram examinations, most recent 05/31/2020. Septal Repair:PFO Closure on 09/2009.  Sonographer:    Thurman Coyer RDCS Referring Phys: [phone] Jaasiel Hollyfield C Izacc Demeyer  IMPRESSIONS   1. Left ventricular ejection fraction, by estimation, is 45 to 50%. The left ventricle has mildly decreased function. The left ventricle demonstrates regional wall motion abnormalities. Septal hypokinesis. Left ventricular diastolic parameters are indeterminate. 2. Right ventricular systolic function is mildly reduced. The right ventricular size is normal. There is normal pulmonary artery systolic pressure. 3. S/p Amplatzer septal occluder device. No residual shunt seen. 4. The mitral valve is normal in structure. Trivial mitral valve regurgitation. No evidence of mitral stenosis. 5. The aortic valve is tricuspid. Aortic valve regurgitation is trivial. No aortic stenosis is present. 6. The inferior vena cava is normal in  size with greater than 50% respiratory variability, suggesting right atrial pressure of 3 mmHg.  FINDINGS Left Ventricle: Left ventricular ejection fraction, by estimation, is 45 to 50%. The left ventricle has mildly decreased function. The left ventricle demonstrates regional wall motion abnormalities. The left ventricular internal cavity size was normal in size. There is no left ventricular hypertrophy. Left ventricular diastolic parameters are indeterminate.  Right Ventricle: The right ventricular size is normal. No increase in right ventricular wall thickness. Right ventricular systolic function is mildly reduced. There is normal pulmonary artery systolic pressure. The tricuspid regurgitant velocity is 1.83 m/s, and with an assumed right atrial pressure of 3 mmHg, the estimated  right ventricular systolic pressure is 16.4 mmHg.  Left Atrium: Left atrial size was normal in size.  Right Atrium: Right atrial size was normal in size.  Pericardium: There is no evidence of pericardial effusion.  Mitral Valve: The mitral valve is normal in structure. Trivial mitral valve regurgitation. No evidence of mitral valve stenosis.  Tricuspid Valve: The tricuspid valve is normal in structure. Tricuspid valve regurgitation is trivial.  Aortic Valve: The aortic valve is tricuspid. Aortic valve regurgitation is trivial. No aortic stenosis is present.  Pulmonic Valve: The pulmonic valve was not well visualized. Pulmonic valve regurgitation is trivial.  Aorta: The aortic root and ascending aorta are structurally normal, with no evidence of dilitation.  Venous: The inferior vena cava is normal in size with greater than 50% respiratory variability, suggesting right atrial pressure of 3 mmHg.  IAS/Shunts: No atrial level shunt detected by color flow Doppler.   LEFT VENTRICLE PLAX 2D LVIDd:         4.65 cm   Diastology LVIDs:         3.75 cm   LV e' medial:    6.08 cm/s LV PW:         0.85 cm   LV E/e' medial:  17.1 LV IVS:        0.70 cm   LV e' lateral:   9.23 cm/s LVOT diam:     2.30 cm   LV E/e' lateral: 11.3 LV SV:         72 LV SV Index:   36 LVOT Area:     4.15 cm  3D Volume EF: 3D EF:        44 % LV EDV:       153 ml LV ESV:       86 ml LV SV:        68 ml  RIGHT VENTRICLE            IVC RV Basal diam:  3.60 cm    IVC diam: 1.50 cm RV Mid diam:    3.00 cm RV S prime:     8.85 cm/s TAPSE (M-mode): 1.4 cm  LEFT ATRIUM             Index        RIGHT ATRIUM           Index LA diam:        3.45 cm 1.70 cm/m   RA Area:     11.70 cm LA Vol (A2C):   50.3 ml 24.83 ml/m  RA Volume:   25.50 ml  12.59 ml/m LA Vol (A4C):   44.7 ml 22.06 ml/m LA Biplane Vol: 48.1 ml 23.74 ml/m AORTIC VALVE LVOT Vmax:   89.10 cm/s LVOT Vmean:  59.200 cm/s LVOT VTI:    0.174  m  AORTA  Ao Root diam: 2.80 cm Ao Asc diam:  3.50 cm  MITRAL VALVE                TRICUSPID VALVE MV Area (PHT): 5.32 cm     TR Peak grad:   13.4 mmHg MV Decel Time: 143 msec     TR Vmax:        183.00 cm/s MV E velocity: 104.00 cm/s MV A velocity: 82.55 cm/s   SHUNTS MV E/A ratio:  1.26         Systemic VTI:  0.17 m Systemic Diam: 2.30 cm  Epifanio Lesches MD Electronically signed by Epifanio Lesches MD Signature Date/Time: 01/18/2023/8:35:12 PM    Final            Recent Labs: 08/11/2022: ALT 41; BUN 10; Creatinine, Ser 0.78; Hemoglobin 13.5; Platelets 312; Potassium 3.9; Sodium 137  Recent Lipid Panel    Component Value Date/Time   CHOL  08/12/2009 0439    138        ATP III CLASSIFICATION:  <200     mg/dL   Desirable  161-096  mg/dL   Borderline High  >=045    mg/dL   High          TRIG 38 08/12/2009 0439   HDL 50 08/12/2009 0439   CHOLHDL 2.8 08/12/2009 0439   VLDL 8 08/12/2009 0439   LDLCALC  08/12/2009 0439    80        Total Cholesterol/HDL:CHD Risk Coronary Heart Disease Risk Table                     Men   Women  1/2 Average Risk   3.4   3.3  Average Risk       5.0   4.4  2 X Average Risk   9.6   7.1  3 X Average Risk  23.4   11.0        Use the calculated Patient Ratio above and the CHD Risk Table to determine the patient's CHD Risk.        ATP III CLASSIFICATION (LDL):  <100     mg/dL   Optimal  409-811  mg/dL   Near or Above                    Optimal  130-159  mg/dL   Borderline  914-782  mg/dL   High  >956     mg/dL   Very High    History of Present Illness    47 year old female with the above past medical history including ASD  s/p transcatheter closure in 2011, TIA, POTS, chronic fatigue, fibromyalgia, and hypothyroidism.   Previously followed by Dr. Excell Seltzer and subsequently Dr. Shari Prows.  She has a  history of TIA found to have ASD s/p closure with Amplatzer device in 2011.  Additionally, she has a history of POTS, chronic  fatigue syndrome, and fibromyalgia.  This is managed by a specialist in [place].  She was last seen in the office on 04/09/2020 stable from a cardiac standpoint. Repeat echocardiogram 05/2020 showed EF 50 to 55%, normal LV function, no RWMA, normal RV, well-seated Amplatzer septal occluder without residual shunt, no significant valvular abnormalities.  She was evaluated in the ED in July 2024 in the setting of elevated D-dimer, tachycardia post COVID-19 infection.  CT of the head was negative, PE study was negative. She was discharged home in stable condition advised to follow-up with her PCP.  She was last seen in the office on 12/11/2022 and noted a several week history of elevated heart rate, lightheadedness, shortness of breath, and chest tightness as well as progressive generalized fatigue.  Repeat echocardiogram showed EF 45 to 50%, mildly decreased LV function, RWMA, septal hypokinesis, mildly reduced RV systolic function, stable Amplatzer septal occluder device with no residual shunt, no other significant valvular abnormalities.  The echo monitor preliminary result showed average heart rate 10 1 bpm, minimum heart rate 64 bpm, max HR 173 bpm, predominantly sinus rhythm, isolated PACs and PVCs.   She presents today for follow-up.  Since her last visit she has been stable overall from a cardiac standpoint.  She continues to note elevated heart rate, occasional lightheadedness, occasional shortness of breath and chest discomfort.  Her biggest complaint  is her ongoing generalized fatigue.    Home Medications    Current Outpatient Medications  Medication Sig Dispense Refill   albuterol (VENTOLIN HFA) 108 (90 Base) MCG/ACT inhaler Inhale 1-2 puffs into the lungs every 6 (six) hours as needed for wheezing or shortness of breath. 8.5 g 0   cetirizine (ZYRTEC) 10 MG tablet Take 10 mg by mouth every evening.      cyanocobalamin (VITAMIN B12) 1000 MCG tablet Take 1,500 mcg by mouth daily.     doxycycline  (VIBRA-TABS) 100 MG tablet Take 100 mg by mouth 2 (two) times daily.     DULoxetine (CYMBALTA) 30 MG capsule Take 30 mg by mouth 2 (two) times daily.     EMGALITY 120 MG/ML SOAJ 120 mg every 30 (thirty) days.     ibuprofen (ADVIL) 200 MG tablet Take 200 mg by mouth daily.     ivabradine (CORLANOR) 5 MG TABS tablet Take 3 tablets 2 hours prior to your cardiac ct 3 tablet 0   metoprolol tartrate (LOPRESSOR) 25 MG tablet Take 0.5 tablets (12.5 mg total) by mouth 2 (two) times daily. 30 tablet 3   Multiple Vitamins-Minerals (MULTIVITAMIN WITH MINERALS) tablet Take 1 tablet by mouth daily.     Probiotic Product (PROBIOTIC PO) Take 1 tablet by mouth daily.     topiramate (TOPAMAX) 25 MG tablet Take 50 mg by mouth daily.     zonisamide (ZONEGRAN) 25 MG capsule Take 100 mg by mouth every evening.     fluticasone (FLONASE) 50 MCG/ACT nasal spray Place 2 sprays into both nostrils daily for 7 days. 16 g 2   No current facility-administered medications for this visit.     Review of Systems    She denies  pnd, orthopnea, n, v, dizziness, syncope, edema, weight gain, or early satiety. All other systems reviewed and are otherwise negative except as noted above.  Physical Exam    VS:  BP 106/82 (BP Location: Left Arm, Patient Position: Sitting, Cuff Size: Large)   Pulse (!) 104   Ht 5\' 5"  (1.651 m)   Wt 206 lb (93.4 kg)   SpO2 99%   BMI 34.28 kg/m  GEN: Well nourished, well developed, in no acute distress. HEENT: normal. Neck: Supple, no JVD, carotid bruits, or masses. Cardiac: RRR, no murmurs, rubs, or gallops. No clubbing, cyanosis, edema.  Radials/DP/PT 2+ and equal bilaterally.  Respiratory:  Respirations regular and unlabored, clear to auscultation bilaterally. GI: Soft, nontender, nondistended, BS + x 4. MS: no deformity or atrophy. Skin: warm and dry, no rash. Neuro:  Strength and sensation are intact. Psych: Normal affect.  Accessory Clinical Findings    ECG personally reviewed by me  today -    -  no EKG in office today.    Lab Results  Component Value Date   WBC 8.8 08/11/2022   HGB 13.5 08/11/2022   HCT 42.0 08/11/2022   MCV 94.4 08/11/2022   PLT 312 08/11/2022   Lab Results  Component Value Date   CREATININE 0.78 08/11/2022   BUN 10 08/11/2022   NA 137 08/11/2022   K 3.9 08/11/2022   CL 105 08/11/2022   CO2 25 08/11/2022   Lab Results  Component Value Date   ALT 41 08/11/2022   AST 34 08/11/2022   ALKPHOS 60 08/11/2022   BILITOT 0.3 08/11/2022   Lab Results  Component Value Date   CHOL  08/12/2009    138        ATP III CLASSIFICATION:  <200     mg/dL   Desirable  366-440  mg/dL   Borderline High  >=347    mg/dL   High          HDL 50 08/12/2009   LDLCALC  08/12/2009    80        Total Cholesterol/HDL:CHD Risk Coronary Heart Disease Risk Table                     Men   Women  1/2 Average Risk   3.4   3.3  Average Risk       5.0   4.4  2 X Average Risk   9.6   7.1  3 X Average Risk  23.4   11.0        Use the calculated Patient Ratio above and the CHD Risk Table to determine the patient's CHD Risk.        ATP III CLASSIFICATION (LDL):  <100     mg/dL   Optimal  425-956  mg/dL   Near or Above                    Optimal  130-159  mg/dL   Borderline  387-564  mg/dL   High  >332     mg/dL   Very High   TRIG 38 95/18/8416   CHOLHDL 2.8 08/12/2009    Lab Results  Component Value Date   HGBA1C  08/11/2009    5.0 (NOTE)                                                                       According to the ADA Clinical Practice Recommendations for 2011, when HbA1c is used as a screening test:   >=6.5%   Diagnostic of Diabetes Mellitus           (if abnormal result  is confirmed)  5.7-6.4%   Increased risk of developing Diabetes Mellitus  References:Diagnosis and Classification of Diabetes Mellitus,Diabetes Care,2011,34(Suppl 1):S62-S69 and Standards of Medical Care in         Diabetes - 2011,Diabetes Care,2011,34  (Suppl 1):S11-S61.     Assessment & Plan    1. ASD/history of TIA: S/p TIA in the setting of ASD. S/p closure with Amplatzer device in 2011. Repeat echocardiogram showed EF 45 to 50%, mildly decreased LV function, RWMA, septal hypokinesis, mildly reduced RV systolic function, stable Amplatzer septal occluder device with no residual shunt, no  other significant valvular abnormalities  2. POTS/chronic fatigue/cardiomyopathy/tachycardia/shortness of breath/precordial pain: Previously followed with a POTS specialist in [place], unfortunately, her insurance did not cover this.  She has chronic lightheadedness, palpitations, chronic fatigue and myalgias.  More recently, she has noted an elevated heart rate both at rest and with activity.  She has associated lightheadedness, shortness of breath, and chest discomfort. She reports progressive fatigue. Most recent echo as above. Given evidence of reduced EF, wall motion abnormalities on most recent echocardiogram, will pursue coronary CT angiogram for further restratification.  She will take ivabradine 15mg  prior to CT.  BMET today.  Pression tachycardia mediated cardiomyopathy.  Will start metoprolol tartrate 12.5 mg twice daily.  Consider increasing dose at follow-up if HR remains elevated and she tolerates with chronic fatigue.  Consider repeat echocardiogram in 3 months to reevaluate LVEF, pending ischemic evaluation.  In the setting of POTS, continue liberal fluid and salt intake, compression therapy, activity as tolerated.   3. Disposition: Follow-up as scheduled with Dr. Cristal Deer in 02/2023.       Joylene Grapes, NP 01/22/2023, 12:02 PM

## 2023-01-23 LAB — BASIC METABOLIC PANEL
BUN/Creatinine Ratio: 10 (ref 9–23)
BUN: 7 mg/dL (ref 6–24)
CO2: 20 mmol/L (ref 20–29)
Calcium: 9.3 mg/dL (ref 8.7–10.2)
Chloride: 103 mmol/L (ref 96–106)
Creatinine, Ser: 0.71 mg/dL (ref 0.57–1.00)
Glucose: 86 mg/dL (ref 70–99)
Potassium: 3.9 mmol/L (ref 3.5–5.2)
Sodium: 139 mmol/L (ref 134–144)
eGFR: 106 mL/min/{1.73_m2} (ref >59)

## 2023-01-29 ENCOUNTER — Encounter (HOSPITAL_COMMUNITY): Payer: Self-pay

## 2023-02-01 ENCOUNTER — Other Ambulatory Visit (HOSPITAL_COMMUNITY): Payer: Self-pay

## 2023-02-02 ENCOUNTER — Telehealth: Payer: Self-pay | Admitting: Nurse Practitioner

## 2023-02-02 ENCOUNTER — Other Ambulatory Visit: Payer: Self-pay

## 2023-02-02 MED ORDER — METOPROLOL TARTRATE 25 MG PO TABS: 25.0000 mg | ORAL_TABLET | Freq: Two times a day (BID) | ORAL | 3 refills | Status: DC

## 2023-02-02 NOTE — Telephone Encounter (Signed)
 Called and spoke with patient. She states that she has upcoming CT scan on Thursday and is feeling that her heart rate is still to fast.  States that ARNP put her on baby dose of lopressor  by taking 1/2 tablet bid. She has been monitoring her heart rate daily and average is 104, range is between 90-110. States she is concerned that her heart rate is still not low enough. Informed her I would forward this message to ARNP for advise.

## 2023-02-02 NOTE — Telephone Encounter (Signed)
Hr : 110 -today   Pt called in stating she has been trying to get her hr down prior to her CT. She states it is still high and wants to know what she should do.

## 2023-02-02 NOTE — Telephone Encounter (Signed)
 Called patient and gave her the following response from ARNP. Daneen Damien BROCKS, NP  Rufina Anniece LABOR, RN Caller: Unspecified (Today, 10:07 AM) We can increase metoprolol  to 25 mg daily if BP is stable.  She will take additional medication prior to her CT that should lower her heart rate as well.  Otherwise, follow-up as planned.  Thank you-EM

## 2023-02-03 DIAGNOSIS — R0602 Shortness of breath: Secondary | ICD-10-CM | POA: Diagnosis not present

## 2023-02-03 DIAGNOSIS — R052 Subacute cough: Secondary | ICD-10-CM | POA: Diagnosis not present

## 2023-02-04 ENCOUNTER — Ambulatory Visit (HOSPITAL_COMMUNITY)
Admission: RE | Admit: 2023-02-04 | Discharge: 2023-02-04 | Disposition: A | Discharge status: Home / Self Care | Payer: BC Managed Care – PPO | Source: Ambulatory Visit | Attending: Nurse Practitioner | Admitting: Nurse Practitioner

## 2023-02-04 DIAGNOSIS — I429 Cardiomyopathy, unspecified: Secondary | ICD-10-CM | POA: Diagnosis not present

## 2023-02-04 DIAGNOSIS — R072 Precordial pain: Secondary | ICD-10-CM | POA: Insufficient documentation

## 2023-02-04 DIAGNOSIS — R0602 Shortness of breath: Secondary | ICD-10-CM | POA: Diagnosis not present

## 2023-02-04 MED ORDER — IOHEXOL 350 MG/ML SOLN
95.0000 mL | Freq: Once | INTRAVENOUS | Status: AC | PRN
  Administered 2023-02-04: 95 mL via INTRAVENOUS

## 2023-02-04 MED ORDER — NITROGLYCERIN 0.4 MG SL SUBL
SUBLINGUAL_TABLET | SUBLINGUAL | Status: AC
  Filled 2023-02-04: qty 2

## 2023-02-04 MED ORDER — NITROGLYCERIN 0.4 MG SL SUBL
0.8000 mg | SUBLINGUAL_TABLET | Freq: Once | SUBLINGUAL | Status: AC
  Administered 2023-02-04: 0.8 mg via SUBLINGUAL

## 2023-02-09 DIAGNOSIS — G43719 Chronic migraine without aura, intractable, without status migrainosus: Secondary | ICD-10-CM | POA: Diagnosis not present

## 2023-02-10 DIAGNOSIS — G43109 Migraine with aura, not intractable, without status migrainosus: Secondary | ICD-10-CM | POA: Diagnosis not present

## 2023-02-10 DIAGNOSIS — E538 Deficiency of other specified B group vitamins: Secondary | ICD-10-CM | POA: Diagnosis not present

## 2023-02-10 DIAGNOSIS — K219 Gastro-esophageal reflux disease without esophagitis: Secondary | ICD-10-CM | POA: Diagnosis not present

## 2023-02-10 DIAGNOSIS — E66811 Obesity, class 1: Secondary | ICD-10-CM | POA: Diagnosis not present

## 2023-02-11 ENCOUNTER — Ambulatory Visit (HOSPITAL_BASED_OUTPATIENT_CLINIC_OR_DEPARTMENT_OTHER): Payer: BC Managed Care – PPO | Admitting: Cardiology

## 2023-02-11 VITALS — BP 106/82 | HR 82 | Ht 65.0 in | Wt 209.5 lb

## 2023-02-11 DIAGNOSIS — Z8673 Personal history of transient ischemic attack (TIA), and cerebral infarction without residual deficits: Secondary | ICD-10-CM

## 2023-02-11 DIAGNOSIS — R5382 Chronic fatigue, unspecified: Secondary | ICD-10-CM | POA: Diagnosis not present

## 2023-02-11 DIAGNOSIS — G90A Postural orthostatic tachycardia syndrome (POTS): Secondary | ICD-10-CM

## 2023-02-11 DIAGNOSIS — Q211 Atrial septal defect, unspecified: Secondary | ICD-10-CM | POA: Diagnosis not present

## 2023-02-11 DIAGNOSIS — M797 Fibromyalgia: Secondary | ICD-10-CM

## 2023-02-11 NOTE — Progress Notes (Signed)
 Cardiology Office Note:  .   Date:  02/19/2023  ID:  Tonya Andrews, DOB 1975/03/04, MRN [id number] PCP: Verena Mems, MD   HeartCare Providers Cardiologist:  Shelda Bruckner, MD {  History of Present Illness: .   Tonya Andrews is a 48 y.o. female with PMH ASD s/p closure in 2011, TIA, POTS, chronic fatigue, fibromyalgia. She was previously followed by Dr. Wonda and then Dr. Hobart and established care with me on 02/11/23.  Pertinent CV history: History of TIA, had ASD s/p Amplatzer in 2011. Had seen a specialist in [place] for her POTS, chronic fatigue, and fibromyalgia. Most recently seen by Damien Braver, NP. Noted elevated heart rate, lightheadedness, shortness of breath, and chest tightness. Echo showed EF 45-50% with septal hypokinesis, stable amplatzer, no significant valve issues. Monitor showed HR range 64-173 (sinus), average 101 bpm, no significant arrhythmia. CT coronary showed no calcium, no plaque, no evidence of shunt around amplatzer.  Today: Reviewed her CT results today, excellent news.   Reviewed her symptoms. Fatigued all the time. Cymbalta  has significantly helped her fibromyalgia pain.   She can feel her heart racing sometimes but has not felt much differently on metoprolol . Discussed sinus rhythm, vagal tone, etc.  Working with [employer] on weight loss. Is on her feet for her job, but does not have energy at the end of the day for intentional exercise.  Has long history of migraine, follows with the headache center for these. Was on topiramate  with great improvement, but had to stop due to insurance. After she stopped, noted significant weight gain. Has restarted this, though at a lower dose than prior.  Eats healthy, mostly vegetarian. Is celiac sensitive.  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 She does get ill frequently from viruses at her job,  and when she does get sick she gets severe symptoms like bronchitis.  ROS: Denies chest pain, shortness of breath at rest or with normal exertion. No PND, orthopnea, LE edema. No syncope or palpitations. ROS otherwise negative except as noted.   Studies Reviewed: SABRA    EKG:       Physical Exam:   VS:  BP 106/82   Pulse 82   Ht 5' 5 (1.651 m)   Wt 209 lb 8 oz (95 kg)   SpO2 96%   BMI 34.86 kg/m    Wt Readings from Last 3 Encounters:  02/11/23 209 lb 8 oz (95 kg)  01/22/23 206 lb (93.4 kg)  12/11/22 211 lb 9.6 oz (96 kg)    GEN: Well nourished, well developed in no acute distress HEENT: Normal, moist mucous membranes NECK: No JVD CARDIAC: regular rhythm, normal S1 and S2, no rubs or gallops. No murmur. VASCULAR: Radial and DP pulses 2+ bilaterally. No carotid bruits RESPIRATORY:  Clear to auscultation without rales, wheezing or rhonchi  ABDOMEN: Soft, non-tender, non-distended MUSCULOSKELETAL:  Ambulates independently SKIN: Warm and dry, no edema NEUROLOGIC:  Alert and oriented x 3. No focal neuro deficits noted. PSYCHIATRIC:  Normal affect    ASSESSMENT AND PLAN: .    POTS Chronic fatigue Fibromyalgia -we discussed the above, how symptoms overlap, recommendations for management -also discussed metoprolol . This may help with palpitations but can also cause some fatigue. Given instructions below on how to see if her symptoms are better either with or without metoprolol   Instructions given today: -avoid  dehydration. Often it requires high volumes of fluids, often with salt/electrolytes included, to stay hydrated. People with POTS are very sensitive to fluid shifts and dehydration. Oral rehydration is preferred, and routine use of IV fluids is not recommended. -if tolerated, compression stocking can assist with fluid management and prevent pooling in the legs. -slow position changes are recommended -if there is a feeling of severe lightheadedness, like near to passing out,  recommend lying on the floor on the back, with legs elevated up on a chair or up against the wall. -the best long term management of POTS symptoms is gradual exercise conditioning. I recommend seated exercises such as bike to start, to avoid the risk of falling with lightheadedness. Exercise programs, either through supervised programs like cardiac rehab or through personal programs, should focus on gradually increasing exercise tolerance and conditioning.  -this is a link to specific exercise recommendations for POTS: Http://peterson-powell.net/ -we discussed the typical spectrum of dysautonomia, including typical populations, that this sometimes spontaneously improves with age (though a small percentage have persistent symptoms), that this has uncomfortable symptoms but is not associated with long term mortality, and that the etiology/treatment of this is an area of active research   You can look into the website kdxobr.com  If you want to try to see if a different dose of metoprolol  makes you feel better, you can cut back to 1/2 a pill twice a day. If you want to try off of it entirely, cut it to 1/2 a pill twice a day for two weeks, then 1/2 a pill just in the AM for a week, then stop.  History of TIA ASD s/p closure 2011 -no residual shunt  CV risk counseling and prevention -recommend heart healthy/Mediterranean diet, with whole grains, fruits, vegetable, fish, lean meats, nuts, and olive oil. Limit salt. -recommend moderate walking, 3-5 times/week for 30-50 minutes each session. Aim for at least 150 minutes.week. Goal should be pace of 3 miles/hours, or walking 1.5 miles in 30 minutes -recommend avoidance of tobacco products. Avoid excess alcohol. -ASCVD risk score: The ASCVD Risk score (Arnett DK, et al., 2019) failed to calculate for the following reasons:   Risk score cannot be calculated  because patient has a medical history suggesting prior/existing ASCVD    Dispo: 1 year or sooner as needed  Total time of encounter: I spent 40 minutes dedicated to the care of this patient on the date of this encounter to include pre-visit review of records, face-to-face time with the patient discussing conditions above, and clinical documentation with the electronic health record. We specifically spent time today discussing POTS, recommendations for management,  overlap of symptoms  Signed, Shelda Bruckner, MD   Shelda Bruckner, MD, PhD, [facility] [place]  [facility]  [place]  [facility] at [facility] at [facility] [address], [address] [place], [place] [address] [phone]

## 2023-02-11 NOTE — Patient Instructions (Addendum)
 Medication Instructions:  Your physician recommends that you continue on your current medications as directed. Please refer to the Current Medication list given to you today.  *If you need a refill on your cardiac medications before your next appointment, please call your pharmacy*   Follow-Up: At [facility], you and your health needs are our priority.  As part of our continuing mission to provide you with exceptional heart care, we have created designated Provider Care Teams.  These Care Teams include your primary Cardiologist (physician) and Advanced Practice Providers (APPs -  Physician Assistants and Nurse Practitioners) who all work together to provide you with the care you need, when you need it.  We recommend signing up for the patient portal called MyChart.  Sign up information is provided on this After Visit Summary.  MyChart is used to connect with patients for Virtual Visits (Telemedicine).  Patients are able to view lab/test results, encounter notes, upcoming appointments, etc.  Non-urgent messages can be sent to your provider as well.   To learn more about what you can do with MyChart, go to [email or web address].    Your next appointment:   12 month(s)  Provider:   Shelda Bruckner, MD    Other Instructions      Postural Orthostatic Tachycardia Syndrome:  -avoid dehydration. Often it requires high volumes of fluids, often with salt/electrolytes included, to stay hydrated. People with POTS are very sensitive to fluid shifts and dehydration. Oral rehydration is preferred, and routine use of IV fluids is not recommended. -if tolerated, compression stocking can assist with fluid management and prevent pooling in the legs. -slow position changes are recommended -if there is a feeling of severe lightheadedness, like near to passing out, recommend lying on the floor on the back, with legs elevated up on a chair or up against the wall. -the best long term  management of POTS symptoms is gradual exercise conditioning. I recommend seated exercises such as bike to start, to avoid the risk of falling with lightheadedness. Exercise programs, either through supervised programs like cardiac rehab or through personal programs, should focus on gradually increasing exercise tolerance and conditioning.  -this is a link to specific exercise recommendations for POTS: Http://peterson-powell.net/ -we discussed the typical spectrum of dysautonomia, including typical populations, that this sometimes spontaneously improves with age (though a small percentage have persistent symptoms), that this has uncomfortable symptoms but is not associated with long term mortality, and that the etiology/treatment of this is an area of active research   You can look into the website kdxobr.com  If you want to try to see if a different dose of metoprolol  makes you feel better, you can cut back to 1/2 a pill twice a day. If you want to try off of it entirely, cut it to 1/2 a pill twice a day for two weeks, then 1/2 a pill just in the AM for a week, then stop.

## 2023-02-19 ENCOUNTER — Encounter (HOSPITAL_BASED_OUTPATIENT_CLINIC_OR_DEPARTMENT_OTHER): Payer: Self-pay | Admitting: Cardiology

## 2023-02-26 ENCOUNTER — Telehealth (HOSPITAL_BASED_OUTPATIENT_CLINIC_OR_DEPARTMENT_OTHER): Payer: Self-pay

## 2023-02-26 NOTE — Telephone Encounter (Signed)
Spoke with pt. Pt was notified of monitor results. Pt will continue current medication and f/u as planned.

## 2023-03-08 DIAGNOSIS — M1711 Unilateral primary osteoarthritis, right knee: Secondary | ICD-10-CM | POA: Diagnosis not present

## 2023-03-08 DIAGNOSIS — M1712 Unilateral primary osteoarthritis, left knee: Secondary | ICD-10-CM | POA: Diagnosis not present

## 2023-03-08 DIAGNOSIS — M170 Bilateral primary osteoarthritis of knee: Secondary | ICD-10-CM | POA: Diagnosis not present

## 2023-03-20 DIAGNOSIS — J209 Acute bronchitis, unspecified: Secondary | ICD-10-CM | POA: Diagnosis not present

## 2023-03-31 DIAGNOSIS — G43109 Migraine with aura, not intractable, without status migrainosus: Secondary | ICD-10-CM | POA: Diagnosis not present

## 2023-03-31 DIAGNOSIS — K219 Gastro-esophageal reflux disease without esophagitis: Secondary | ICD-10-CM | POA: Diagnosis not present

## 2023-03-31 DIAGNOSIS — E6609 Other obesity due to excess calories: Secondary | ICD-10-CM | POA: Diagnosis not present

## 2023-04-12 ENCOUNTER — Telehealth: Payer: Self-pay | Admitting: *Deleted

## 2023-04-12 NOTE — Telephone Encounter (Signed)
   Pre-operative Risk Assessment    Patient Name: Tonya Andrews  DOB: 15-Feb-1975 MRN: [id number]   Date of last office visit: 02/11/23 DR. BRIDGETTE CHRISTOPHER Date of next office visit: NONE   Request for Surgical Clearance    Procedure:   RIGHT TOTAL KNEE ARTHROPLASTY  Date of Surgery:  Clearance TBD                                Surgeon:  DR. Marcene Corning Surgeon's Group or Practice Name:  [facility] Phone number:  [phone] REBECCA LANG Fax number:  [phone]   Type of Clearance Requested:   - Medical ; NONE INDICATED TO BE HELD   Type of Anesthesia:   CHOICE   Additional requests/questions:    Elpidio Anis   04/12/2023, 3:10 PM

## 2023-04-13 NOTE — Telephone Encounter (Signed)
 Patient was recently seen 02/11/2023 by Dr. Cristal Deer.  Dr. Cristal Deer, are you able to provide clearance for upcoming right knee replacement based upon your visit with her on 02/11/2023? Please route back to P CV DIV PREOP. Tereso Newcomer, PA-C    04/13/2023 5:23 PM

## 2023-04-15 NOTE — Telephone Encounter (Signed)
 Called, no answer. Did not leave duplicative VM.   Alver Sorrow, NP

## 2023-04-15 NOTE — Telephone Encounter (Signed)
   Name: Tonya Andrews  DOB: 1975/06/11  MRN: [id number]   Primary Cardiologist: Jodelle Red, MD  Chart reviewed as part of pre-operative protocol coverage. Patient was contacted 04/15/2023 in reference to pre-operative risk assessment for pending surgery as outlined below.  Tonya Andrews was last seen on 02/11/23 by Dr. Cristal Deer.  Since that day, Tonya Andrews has done well and can achieve >4 METS.  Therefore, based on ACC/AHA guidelines, the patient would be at acceptable risk for the planned procedure without further cardiovascular testing.   Recommend holding OTC fish oil one week prior to surgery, she is aware of this recommendation.   The patient was advised that if she develops new symptoms prior to surgery to contact our office to arrange for a follow-up visit, and she verbalized understanding.  I will route this recommendation to the requesting party via Epic fax function and remove from pre-op pool. Please call with questions.  Alver Sorrow, NP 04/15/2023, 4:26 PM

## 2023-04-15 NOTE — Telephone Encounter (Signed)
 As Dr. Cristal Deer out of office, called Miss Knoop to assess how she has been feeling since her last OV. No answer, VM left per DPR. MyChart message sent.   Alver Sorrow, NP

## 2023-04-19 DIAGNOSIS — Z01818 Encounter for other preprocedural examination: Secondary | ICD-10-CM | POA: Diagnosis not present

## 2023-04-19 DIAGNOSIS — Z79899 Other long term (current) drug therapy: Secondary | ICD-10-CM | POA: Diagnosis not present

## 2023-04-19 DIAGNOSIS — M129 Arthropathy, unspecified: Secondary | ICD-10-CM | POA: Diagnosis not present

## 2023-04-28 DIAGNOSIS — K219 Gastro-esophageal reflux disease without esophagitis: Secondary | ICD-10-CM | POA: Diagnosis not present

## 2023-04-28 DIAGNOSIS — G43109 Migraine with aura, not intractable, without status migrainosus: Secondary | ICD-10-CM | POA: Diagnosis not present

## 2023-04-28 DIAGNOSIS — E6609 Other obesity due to excess calories: Secondary | ICD-10-CM | POA: Diagnosis not present

## 2023-05-10 DIAGNOSIS — M542 Cervicalgia: Secondary | ICD-10-CM | POA: Diagnosis not present

## 2023-05-10 DIAGNOSIS — M545 Low back pain, unspecified: Secondary | ICD-10-CM | POA: Diagnosis not present

## 2023-05-10 DIAGNOSIS — M1711 Unilateral primary osteoarthritis, right knee: Secondary | ICD-10-CM | POA: Diagnosis not present

## 2023-05-10 DIAGNOSIS — M546 Pain in thoracic spine: Secondary | ICD-10-CM | POA: Diagnosis not present

## 2023-05-18 DIAGNOSIS — K219 Gastro-esophageal reflux disease without esophagitis: Secondary | ICD-10-CM | POA: Diagnosis not present

## 2023-05-18 DIAGNOSIS — G43109 Migraine with aura, not intractable, without status migrainosus: Secondary | ICD-10-CM | POA: Diagnosis not present

## 2023-05-18 DIAGNOSIS — E66811 Obesity, class 1: Secondary | ICD-10-CM | POA: Diagnosis not present

## 2023-05-18 DIAGNOSIS — E6609 Other obesity due to excess calories: Secondary | ICD-10-CM | POA: Diagnosis not present

## 2023-06-02 ENCOUNTER — Other Ambulatory Visit: Payer: Self-pay | Admitting: Orthopaedic Surgery

## 2023-06-08 NOTE — Progress Notes (Addendum)
 Anesthesia Review:  PCP: Olin Bertin  Cardiologist : Sheryle Donning LOV 02/11/23  Caitlin Walker,NP clearance 04/15/23  Followed by [facility] for weight loss   PPM/ ICD: Device Orders: Rep Notified:  Chest x-ray : 08/10/22-2 view  EKG : 12/21/22  and 06/11/23  CT Cors- 02/13/23  Echo : 01/03/23  Monitor- 02/17/23  Stress test: Cardiac Cath :   Activity level: can do a flight of stairs without difficutly  Sleep Study/ CPAP : none  Fasting Blood Sugar :      / Checks Blood Sugar -- times a day:    Blood Thinner/ Instructions /Last Dose: ASA / Instructions/ Last Dose :    Zepbound- last dose on 05/30/23 per pt

## 2023-06-08 NOTE — Patient Instructions (Signed)
 SURGICAL WAITING ROOM VISITATION  Patients having surgery or a procedure may have no more than 2 support people in the waiting area - these visitors may rotate.    Children under the age of 28 must have an adult with them who is not the patient.  Due to an increase in RSV and influenza rates and associated hospitalizations, children ages 15 and under may not visit patients in [facility] hospitals.  Visitors with respiratory illnesses are discouraged from visiting and should remain at home.  If the patient needs to stay at the hospital during part of their recovery, the visitor guidelines for inpatient rooms apply. Pre-op nurse will coordinate an appropriate time for 1 support person to accompany patient in pre-op.  This support person may not rotate.    Please refer to the [facility] website for the visitor guidelines for Inpatients (after your surgery is over and you are in a regular room).       Your procedure is scheduled on:  06/15/2023    Report to [facility] Main Entrance    Report to admitting at   [phone]   Call this number if you have problems the morning of surgery [phone]   Do not eat food :After Midnight.   After Midnight you may have the following liquids until _ 0430_____ AM DAY OF SURGERY  Water Non-Citrus Juices (without pulp, NO RED-Apple, White grape, White cranberry) Black Coffee (NO MILK/CREAM OR CREAMERS, sugar ok)  Clear Tea (NO MILK/CREAM OR CREAMERS, sugar ok) regular and decaf                             Plain Jell-O (NO RED)                                           Fruit ices (not with fruit pulp, NO RED)                                     Popsicles (NO RED)                                                               Sports drinks like Gatorade (NO RED)                    The day of surgery:  Drink ONE (1) Pre-Surgery Clear Ensure or G2 at  0430AM  ( have completed by ) the morning of surgery. Drink in one sitting. Do not sip.   This drink was given to you during your hospital  pre-op appointment visit. Nothing else to drink after completing the  Pre-Surgery Clear Ensure or G2.          If you have questions, please contact your surgeon's office.       Oral Hygiene is also important to reduce your risk of infection.  Remember - BRUSH YOUR TEETH THE MORNING OF SURGERY WITH YOUR REGULAR TOOTHPASTE  DENTURES WILL BE REMOVED PRIOR TO SURGERY PLEASE DO NOT APPLY "Poly grip" OR ADHESIVES!!!   Do NOT smoke after Midnight   Stop all vitamins and herbal supplements 7 days before surgery.   Take these medicines the morning of surgery with A SIP OF WATER:  inhalers as usual and bring, zyrtec, cymbalta, omeprazole   DO NOT TAKE ANY ORAL DIABETIC MEDICATIONS DAY OF YOUR SURGERY  Bring CPAP mask and tubing day of surgery.                              You may not have any metal on your body including hair pins, jewelry, and body piercing             Do not wear make-up, lotions, powders, perfumes/cologne, or deodorant  Do not wear nail polish including gel and S&S, artificial/acrylic nails, or any other type of covering on natural nails including finger and toenails. If you have artificial nails, gel coating, etc. that needs to be removed by a nail salon please have this removed prior to surgery or surgery may need to be canceled/ delayed if the surgeon/ anesthesia feels like they are unable to be safely monitored.   Do not shave  48 hours prior to surgery.               Men may shave face and neck.   Do not bring valuables to the hospital. [facility] IS NOT             RESPONSIBLE   FOR VALUABLES.   Contacts, glasses, dentures or bridgework may not be worn into surgery.   Bring small overnight bag day of surgery.   DO NOT BRING YOUR HOME MEDICATIONS TO THE HOSPITAL. PHARMACY WILL DISPENSE MEDICATIONS LISTED ON YOUR MEDICATION LIST TO YOU DURING YOUR ADMISSION IN THE  HOSPITAL!    Patients discharged on the day of surgery will not be allowed to drive home.  Someone NEEDS to stay with you for the first 24 hours after anesthesia.   Special Instructions: Bring a copy of your healthcare power of attorney and living will documents the day of surgery if you haven't scanned them before.              Please read over the following fact sheets you were given: IF YOU HAVE QUESTIONS ABOUT YOUR PRE-OP INSTRUCTIONS PLEASE CALL [phone]   If you received a COVID test during your pre-op visit  it is requested that you wear a mask when out in public, stay away from anyone that may not be feeling well and notify your surgeon if you develop symptoms. If you test positive for Covid or have been in contact with anyone that has tested positive in the last 10 days please notify you surgeon.      Pre-operative 5 CHG Bath Instructions   You can play a key role in reducing the risk of infection after surgery. Your skin needs to be as free of germs as possible. You can reduce the number of germs on your skin by washing with CHG (chlorhexidine  gluconate) soap before surgery. CHG is an antiseptic soap that kills germs and continues to kill germs even after washing.   DO NOT use if you have an allergy  to chlorhexidine /CHG or antibacterial soaps. If your skin becomes reddened or irritated, stop using the CHG  and notify one of our RNs at [phone].   Please shower with the CHG soap starting 4 days before surgery using the following schedule:     Please keep in mind the following:  DO NOT shave, including legs and underarms, starting the day of your first shower.   You may shave your face at any point before/day of surgery.  Place clean sheets on your bed the day you start using CHG soap. Use a clean washcloth (not used since being washed) for each shower. DO NOT sleep with pets once you start using the CHG.   CHG Shower Instructions:  If you choose to wash your hair and  private area, wash first with your normal shampoo/soap.  After you use shampoo/soap, rinse your hair and body thoroughly to remove shampoo/soap residue.  Turn the water OFF and apply about 3 tablespoons (45 ml) of CHG soap to a CLEAN washcloth.  Apply CHG soap ONLY FROM YOUR NECK DOWN TO YOUR TOES (washing for 3-5 minutes)  DO NOT use CHG soap on face, private areas, open wounds, or sores.  Pay special attention to the area where your surgery is being performed.  If you are having back surgery, having someone wash your back for you may be helpful. Wait 2 minutes after CHG soap is applied, then you may rinse off the CHG soap.  Pat dry with a clean towel  Put on clean clothes/pajamas   If you choose to wear lotion, please use ONLY the CHG-compatible lotions on the back of this paper.     Additional instructions for the day of surgery: DO NOT APPLY any lotions, deodorants, cologne, or perfumes.   Put on clean/comfortable clothes.  Brush your teeth.  Ask your nurse before applying any prescription medications to the skin.      CHG Compatible Lotions   Aveeno Moisturizing lotion  Cetaphil Moisturizing Cream  Cetaphil Moisturizing Lotion  Clairol Herbal Essence Moisturizing Lotion, Dry Skin  Clairol Herbal Essence Moisturizing Lotion, Extra Dry Skin  Clairol Herbal Essence Moisturizing Lotion, Normal Skin  Curel Age Defying Therapeutic Moisturizing Lotion with Alpha Hydroxy  Curel Extreme Care Body Lotion  Curel Soothing Hands Moisturizing Hand Lotion  Curel Therapeutic Moisturizing Cream, Fragrance-Free  Curel Therapeutic Moisturizing Lotion, Fragrance-Free  Curel Therapeutic Moisturizing Lotion, Original Formula  Eucerin Daily Replenishing Lotion  Eucerin Dry Skin Therapy Plus Alpha Hydroxy Crme  Eucerin Dry Skin Therapy Plus Alpha Hydroxy Lotion  Eucerin Original Crme  Eucerin Original Lotion  Eucerin Plus Crme Eucerin Plus Lotion  Eucerin TriLipid Replenishing Lotion  Keri  Anti-Bacterial Hand Lotion  Keri Deep Conditioning Original Lotion Dry Skin Formula Softly Scented  Keri Deep Conditioning Original Lotion, Fragrance Free Sensitive Skin Formula  Keri Lotion Fast Absorbing Fragrance Free Sensitive Skin Formula  Keri Lotion Fast Absorbing Softly Scented Dry Skin Formula  Keri Original Lotion  Keri Skin Renewal Lotion Keri Silky Smooth Lotion  Keri Silky Smooth Sensitive Skin Lotion  Nivea Body Creamy Conditioning Oil  Nivea Body Extra Enriched Teacher, adult education Moisturizing Lotion Nivea Crme  Nivea Skin Firming Lotion  NutraDerm 30 Skin Lotion  NutraDerm Skin Lotion  NutraDerm Therapeutic Skin Cream  NutraDerm Therapeutic Skin Lotion  ProShield Protective Hand Cream  Provon moisturizing lotion

## 2023-06-09 NOTE — H&P (Signed)
 TOTAL KNEE ADMISSION H&P  Patient is being admitted for right total knee arthroplasty.  Subjective:  Chief Complaint:right knee pain.  HPI: Tonya Andrews, 48 y.o. female, has a history of pain and functional disability in the right knee due to arthritis and has failed non-surgical conservative treatments for greater than 12 weeks to includeNSAID's and/or analgesics, corticosteriod injections, viscosupplementation injections, flexibility and strengthening excercises, supervised PT with diminished ADL's post treatment, use of assistive devices, weight reduction as appropriate, and activity modification.  Onset of symptoms was gradual, starting 5 years ago with gradually worsening course since that time. The patient noted no past surgery on the right knee(s).  Patient currently rates pain in the right knee(s) at 10 out of 10 with activity. Patient has night pain, worsening of pain with activity and weight bearing, pain that interferes with activities of daily living, crepitus, and joint swelling.  Patient has evidence of subchondral cysts, subchondral sclerosis, periarticular osteophytes, and joint space narrowing by imaging studies. There is no active infection.  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   History of cardioembolic cerebrovascular accident (CVA) 05/02/2009   Migraine 05/02/2009   Past Medical History:  Diagnosis Date    Fibromyalgia    Gallstones    Migraine headache    Dr Margie Sheller   Nasal polyp    Nerve pain    PFO (patent foramen ovale) 04/2009   by bubble study/TEE, Dr Arlester Ladd, cardiology   Pneumonia 2008   PONV (postoperative nausea and vomiting)    Seasonal allergies    Stroke (HCC) 04/2009, 07/2009   Dr Janett Medin    Past Surgical History:  Procedure Laterality Date   CHOLECYSTECTOMY N/A 05/30/2019   Procedure: LAPAROSCOPIC CHOLECYSTECTOMY;  Surgeon: Aldean Hummingbird, MD;  Location: [facility];  Service: General;  Laterality: N/A;   KNEE SURGERY Right    x2   PATENT FORAMEN OVALE(PFO) CLOSURE  09/2009   transcath closure    WISDOM TOOTH EXTRACTION      No current facility-administered medications for this encounter.   Current Outpatient Medications  Medication Sig Dispense Refill Last Dose/Taking   albuterol  (VENTOLIN  HFA) 108 (90 Base) MCG/ACT inhaler Inhale 1-2 puffs into the lungs every 6 (six) hours as needed for wheezing or shortness of breath. 8.5 g 0 Taking As Needed   cetirizine (ZYRTEC) 10 MG tablet Take 10 mg by mouth daily.   Taking   Cyanocobalamin  (VITAMIN B-12) 5000 MCG TBDP Take 5,000 mcg by mouth daily.   Taking   DULoxetine (CYMBALTA) 30 MG capsule Take 30-60 mg by mouth See admin instructions. Take 30 mg in the morning and 60 mg at bedtime   Taking   EMGALITY 120 MG/ML SOAJ Inject 120 mg into the skin every 30 (thirty) days.   Taking   Ferrous Sulfate (IRON PO) Take 1 tablet by mouth daily. Gummy   Taking   fluticasone  (FLONASE ) 50 MCG/ACT nasal spray Place 2 sprays into both nostrils daily for 7 days. (Patient taking differently: Place 2 sprays  into both nostrils daily as needed for allergies.) 16 g 2 Taking Differently   ibuprofen (ADVIL) 200 MG tablet Take 400 mg by mouth daily.   Taking   Multiple Vitamins-Minerals (MULTIVITAMIN WITH MINERALS) tablet Take 1 tablet by mouth daily. Gummy   Taking   omeprazole (PRILOSEC) 20 MG capsule Take 20 mg by mouth 2 (two) times daily before a  meal.   Taking   Probiotic Product (PROBIOTIC PO) Take 1 tablet by mouth daily. 30 billion   Taking   metoprolol  tartrate (LOPRESSOR ) 25 MG tablet Take 1 tablet (25 mg total) by mouth 2 (two) times daily. (Patient not taking: Reported on 06/08/2023) 30 tablet 3 Not Taking   Allergies  Allergen Reactions   Amoxicillin Nausea And Vomiting    Does not ever want again. Made her feel bad.     Nifedipine Other (See Comments)   Rho D Immune Globulin Other (See Comments)   Latex Other (See Comments)    Unknown to patient, "happened during surgery"    Social History   Tobacco Use   Smoking status: Never    Passive exposure: Never   Smokeless tobacco: Never  Substance Use Topics   Alcohol use: Not Currently    Comment: 1 wine    Family History  Problem Relation Age of Onset   Hypertension Mother    Arthritis/Rheumatoid Father    Eczema Son    Asthma Son    Allergic rhinitis Son    Eczema Daughter    Asthma Daughter    Eczema Daughter    Asthma Daughter    Allergic rhinitis Daughter      Review of Systems  Musculoskeletal:  Positive for arthralgias.       Right knee  All other systems reviewed and are negative.   Objective:  Physical Exam Constitutional:      Appearance: Normal appearance.  HENT:     Head: Normocephalic and atraumatic.     Mouth/Throat:     Pharynx: Oropharynx is clear.  Eyes:     Extraocular Movements: Extraocular movements intact.  Pulmonary:     Effort: Pulmonary effort is normal.  Abdominal:     Palpations: Abdomen is soft.  Musculoskeletal:     Cervical back: Normal range of motion.     Comments: Range of motion of the right knee is from 0-120 is of flexion.  1+ crepitation.  She is a mild valgus deformity.  Tenderness palpation on her joint line.  Ligaments are stable.  Calf is soft and nontender.  She is neurovascularly intact distally.  Skin:    General: Skin is warm and dry.  Neurological:     General: No focal deficit present.     Mental  Status: She is alert and oriented to person, place, and time. Mental status is at baseline.  Psychiatric:        Mood and Affect: Mood normal.        Behavior: Behavior normal.        Thought Content: Thought content normal.        Judgment: Judgment normal.     Vital signs in last 24 hours: BP: ()/()  Arterial Line BP: ()/()   Labs:   Estimated body mass index is 34.86 kg/m as calculated from the following:   Height as of 02/11/23: 5\' 5"  (1.651 m).   Weight as of 02/11/23: 95 kg.   Imaging Review Plain radiographs demonstrate severe degenerative joint disease of the right knee(s). The overall alignment  isneutral. The bone quality appears to be good for age and reported activity level.      Assessment/Plan:  End stage primary arthritis, right knee   The patient history, physical examination, clinical judgment of the provider and imaging studies are consistent with end stage degenerative joint disease of the right knee(s) and total knee arthroplasty is deemed medically necessary. The treatment options including medical management, injection therapy arthroscopy and arthroplasty were discussed at length. The risks and benefits of total knee arthroplasty were presented and reviewed. The risks due to aseptic loosening, infection, stiffness, patella tracking problems, thromboembolic complications and other imponderables were discussed. The patient acknowledged the explanation, agreed to proceed with the plan and consent was signed. Patient is being admitted for inpatient treatment for surgery, pain control, PT, OT, prophylactic antibiotics, VTE prophylaxis, progressive ambulation and ADL's and discharge planning. The patient is planning to be discharged home with home health services   Patient's anticipated LOS is less than 2 midnights, meeting these requirements: - Younger than 43 - Lives within 1 hour of care - Has a competent adult at home to recover with post-op recover - NO history  of  - Chronic pain requiring opiods  - Diabetes  - Coronary Artery Disease  - Heart failure  - Heart attack  - Stroke  - DVT/VTE  - Cardiac arrhythmia  - Respiratory Failure/COPD  - Renal failure  - Anemia  - Advanced Liver disease

## 2023-06-11 ENCOUNTER — Other Ambulatory Visit: Payer: Self-pay

## 2023-06-11 ENCOUNTER — Encounter (HOSPITAL_COMMUNITY): Payer: Self-pay

## 2023-06-11 ENCOUNTER — Encounter (HOSPITAL_COMMUNITY)
Admission: RE | Admit: 2023-06-11 | Discharge: 2023-06-11 | Disposition: A | Discharge status: Home / Self Care | Source: Ambulatory Visit | Attending: Orthopaedic Surgery | Admitting: Orthopaedic Surgery

## 2023-06-11 ENCOUNTER — Ambulatory Visit (HOSPITAL_COMMUNITY)
Admission: RE | Admit: 2023-06-11 | Discharge: 2023-06-11 | Disposition: A | Discharge status: Home / Self Care | Source: Ambulatory Visit | Attending: Orthopaedic Surgery | Admitting: Orthopaedic Surgery

## 2023-06-11 VITALS — BP 115/90 | HR 85 | Temp 98.5°F | Resp 16 | Ht 65.0 in | Wt 191.0 lb

## 2023-06-11 DIAGNOSIS — Z01818 Encounter for other preprocedural examination: Secondary | ICD-10-CM | POA: Diagnosis not present

## 2023-06-11 DIAGNOSIS — Z8673 Personal history of transient ischemic attack (TIA), and cerebral infarction without residual deficits: Secondary | ICD-10-CM | POA: Insufficient documentation

## 2023-06-11 DIAGNOSIS — R262 Difficulty in walking, not elsewhere classified: Secondary | ICD-10-CM | POA: Diagnosis not present

## 2023-06-11 DIAGNOSIS — M25561 Pain in right knee: Secondary | ICD-10-CM | POA: Diagnosis not present

## 2023-06-11 DIAGNOSIS — M1711 Unilateral primary osteoarthritis, right knee: Secondary | ICD-10-CM | POA: Diagnosis not present

## 2023-06-11 HISTORY — DX: Gastro-esophageal reflux disease without esophagitis: K21.9

## 2023-06-11 HISTORY — DX: Unspecified osteoarthritis, unspecified site: M19.90

## 2023-06-11 LAB — CBC
HCT: 41.7 % (ref 36.0–46.0)
Hemoglobin: 14 g/dL (ref 12.0–15.0)
MCH: 31.8 pg (ref 26.0–34.0)
MCHC: 33.6 g/dL (ref 30.0–36.0)
MCV: 94.8 fL (ref 80.0–100.0)
Platelets: 300 10*3/uL (ref 150–400)
RBC: 4.4 MIL/uL (ref 3.87–5.11)
RDW: 12.4 % (ref 11.5–15.5)
WBC: 6 10*3/uL (ref 4.0–10.5)
nRBC: 0 % (ref 0.0–0.2)

## 2023-06-11 LAB — BASIC METABOLIC PANEL WITH GFR
Anion gap: 5 (ref 5–15)
BUN: 10 mg/dL (ref 6–20)
CO2: 28 mmol/L (ref 22–32)
Calcium: 9.2 mg/dL (ref 8.9–10.3)
Chloride: 103 mmol/L (ref 98–111)
Creatinine, Ser: 0.61 mg/dL (ref 0.44–1.00)
GFR, Estimated: >60 mL/min (ref >60)
Glucose, Bld: 84 mg/dL (ref 70–99)
Potassium: 4 mmol/L (ref 3.5–5.1)
Sodium: 136 mmol/L (ref 135–145)

## 2023-06-11 LAB — SURGICAL PCR SCREEN
MRSA, PCR: NEGATIVE
Staphylococcus aureus: NEGATIVE

## 2023-06-14 ENCOUNTER — Encounter (HOSPITAL_COMMUNITY): Payer: Self-pay

## 2023-06-14 DIAGNOSIS — K219 Gastro-esophageal reflux disease without esophagitis: Secondary | ICD-10-CM | POA: Diagnosis not present

## 2023-06-14 DIAGNOSIS — E6609 Other obesity due to excess calories: Secondary | ICD-10-CM | POA: Diagnosis not present

## 2023-06-14 DIAGNOSIS — G43109 Migraine with aura, not intractable, without status migrainosus: Secondary | ICD-10-CM | POA: Diagnosis not present

## 2023-06-14 DIAGNOSIS — E66811 Obesity, class 1: Secondary | ICD-10-CM | POA: Diagnosis not present

## 2023-06-14 NOTE — Anesthesia Preprocedure Evaluation (Addendum)
 Anesthesia Evaluation  Patient identified by MRN, date of birth, ID band Patient awake    Reviewed: Allergy  & Precautions, NPO status , Patient's Chart, lab work & pertinent test results  History of Anesthesia Complications (+) PONV and history of anesthetic complications  Airway Mallampati: II  TM Distance: >3 FB     Dental no notable dental hx. (+) Dental Advisory Given   Pulmonary neg pulmonary ROS   Pulmonary exam normal breath sounds clear to auscultation       Cardiovascular Normal cardiovascular exam Rhythm:Regular Rate:Normal  Hx/o PFO S/P closure   Neuro/Psych  Headaches Right arm pain  Neuromuscular disease CVA, Residual Symptoms  negative psych ROS   GI/Hepatic Neg liver ROS,GERD  Medicated,,  Endo/Other  Obesity  Renal/GU negative Renal ROS  negative genitourinary   Musculoskeletal  (+) Arthritis , Osteoarthritis,  Fibromyalgia -OA right knee   Abdominal  (+) + obese  Peds  Hematology negative hematology ROS (+)   Anesthesia Other Findings   Reproductive/Obstetrics negative OB ROS                             Anesthesia Physical Anesthesia Plan  ASA: 2  Anesthesia Plan: Spinal   Post-op Pain Management: Regional block*, Minimal or no pain anticipated and Tylenol  PO (pre-op)*   Induction: Intravenous  PONV Risk Score and Plan: 4 or greater and Scopolamine  patch - Pre-op, Midazolam , Ondansetron , Propofol  infusion and Treatment may vary due to age or medical condition  Airway Management Planned: Natural Airway and Simple Face Mask  Additional Equipment: None  Intra-op Plan:   Post-operative Plan:   Informed Consent: I have reviewed the patients History and Physical, chart, labs and discussed the procedure including the risks, benefits and alternatives for the proposed anesthesia with the patient or authorized representative who has indicated his/her understanding and  acceptance.     Dental advisory given  Plan Discussed with: CRNA and Anesthesiologist  Anesthesia Plan Comments: (See PAT note from 5/9)        Anesthesia Quick Evaluation

## 2023-06-14 NOTE — Progress Notes (Signed)
 Case: [id number] Date/Time: 06/15/23 0715   Procedures:      ARTHROPLASTY, KNEE, TOTAL (Right: Knee)     REMOVAL, HARDWARE (Right)   Anesthesia type: Spinal   Diagnosis: Primary osteoarthritis of right knee [M17.11]   Pre-op diagnosis: RIGHT KNEE DEGENERATIVE JOINT DISEASE   Location: [facility] ROOM 06 / [facility]   Surgeons: Dayne Even, MD       DISCUSSION: Tonya Andrews is a 48 yo female who presents to PAT prior to surgery above. PMH of hx of CVA (2011), PFO s/p closure (2011), POTS, migraines, GERD, fibromyalgia.  Prior anesthesia complication includes PONV  Patient follows with Cardiology for hx of PFO discovered after a CVA in 2011. She underwent closure in 2011. Last seen by Cardiology on 02/11/23. She recently in 01/2023 had outpatient cardiac monitoring done, echo, and CTA coronaries due to reports of chest tightness and SOB.  Echo showed EF 45-50% with septal hypokinesis, stable amplatzer, no significant valve issues. Monitor showed HR range 64-173 (sinus), average 101 bpm, no significant arrhythmia. CT coronary showed no calcium, no plaque, no evidence of shunt around amplatzer. She was cleared for surgery in 3/13 telephone visit:  "Chart reviewed as part of pre-operative protocol coverage. Patient was contacted 04/15/2023 in reference to pre-operative risk assessment for pending surgery as outlined below.  Tonya Andrews was last seen on 02/11/23 by Dr. Veryl Gottron.  Since that day, Tonya Andrews has done well and can achieve >4 METS.   Therefore, based on ACC/AHA guidelines, the patient would be at acceptable risk for the planned procedure without further cardiovascular testing.    Recommend holding OTC fish oil one week prior to surgery, she is aware of this recommendation."   VS: BP (!) 115/90   Pulse 85   Temp 36.9 C (Oral)   Resp 16   Ht 5\' 5"  (1.651 m)   Wt 86.6 kg   SpO2 100%   BMI 31.78 kg/m   PROVIDERS: Olin Bertin, MD   LABS: Labs reviewed: Acceptable for  surgery. (all labs ordered are listed, but only abnormal results are displayed)  Labs Reviewed  SURGICAL PCR SCREEN  CBC  BASIC METABOLIC PANEL WITH GFR     IMAGES: CXR 06/11/23:  FINDINGS: The cardiomediastinal contours are normal. Atrial septal closure device in play the lungs are clear. Pulmonary vasculature is normal. No consolidation, pleural effusion, or pneumothorax. No acute osseous abnormalities are seen.   IMPRESSION: No active cardiopulmonary disease.    EKG 06/11/23:  Normal sinus rhythm, rate 87 Non-specific ST-t changes When compared with ECG of 11-Dec-2022 08:25, Interpretation limited secondary to artifact  CV:  CTA coronaries 02/04/23:   IMPRESSION: 1. Coronary calcium score of 0. This was 1st percentile for age-, sex, and race-matched controls.   2. Total plaque volume 0 mm3.   3. Normal coronary origin with right dominance.   4. Normal coronary arteries.  CAD RADS 0.   5. Amplatzer interatrial occlusion device is in place. There is no evidence of shunting across the closure device.  Echo 01/18/23:  IMPRESSIONS    1. Left ventricular ejection fraction, by estimation, is 45 to 50%. The left ventricle has mildly decreased function. The left ventricle demonstrates regional wall motion abnormalities. Septal hypokinesis. Left ventricular diastolic parameters are indeterminate.  2. Right ventricular systolic function is mildly reduced. The right ventricular size is normal. There is normal pulmonary artery systolic pressure.  3. S/p Amplatzer septal occluder device. No residual shunt seen.  4. The mitral valve  is normal in structure. Trivial mitral valve regurgitation. No evidence of mitral stenosis.  5. The aortic valve is tricuspid. Aortic valve regurgitation is trivial. No aortic stenosis is present.  6. The inferior vena cava is normal in size with greater than 50% respiratory variability, suggesting right atrial pressure of 3  mmHg.  Cardiac monitor 01/13/23:  Patch Wear Time:  10 days and 23 hours (2024-11-12T17:54:49-0500 to 2024-11-23T16:56:28-0500)   Patient had a min HR of 64 bpm, max HR of 173 bpm, and avg HR of 101 bpm. Predominant underlying rhythm was Sinus Rhythm. No VT, SVT, atrial fibrillation, high degree block, or pauses noted. Isolated atrial and ventricular ectopy was rare (<1%). There were 2 triggered events, one sinus, one sinus with a PVC. No high risk arrhythmias detected.    Past Medical History:  Diagnosis Date   Arthritis    Fibromyalgia    Gallstones    GERD (gastroesophageal reflux disease)    Migraine headache    Dr Margie Sheller   Nasal polyp    Nerve pain    PFO (patent foramen ovale) 04/2009   by bubble study/TEE, Dr Arlester Ladd, cardiology   PONV (postoperative nausea and vomiting)    Seasonal allergies    Stroke [facility]) 04/2009, 07/2009   Dr Janett Medin right arm weakness    Past Surgical History:  Procedure Laterality Date   CHOLECYSTECTOMY N/A 05/30/2019   Procedure: LAPAROSCOPIC CHOLECYSTECTOMY;  Surgeon: Aldean Hummingbird, MD;  Location: [facility];  Service: General;  Laterality: N/A;   KNEE SURGERY Right    x2   PATENT FORAMEN OVALE(PFO) CLOSURE  09/2009   transcath closure    WISDOM TOOTH EXTRACTION      MEDICATIONS:  albuterol  (VENTOLIN  HFA) 108 (90 Base) MCG/ACT inhaler   cetirizine (ZYRTEC) 10 MG tablet   Cyanocobalamin  (VITAMIN B-12) 5000 MCG TBDP   DULoxetine (CYMBALTA) 30 MG capsule   EMGALITY 120 MG/ML SOAJ   Ferrous Sulfate (IRON PO)   fluticasone  (FLONASE ) 50 MCG/ACT nasal spray   ibuprofen (ADVIL) 200 MG tablet   metoprolol  tartrate (LOPRESSOR ) 25 MG tablet   Multiple Vitamins-Minerals (MULTIVITAMIN WITH MINERALS) tablet   omeprazole (PRILOSEC) 20 MG capsule   Probiotic Product (PROBIOTIC PO)   No current facility-administered medications for this encounter.   Antoinette Kirschner [facility] Short Stay/Anesthesiology [facility] Phone [phone] 06/14/2023 8:55  AM

## 2023-06-15 ENCOUNTER — Encounter (HOSPITAL_COMMUNITY)
Admission: RE | Disposition: A | Discharge status: Home / Self Care | Payer: Self-pay | Source: Home / Self Care | Attending: Orthopaedic Surgery

## 2023-06-15 ENCOUNTER — Ambulatory Visit (HOSPITAL_COMMUNITY): Payer: Self-pay | Admitting: Anesthesiology

## 2023-06-15 ENCOUNTER — Encounter (HOSPITAL_COMMUNITY): Payer: Self-pay | Admitting: Orthopaedic Surgery

## 2023-06-15 ENCOUNTER — Ambulatory Visit (HOSPITAL_COMMUNITY)
Admission: RE | Admit: 2023-06-15 | Discharge: 2023-06-15 | Disposition: A | Discharge status: Home / Self Care | Attending: Orthopaedic Surgery | Admitting: Orthopaedic Surgery

## 2023-06-15 ENCOUNTER — Ambulatory Visit (HOSPITAL_COMMUNITY): Payer: Self-pay | Admitting: Medical

## 2023-06-15 DIAGNOSIS — G8918 Other acute postprocedural pain: Secondary | ICD-10-CM | POA: Diagnosis not present

## 2023-06-15 DIAGNOSIS — M797 Fibromyalgia: Secondary | ICD-10-CM | POA: Insufficient documentation

## 2023-06-15 DIAGNOSIS — K219 Gastro-esophageal reflux disease without esophagitis: Secondary | ICD-10-CM | POA: Insufficient documentation

## 2023-06-15 DIAGNOSIS — Z96651 Presence of right artificial knee joint: Secondary | ICD-10-CM | POA: Diagnosis not present

## 2023-06-15 DIAGNOSIS — Z8673 Personal history of transient ischemic attack (TIA), and cerebral infarction without residual deficits: Secondary | ICD-10-CM | POA: Insufficient documentation

## 2023-06-15 DIAGNOSIS — Z01818 Encounter for other preprocedural examination: Secondary | ICD-10-CM

## 2023-06-15 DIAGNOSIS — Z6831 Body mass index (BMI) 31.0-31.9, adult: Secondary | ICD-10-CM | POA: Diagnosis not present

## 2023-06-15 DIAGNOSIS — M1711 Unilateral primary osteoarthritis, right knee: Secondary | ICD-10-CM | POA: Diagnosis present

## 2023-06-15 DIAGNOSIS — E669 Obesity, unspecified: Secondary | ICD-10-CM | POA: Insufficient documentation

## 2023-06-15 HISTORY — PX: HARDWARE REMOVAL: SHX979

## 2023-06-15 HISTORY — PX: TOTAL KNEE ARTHROPLASTY: SHX125

## 2023-06-15 LAB — POCT PREGNANCY, URINE: Preg Test, Ur: NEGATIVE

## 2023-06-15 SURGERY — Surgical Case
Anesthesia: *Unknown

## 2023-06-15 SURGERY — ARTHROPLASTY, KNEE, TOTAL
Anesthesia: Spinal | Site: Knee | Laterality: Right

## 2023-06-15 MED ORDER — BUPIVACAINE IN DEXTROSE 0.75-8.25 % IT SOLN
INTRATHECAL | Status: DC | PRN
  Administered 2023-06-15: 1.7 mL via INTRATHECAL

## 2023-06-15 MED ORDER — LACTATED RINGERS IV SOLN: INTRAVENOUS | Status: DC

## 2023-06-15 MED ORDER — HYDROCODONE-ACETAMINOPHEN 5-325 MG PO TABS: 1.0000 | ORAL_TABLET | ORAL | Status: DC | PRN

## 2023-06-15 MED ORDER — DEXAMETHASONE SODIUM PHOSPHATE 10 MG/ML IJ SOLN
INTRAMUSCULAR | Status: DC | PRN
  Administered 2023-06-15: 88 mg via INTRAVENOUS

## 2023-06-15 MED ORDER — TRANEXAMIC ACID-NACL 1000-0.7 MG/100ML-% IV SOLN
1000.0000 mg | Freq: Once | INTRAVENOUS | Status: AC
  Administered 2023-06-15: 1000 mg via INTRAVENOUS

## 2023-06-15 MED ORDER — KETOROLAC TROMETHAMINE 15 MG/ML IJ SOLN
INTRAMUSCULAR | Status: AC
Start: 2023-06-15 — End: ?
  Filled 2023-06-15: qty 1

## 2023-06-15 MED ORDER — PHENYLEPHRINE HCL (PRESSORS) 10 MG/ML IV SOLN
INTRAVENOUS | Status: AC
  Filled 2023-06-15: qty 1

## 2023-06-15 MED ORDER — MORPHINE SULFATE (PF) 2 MG/ML IV SOLN: 0.5000 mg | INTRAVENOUS | Status: DC | PRN

## 2023-06-15 MED ORDER — ACETAMINOPHEN 500 MG PO TABS
1000.0000 mg | ORAL_TABLET | Freq: Once | ORAL | Status: AC
  Administered 2023-06-15: 1000 mg via ORAL

## 2023-06-15 MED ORDER — OXYCODONE HCL 5 MG PO TABS
ORAL_TABLET | ORAL | Status: AC
  Filled 2023-06-15: qty 1

## 2023-06-15 MED ORDER — OXYCODONE HCL 5 MG/5ML PO SOLN: 5.0000 mg | Freq: Once | ORAL | Status: AC | PRN

## 2023-06-15 MED ORDER — DROPERIDOL 2.5 MG/ML IJ SOLN: 0.6250 mg | Freq: Once | INTRAMUSCULAR | Status: DC | PRN

## 2023-06-15 MED ORDER — CHLORHEXIDINE GLUCONATE 0.12 % MT SOLN
15.0000 mL | Freq: Once | OROMUCOSAL | Status: AC
  Administered 2023-06-15: 15 mL via OROMUCOSAL

## 2023-06-15 MED ORDER — HYDROMORPHONE HCL 1 MG/ML IJ SOLN
INTRAMUSCULAR | Status: AC
  Filled 2023-06-15: qty 1

## 2023-06-15 MED ORDER — DEXAMETHASONE SODIUM PHOSPHATE 10 MG/ML IJ SOLN
INTRAMUSCULAR | Status: AC
  Filled 2023-06-15: qty 1

## 2023-06-15 MED ORDER — PHENYLEPHRINE HCL (PRESSORS) 10 MG/ML IV SOLN
INTRAVENOUS | Status: DC | PRN
  Administered 2023-06-15 (×2): 80 ug via INTRAVENOUS
  Administered 2023-06-15: 100 ug via INTRAVENOUS

## 2023-06-15 MED ORDER — PROPOFOL 10 MG/ML IV BOLUS
INTRAVENOUS | Status: DC | PRN
  Administered 2023-06-15 (×2): 10 mg via INTRAVENOUS

## 2023-06-15 MED ORDER — SODIUM CHLORIDE 0.9 % IV SOLN
INTRAVENOUS | Status: DC | PRN
  Administered 2023-06-15: 80 mL

## 2023-06-15 MED ORDER — LIDOCAINE HCL (CARDIAC) PF 100 MG/5ML IV SOSY
PREFILLED_SYRINGE | INTRAVENOUS | Status: DC | PRN
  Administered 2023-06-15: 30 mg via INTRAVENOUS

## 2023-06-15 MED ORDER — LACTATED RINGERS IV BOLUS
500.0000 mL | Freq: Once | INTRAVENOUS | Status: AC
  Administered 2023-06-15: 500 mL via INTRAVENOUS

## 2023-06-15 MED ORDER — PROPOFOL 500 MG/50ML IV EMUL
INTRAVENOUS | Status: DC | PRN
  Administered 2023-06-15: 50 ug/kg/min via INTRAVENOUS

## 2023-06-15 MED ORDER — METHOCARBAMOL 500 MG PO TABS
ORAL_TABLET | ORAL | Status: AC
  Filled 2023-06-15: qty 1

## 2023-06-15 MED ORDER — ONDANSETRON HCL 4 MG/2ML IJ SOLN
INTRAMUSCULAR | Status: AC
  Filled 2023-06-15: qty 2

## 2023-06-15 MED ORDER — HYDROCODONE-ACETAMINOPHEN 5-325 MG PO TABS: 1.0000 | ORAL_TABLET | Freq: Four times a day (QID) | ORAL | 0 refills | Status: DC | PRN

## 2023-06-15 MED ORDER — FENTANYL CITRATE (PF) 100 MCG/2ML IJ SOLN
INTRAMUSCULAR | Status: DC | PRN
  Administered 2023-06-15: 50 ug via INTRAVENOUS

## 2023-06-15 MED ORDER — TIZANIDINE HCL 4 MG PO TABS: 4.0000 mg | ORAL_TABLET | Freq: Four times a day (QID) | ORAL | 1 refills | Status: DC | PRN

## 2023-06-15 MED ORDER — KETOROLAC TROMETHAMINE 15 MG/ML IJ SOLN
15.0000 mg | Freq: Four times a day (QID) | INTRAMUSCULAR | Status: DC
  Administered 2023-06-15: 15 mg via INTRAVENOUS

## 2023-06-15 MED ORDER — LACTATED RINGERS IV BOLUS
250.0000 mL | Freq: Once | INTRAVENOUS | Status: AC
  Administered 2023-06-15: 250 mL via INTRAVENOUS

## 2023-06-15 MED ORDER — ONDANSETRON HCL 4 MG PO TABS: 4.0000 mg | ORAL_TABLET | Freq: Four times a day (QID) | ORAL | Status: DC | PRN

## 2023-06-15 MED ORDER — DEXMEDETOMIDINE HCL IN NACL 80 MCG/20ML IV SOLN
INTRAVENOUS | Status: DC | PRN
  Administered 2023-06-15: 8 ug via INTRAVENOUS

## 2023-06-15 MED ORDER — ACETAMINOPHEN 500 MG PO TABS: 500.0000 mg | ORAL_TABLET | Freq: Four times a day (QID) | ORAL | Status: DC

## 2023-06-15 MED ORDER — ORAL CARE MOUTH RINSE: 15.0000 mL | Freq: Once | OROMUCOSAL | Status: AC

## 2023-06-15 MED ORDER — TRANEXAMIC ACID-NACL 1000-0.7 MG/100ML-% IV SOLN
1000.0000 mg | INTRAVENOUS | Status: AC
  Administered 2023-06-15: 1000 mg via INTRAVENOUS
  Filled 2023-06-15: qty 100

## 2023-06-15 MED ORDER — CEFAZOLIN SODIUM-DEXTROSE 2-4 GM/100ML-% IV SOLN
2.0000 g | INTRAVENOUS | Status: AC
  Administered 2023-06-15: 2 g via INTRAVENOUS
  Filled 2023-06-15: qty 100

## 2023-06-15 MED ORDER — SODIUM CHLORIDE 0.9 % IR SOLN
Status: DC | PRN
  Administered 2023-06-15: 3000 mL

## 2023-06-15 MED ORDER — OXYCODONE HCL 5 MG PO TABS
5.0000 mg | ORAL_TABLET | Freq: Once | ORAL | Status: AC | PRN
  Administered 2023-06-15: 5 mg via ORAL

## 2023-06-15 MED ORDER — HYDROMORPHONE HCL 1 MG/ML IJ SOLN
0.2500 mg | INTRAMUSCULAR | Status: DC | PRN
  Administered 2023-06-15 (×4): 0.5 mg via INTRAVENOUS

## 2023-06-15 MED ORDER — ONDANSETRON HCL 4 MG/2ML IJ SOLN
INTRAMUSCULAR | Status: DC | PRN
  Administered 2023-06-15: 4 mg via INTRAVENOUS

## 2023-06-15 MED ORDER — PROMETHAZINE HCL 12.5 MG PO TABS: 12.5000 mg | ORAL_TABLET | Freq: Four times a day (QID) | ORAL | 0 refills | Status: DC | PRN

## 2023-06-15 MED ORDER — TRANEXAMIC ACID 1000 MG/10ML IV SOLN
2000.0000 mg | Freq: Once | INTRAVENOUS | Status: AC
  Administered 2023-06-15: 2000 mg via TOPICAL
  Filled 2023-06-15: qty 20

## 2023-06-15 MED ORDER — ACETAMINOPHEN 500 MG PO TABS
ORAL_TABLET | ORAL | Status: AC
  Filled 2023-06-15: qty 2

## 2023-06-15 MED ORDER — HYDROMORPHONE HCL 1 MG/ML IJ SOLN
0.5000 mg | INTRAMUSCULAR | Status: DC | PRN
  Administered 2023-06-15 (×2): 0.5 mg via INTRAVENOUS

## 2023-06-15 MED ORDER — PHENYLEPHRINE 80 MCG/ML (10ML) SYRINGE FOR IV PUSH (FOR BLOOD PRESSURE SUPPORT)
PREFILLED_SYRINGE | INTRAVENOUS | Status: AC
  Filled 2023-06-15: qty 10

## 2023-06-15 MED ORDER — METOCLOPRAMIDE HCL 5 MG/ML IJ SOLN: 5.0000 mg | Freq: Three times a day (TID) | INTRAMUSCULAR | Status: DC | PRN

## 2023-06-15 MED ORDER — FENTANYL CITRATE (PF) 250 MCG/5ML IJ SOLN
INTRAMUSCULAR | Status: AC
  Filled 2023-06-15: qty 5

## 2023-06-15 MED ORDER — BUPIVACAINE-EPINEPHRINE (PF) 0.5% -1:200000 IJ SOLN
INTRAMUSCULAR | Status: AC
  Filled 2023-06-15: qty 30

## 2023-06-15 MED ORDER — MIDAZOLAM HCL 2 MG/2ML IJ SOLN
INTRAMUSCULAR | Status: AC
  Filled 2023-06-15: qty 2

## 2023-06-15 MED ORDER — HYDROCODONE-ACETAMINOPHEN 7.5-325 MG PO TABS: 1.0000 | ORAL_TABLET | ORAL | Status: DC | PRN

## 2023-06-15 MED ORDER — METHOCARBAMOL 500 MG PO TABS
500.0000 mg | ORAL_TABLET | Freq: Four times a day (QID) | ORAL | Status: DC | PRN
  Administered 2023-06-15: 500 mg via ORAL

## 2023-06-15 MED ORDER — POVIDONE-IODINE 10 % EX SWAB: 2.0000 | Freq: Once | CUTANEOUS | Status: DC

## 2023-06-15 MED ORDER — ONDANSETRON HCL 4 MG/2ML IJ SOLN
4.0000 mg | Freq: Once | INTRAMUSCULAR | Status: AC | PRN
  Administered 2023-06-15: 4 mg via INTRAVENOUS

## 2023-06-15 MED ORDER — OXYCODONE HCL 5 MG PO TABS
5.0000 mg | ORAL_TABLET | Freq: Once | ORAL | Status: AC
  Administered 2023-06-15: 5 mg via ORAL

## 2023-06-15 MED ORDER — METOCLOPRAMIDE HCL 5 MG PO TABS: 5.0000 mg | ORAL_TABLET | Freq: Three times a day (TID) | ORAL | Status: DC | PRN

## 2023-06-15 MED ORDER — CEFAZOLIN SODIUM-DEXTROSE 2-4 GM/100ML-% IV SOLN
2.0000 g | Freq: Four times a day (QID) | INTRAVENOUS | Status: DC
  Administered 2023-06-15: 2 g via INTRAVENOUS

## 2023-06-15 MED ORDER — ONDANSETRON HCL 4 MG/2ML IJ SOLN
4.0000 mg | Freq: Four times a day (QID) | INTRAMUSCULAR | Status: DC | PRN
Start: 2023-06-15 — End: 2023-06-15

## 2023-06-15 MED ORDER — METHOCARBAMOL 1000 MG/10ML IJ SOLN: 500.0000 mg | Freq: Four times a day (QID) | INTRAMUSCULAR | Status: DC | PRN

## 2023-06-15 MED ORDER — PROPOFOL 1000 MG/100ML IV EMUL
INTRAVENOUS | Status: AC
Start: 2023-06-15 — End: ?
  Filled 2023-06-15: qty 100

## 2023-06-15 MED ORDER — BUPIVACAINE LIPOSOME 1.3 % IJ SUSP
INTRAMUSCULAR | Status: AC
  Filled 2023-06-15: qty 20

## 2023-06-15 MED ORDER — TRANEXAMIC ACID-NACL 1000-0.7 MG/100ML-% IV SOLN
INTRAVENOUS | Status: AC
  Filled 2023-06-15: qty 100

## 2023-06-15 MED ORDER — BUPIVACAINE LIPOSOME 1.3 % IJ SUSP: 20.0000 mL | Freq: Once | INTRAMUSCULAR | Status: DC

## 2023-06-15 MED ORDER — 0.9 % SODIUM CHLORIDE (POUR BTL) OPTIME
TOPICAL | Status: DC | PRN
  Administered 2023-06-15: 1000 mL

## 2023-06-15 MED ORDER — ROPIVACAINE HCL 5 MG/ML IJ SOLN
INTRAMUSCULAR | Status: DC | PRN
  Administered 2023-06-15: 30 mL via PERINEURAL

## 2023-06-15 MED ORDER — PHENYLEPHRINE 80 MCG/ML (10ML) SYRINGE FOR IV PUSH (FOR BLOOD PRESSURE SUPPORT)
PREFILLED_SYRINGE | INTRAVENOUS | Status: DC | PRN
  Administered 2023-06-15 (×2): 160 ug via INTRAVENOUS

## 2023-06-15 MED ORDER — CEFAZOLIN SODIUM-DEXTROSE 2-4 GM/100ML-% IV SOLN
INTRAVENOUS | Status: AC
  Filled 2023-06-15: qty 100

## 2023-06-15 MED ORDER — PROPOFOL 10 MG/ML IV BOLUS
INTRAVENOUS | Status: AC
  Filled 2023-06-15: qty 20

## 2023-06-15 MED ORDER — SODIUM CHLORIDE (PF) 0.9 % IJ SOLN
INTRAMUSCULAR | Status: AC
  Filled 2023-06-15: qty 10

## 2023-06-15 MED ORDER — MIDAZOLAM HCL 5 MG/5ML IJ SOLN
INTRAMUSCULAR | Status: DC | PRN
  Administered 2023-06-15: 1 mg via INTRAVENOUS
  Administered 2023-06-15: .5 mg via INTRAVENOUS

## 2023-06-15 MED ORDER — LIDOCAINE HCL (PF) 2 % IJ SOLN
INTRAMUSCULAR | Status: AC
  Filled 2023-06-15: qty 5

## 2023-06-15 SURGICAL SUPPLY — 57 items
ATTUNE PSFEM RTSZ6 NARCEM KNEE (Femur) IMPLANT
ATTUNE PSRP INSR SZ6 5 KNEE (Insert) IMPLANT
BAG COUNTER SPONGE SURGICOUNT (BAG) ×1 IMPLANT
BAG DECANTER FOR FLEXI CONT (MISCELLANEOUS) ×1 IMPLANT
BAG ZIPLOCK 12X15 (MISCELLANEOUS) ×1 IMPLANT
BASE TIBIAL ROT PLAT SZ 5 KNEE (Knees) IMPLANT
BLADE SAGITTAL 25.0X1.19X90 (BLADE) ×1 IMPLANT
BLADE SAW SGTL 11.0X1.19X90.0M (BLADE) ×1 IMPLANT
BLADE SURG SZ10 CARB STEEL (BLADE) ×1 IMPLANT
BNDG ELASTIC 6INX 5YD STR LF (GAUZE/BANDAGES/DRESSINGS) ×1 IMPLANT
BOOTIES KNEE HIGH SLOAN (MISCELLANEOUS) ×1 IMPLANT
BOWL SMART MIX CTS (DISPOSABLE) ×1 IMPLANT
CEMENT HV SMART SET (Cement) ×2 IMPLANT
COVER MAYO STAND STRL (DRAPES) ×1 IMPLANT
COVER SURGICAL LIGHT HANDLE (MISCELLANEOUS) ×1 IMPLANT
CUFF TRNQT CYL 34X4.125X (TOURNIQUET CUFF) ×1 IMPLANT
DRAPE C-ARM 42X120 X-RAY (DRAPES) ×1 IMPLANT
DRAPE EXTREMITY T 121X128X90 (DISPOSABLE) IMPLANT
DRAPE ORTHO 2.5IN SPLIT 77X108 (DRAPES) IMPLANT
DRAPE TOP 10253 STERILE (DRAPES) ×1 IMPLANT
DRAPE U-SHAPE 47X51 STRL (DRAPES) ×1 IMPLANT
DRSG AQUACEL AG ADV 3.5X10 (GAUZE/BANDAGES/DRESSINGS) ×1 IMPLANT
DURAPREP 26ML APPLICATOR (WOUND CARE) ×2 IMPLANT
ELECT PENCIL ROCKER SW 15FT (MISCELLANEOUS) ×1 IMPLANT
ELECT REM PT RETURN 15FT ADLT (MISCELLANEOUS) ×1 IMPLANT
GLOVE BIO SURGEON STRL SZ8 (GLOVE) ×2 IMPLANT
GLOVE BIO SURGEON STRL SZ8.5 (GLOVE) ×1 IMPLANT
GLOVE BIOGEL PI IND STRL 7.0 (GLOVE) ×1 IMPLANT
GLOVE BIOGEL PI IND STRL 8 (GLOVE) ×2 IMPLANT
GLOVE SURG SYN 7.0 (GLOVE) ×1 IMPLANT
GLOVE SURG SYN 7.0 PF PI (GLOVE) ×1 IMPLANT
GOWN SRG XL LVL 4 BRTHBL STRL (GOWNS) ×1 IMPLANT
GOWN STRL REUS W/ TWL XL LVL3 (GOWN DISPOSABLE) ×2 IMPLANT
HOLDER FOLEY CATH W/STRAP (MISCELLANEOUS) IMPLANT
HOOD PEEL AWAY T7 (MISCELLANEOUS) ×3 IMPLANT
KIT TURNOVER KIT A (KITS) IMPLANT
MANIFOLD NEPTUNE II (INSTRUMENTS) ×1 IMPLANT
NS IRRIG 1000ML POUR BTL (IV SOLUTION) ×1 IMPLANT
PACK GENERAL/GYN (CUSTOM PROCEDURE TRAY) ×1 IMPLANT
PACK TOTAL KNEE CUSTOM (KITS) ×1 IMPLANT
PAD ARMBOARD POSITIONER FOAM (MISCELLANEOUS) ×1 IMPLANT
PATELLA MEDIAL ATTUN 35MM KNEE (Knees) IMPLANT
PENCIL SMOKE EVACUATOR (MISCELLANEOUS) IMPLANT
PIN STEINMAN FIXATION KNEE (PIN) IMPLANT
PROTECTOR NERVE ULNAR (MISCELLANEOUS) ×1 IMPLANT
SET HNDPC FAN SPRY TIP SCT (DISPOSABLE) ×1 IMPLANT
SUT ETHIBOND NAB CT1 #1 30IN (SUTURE) ×1 IMPLANT
SUT MNCRL AB 3-0 PS2 18 (SUTURE) ×1 IMPLANT
SUT VIC AB 0 CT1 36 (SUTURE) ×2 IMPLANT
SUT VIC AB 1 CT1 36 (SUTURE) ×1 IMPLANT
SUT VIC AB 2-0 CT1 TAPERPNT 27 (SUTURE) ×1 IMPLANT
SUT VICRYL AB 3-0 FS1 BRD 27IN (SUTURE) ×1 IMPLANT
SUTURE STRATFX 0 PDS 27 VIOLET (SUTURE) ×1 IMPLANT
TRAY FOLEY MTR SLVR 16FR STAT (SET/KITS/TRAYS/PACK) IMPLANT
WATER STERILE IRR 1000ML POUR (IV SOLUTION) ×2 IMPLANT
WRAP KNEE MAXI GEL POST OP (GAUZE/BANDAGES/DRESSINGS) ×1 IMPLANT
YANKAUER SUCT BULB TIP NO VENT (SUCTIONS) ×1 IMPLANT

## 2023-06-15 NOTE — Anesthesia Procedure Notes (Signed)
 Anesthesia Regional Block: Adductor canal block   Pre-Anesthetic Checklist: , timeout performed,  Correct Patient, Correct Site, Correct Laterality,  Correct Procedure, Correct Position, site marked,  Risks and benefits discussed,  Surgical consent,  Pre-op evaluation,  At surgeon's request and post-op pain management  Laterality: Right  Prep: chloraprep       Needles:  Injection technique: Single-shot  Needle Type: Echogenic Stimulator Needle     Needle Length: 10cm  Needle Gauge: 21   Needle insertion depth: 7 cm   Additional Needles:   Procedures:,,,, ultrasound used (permanent image in chart),,    Narrative:  Start time: 06/15/2023 7:20 AM End time: 06/15/2023 7:25 AM Injection made incrementally with aspirations every 5 mL. Anesthesiologist: Tura Gaines, MD  Additional Notes: Timeout performed. Patient sedated. Relevant anatomy ID'd using US . Incremental 2-5ml injection of LA with frequent aspiration. Patient tolerated procedure well.

## 2023-06-15 NOTE — Transfer of Care (Signed)
 Immediate Anesthesia Transfer of Care Note  Patient: Tonya Andrews  Procedure(s) Performed: ARTHROPLASTY, KNEE, TOTAL (Right: Knee) REMOVAL, HARDWARE (Right: Knee)  Patient Location: PACU  Anesthesia Type:Spinal  Level of Consciousness: awake, alert , sedated, and patient cooperative  Airway & Oxygen Therapy: Patient Spontanous Breathing and Patient connected to face mask oxygen  Post-op Assessment: Report given to RN and Post -op Vital signs reviewed and stable  Post vital signs: Reviewed and stable  Last Vitals:  Vitals Value Taken Time  BP 109/81 06/15/23 0938  Temp    Pulse 75 06/15/23 0941  Resp 13 06/15/23 0941  SpO2 100 % 06/15/23 0941  Vitals shown include unfiled device data.  Last Pain:  Vitals:   06/15/23 0607  TempSrc: Oral  PainSc:          Complications: No notable events documented.

## 2023-06-15 NOTE — Interval H&P Note (Signed)
 History and Physical Interval Note:  06/15/2023 7:28 AM  Tonya Andrews  has presented today for surgery, with the diagnosis of RIGHT KNEE DEGENERATIVE JOINT DISEASE.  The various methods of treatment have been discussed with the patient and family. After consideration of risks, benefits and other options for treatment, the patient has consented to  Procedure(s): ARTHROPLASTY, KNEE, TOTAL (Right) REMOVAL, HARDWARE (Right) as a surgical intervention.  The patient's history has been reviewed, patient examined, no change in status, stable for surgery.  I have reviewed the patient's chart and labs.  Questions were answered to the patient's satisfaction.     Sahith Nurse G Valeria Krisko

## 2023-06-15 NOTE — Anesthesia Procedure Notes (Signed)
 Spinal  Patient location during procedure: OR Start time: 06/15/2023 7:40 AM End time: 06/15/2023 7:44 AM Reason for block: surgical anesthesia Staffing Performed: resident/CRNA  Resident/CRNA: Patt Boozer, CRNA Performed by: Patt Boozer, CRNA Authorized by: Tura Gaines, MD   Preanesthetic Checklist Completed: patient identified, IV checked, site marked, risks and benefits discussed, surgical consent, monitors and equipment checked, pre-op evaluation and timeout performed Spinal Block Patient position: sitting Prep: DuraPrep Patient monitoring: heart rate, continuous pulse ox and blood pressure Location: L3-4 Injection technique: single-shot Needle Needle type: Pencan  Needle gauge: 24 G Needle length: 9 cm Needle insertion depth: 4 cm Assessment Sensory level: T8 Events: CSF return Additional Notes

## 2023-06-15 NOTE — Op Note (Signed)
 PREOP DIAGNOSIS: DJD RIGHT KNEE POSTOP DIAGNOSIS: same PROCEDURE: RIGHT TKR ANESTHESIA: Spinal and MAC ATTENDING SURGEON: Alphonzo Ask ASSISTANT: Tonya Sachs PA  INDICATIONS FOR PROCEDURE: IJamie R Andrews is a 48 y.o. female who has struggled for a long time with pain due to degenerative arthritis of the right knee.  The patient has failed many conservative non-operative measures and at this point has pain which limits the ability to sleep and walk.  The patient is offered total knee replacement.  Informed operative consent was obtained after discussion of possible risks of anesthesia, infection, neurovascular injury, DVT, and death.  The importance of the post-operative rehabilitation protocol to optimize result was stressed extensively with the patient.  SUMMARY OF FINDINGS AND PROCEDURE:  Tonya Andrews was taken to the operative suite where under the above anesthesia a right knee replacement was performed.  There were advanced degenerative changes and the bone quality was excellent.  We used the DePuy Attune system and placed size 6 narrow femur, 5 tibia, 35 mm all polyethylene patella, and a size 5 mm spacer.  Tonya Sachs PA-C assisted throughout and was invaluable to the completion of the case in that he helped retract and maintain exposure while I placed components.  He also helped close thereby minimizing OR time.  We did remove the two ACL interference screws without difficulty. The patient was admitted for appropriate post-op care to include perioperative antibiotics and mechanical and pharmacologic measures for DVT prophylaxis.  DESCRIPTION OF PROCEDURE:  Tonya Andrews was taken to the operative suite where the above anesthesia was applied.  The patient was positioned supine and prepped and draped in normal sterile fashion.  An appropriate time out was performed.  After the administration of kefzol pre-op antibiotic the leg was elevated and exsanguinated and a tourniquet inflated. A standard  longitudinal incision was made on the anterior knee.  Dissection was carried down to the extensor mechanism.  All appropriate anti-infective measures were used including the pre-operative antibiotic, betadine impregnated drape, and closed hooded exhaust systems for each member of the surgical team.  A medial parapatellar incision was made in the extensor mechanism and the knee cap flipped and the knee flexed.  Some residual meniscal tissues were removed along with any remaining ACL/PCL tissue.  A guide was placed on the tibia and a flat cut was made on it's superior surface.  The interference screw in the tibia was seen and subsequently easily removed. An intramedullary guide was placed in the femur and was utilized to make anterior and posterior cuts creating an appropriate flexion gap.  A second intramedullary guide was placed in the femur to make a distal cut properly balancing the knee with an extension gap equal to the flexion gap.  The three bones sized to the above mentioned sizes and the appropriate guides were placed and utilized.  The femoral interference screw became visible and was easily removed. A trial reduction was done and the knee easily came to full extension and the patella tracked well on flexion.  The trial components were removed and all bones were cleaned with pulsatile lavage and then dried thoroughly.  Cement was mixed and was pressurized onto the bones followed by placement of the aforementioned components.  Excess cement was trimmed and pressure was held on the components until the cement had hardened.  The tourniquet was deflated and a small amount of bleeding was controlled with cautery and pressure.  The knee was irrigated thoroughly.  The extensor mechanism was  re-approximated with #1 ethibond in interrupted fashion.  The knee was flexed and the repair was solid.  The subcutaneous tissues were re-approximated with #0 and #2-0 vicryl and the skin closed with a subcuticular stitch and  steristrips.  A sterile dressing was applied.  Intraoperative fluids, EBL, and tourniquet time can be obtained from anesthesia records.  DISPOSITION:  The patient was taken to recovery room in stable condition and scheduled to potentially go home same day depending on ability to walk and tolerate liquids..  Tonya Andrews 06/15/2023, 9:15 AM

## 2023-06-15 NOTE — Progress Notes (Signed)
 Physical Therapy Evaluation Patient Details Name: Tonya Andrews MRN: [id number] DOB: [birth year] Today's Date: 06/15/2023  History of Present Illness  48 yo female presents to therapy s/p R TKA and removal of hardware on 06/15/2023 due to failure of conservative management. Pt PMH includes but is not limited to: GERD, fibromyalgia, CVA, and cholecystectomy.  Clinical Impression    Tonya Andrews is a 48 y.o. female POD 0 s/p R TKA. Patient reports IND with mobility at baseline. Patient is now limited by functional impairments (see PT problem list below) and requires CGA and cues for transfers and gait with RW. Patient was able to ambulate 45 and 20 feet with RW and CGA and cues for safe walker management. Patient educated on safe sequencing for stair mobility with L crutch and cues for co-contraction for R LE when in single limb stance, fall risk prevention, pain management, CP/Ice use, car transfers pt and spouse verbalized understanding of safe guarding position for people assisting with mobility. Patient instructed in exercises to facilitate ROM and circulation reviewed with HO provided. Patient will benefit from continued skilled PT interventions to address impairments and progress towards PLOF. Patient has met mobility goals at adequate level for discharge home with family support and OPPT services scheduled for 5/15; will continue to follow if pt continues acute stay to progress towards Mod I goals.       If plan is discharge home, recommend the following: A little help with walking and/or transfers;A little help with bathing/dressing/bathroom;Assistance with cooking/housework;Assist for transportation   Can travel by private vehicle        Equipment Recommendations Rolling walker (2 wheels)  Recommendations for Other Services       Functional Status Assessment Patient has had a recent decline in their functional status and demonstrates the ability to make significant improvements in  function in a reasonable and predictable amount of time.     Precautions / Restrictions Precautions Precautions: Knee;Fall Restrictions Weight Bearing Restrictions Per Provider Order: No      Mobility  Bed Mobility Overal bed mobility: Needs Assistance Bed Mobility: Supine to Sit     Supine to sit: Contact guard, HOB elevated     General bed mobility comments: min cues    Transfers Overall transfer level: Needs assistance Equipment used: Rolling walker (2 wheels) Transfers: Sit to/from Stand Sit to Stand: Contact guard assist           General transfer comment: min cues for AD and UE placement for bed, recliner and commode transfers    Ambulation/Gait Ambulation/Gait assistance: Contact guard assist Gait Distance (Feet): 45 Feet Assistive device: Rolling walker (2 wheels) Gait Pattern/deviations: Step-to pattern, Decreased stance time - right, Antalgic Gait velocity: decreased     General Gait Details: min cues for B UE support at RW to offload R LE in stance phase and proper placement of AD  Stairs Stairs: Yes Stairs assistance: Min assist Stair Management: No rails, With crutches Number of Stairs: 2 General stair comments: noted R LE instabiltiy with inital step navigation requring mod A to maintain balance with B HHA due to no handrails. spouse reported having crutch at home and trial with crutch on L and HHA on R from spouse with cues for co-contraction R LE when in single limb stance with much improved stability and safety as required in home setting  Wheelchair Mobility     Tilt Bed    Modified Rankin (Stroke Patients Only)       Balance  Overall balance assessment: Needs assistance Sitting-balance support: Feet supported Sitting balance-Leahy Scale: Fair     Standing balance support: Bilateral upper extremity supported, During functional activity, Reliant on assistive device for balance Standing balance-Leahy Scale: Fair Standing balance  comment: static standing no UE support                             Pertinent Vitals/Pain Pain Assessment Pain Assessment: 0-10 Pain Score: 4  Pain Location: R knee and LE (chronic LBP) Pain Descriptors / Indicators: Aching, Constant, Discomfort, Dull, Grimacing, Guarding Pain Intervention(s): Limited activity within patient's tolerance, Monitored during session, Premedicated before session, Repositioned, Ice applied    Home Living Family/patient expects to be discharged to:: Private residence Living Arrangements: Spouse/significant other Available Help at Discharge: Family Type of Home: House Home Access: Stairs to enter Entrance Stairs-Rails: None Entrance Stairs-Number of Steps: 4   Home Layout: Two level;Able to live on main level with bedroom/bathroom Home Equipment: Crutches      Prior Function Prior Level of Function : Independent/Modified Independent;Driving;Working/employed             Mobility Comments: ind wth all ADLs self  care tasks andi ADLs       Extremity/Trunk Assessment        Lower Extremity Assessment Lower Extremity Assessment: RLE deficits/detail RLE Deficits / Details: ankle DF/PF 5/5 RLE Sensation: WNL    Cervical / Trunk Assessment Cervical / Trunk Assessment: Normal  Communication   Communication Communication: No apparent difficulties    Cognition Arousal: Alert Behavior During Therapy: WFL for tasks assessed/performed   PT - Cognitive impairments: No apparent impairments                         Following commands: Intact       Cueing       General Comments      Exercises Total Joint Exercises Ankle Circles/Pumps: AROM, Both, 10 reps Quad Sets: AROM, Right, 5 reps Short Arc Quad: AROM, Right, 5 reps Heel Slides: AROM, Right, 5 reps Hip ABduction/ADduction: AROM, Right, 5 reps Straight Leg Raises: AROM, Right, 5 reps Knee Flexion: AROM, Right, 5 reps   Assessment/Plan    PT Assessment  Patient needs continued PT services  PT Problem List Decreased strength;Decreased activity tolerance;Decreased range of motion;Decreased balance;Decreased mobility;Pain       PT Treatment Interventions DME instruction;Gait training;Stair training;Functional mobility training;Therapeutic activities;Therapeutic exercise;Balance training;Neuromuscular re-education;Patient/family education;Modalities    PT Goals (Current goals can be found in the Care Plan section)  Acute Rehab PT Goals Patient Stated Goal: to return to work and go up the steps without pain PT Goal Formulation: With patient Time For Goal Achievement: 06/29/23 Potential to Achieve Goals: Good    Frequency 7X/week     Co-evaluation               AM-PAC PT "6 Clicks" Mobility  Outcome Measure Help needed turning from your back to your side while in a flat bed without using bedrails?: None Help needed moving from lying on your back to sitting on the side of a flat bed without using bedrails?: A Little Help needed moving to and from a bed to a chair (including a wheelchair)?: A Little Help needed standing up from a chair using your arms (e.g., wheelchair or bedside chair)?: A Little Help needed to walk in hospital room?: A Little Help needed climbing 3-5 steps with a  railing? : A Little 6 Click Score: 19    End of Session Equipment Utilized During Treatment: Gait belt Activity Tolerance: Patient tolerated treatment well Patient left: in chair;with call bell/phone within reach;with family/visitor present Nurse Communication: Mobility status;Other (comment) (pt readiness for d/c from PT standpoint) PT Visit Diagnosis: Unsteadiness on feet (R26.81);Other abnormalities of gait and mobility (R26.89);Muscle weakness (generalized) (M62.81);Difficulty in walking, not elsewhere classified (R26.2);Pain Pain - Right/Left: Right Pain - part of body: Knee;Leg    Time: 0981-1914 PT Time Calculation (min) (ACUTE ONLY): 46  min   Charges:   PT Evaluation $PT Eval Low Complexity: 1 Low PT Treatments $Gait Training: 8-22 mins $Therapeutic Exercise: 8-22 mins PT General Charges $$ ACUTE PT VISIT: 1 Visit         Cary Clarks, PT Acute Rehab   Annalee Kiang 06/15/2023, 5:17 PM

## 2023-06-16 ENCOUNTER — Encounter (HOSPITAL_COMMUNITY): Payer: Self-pay | Admitting: Orthopaedic Surgery

## 2023-06-16 NOTE — Anesthesia Postprocedure Evaluation (Signed)
 Anesthesia Post Note  Patient: Tonya Andrews  Procedure(s) Performed: ARTHROPLASTY, KNEE, TOTAL (Right: Knee) REMOVAL, HARDWARE (Right: Knee)     Patient location during evaluation: PACU Anesthesia Type: Spinal Level of consciousness: oriented and awake and alert Pain management: pain level controlled Vital Signs Assessment: post-procedure vital signs reviewed and stable Respiratory status: spontaneous breathing, respiratory function stable and patient connected to nasal cannula oxygen Cardiovascular status: blood pressure returned to baseline and stable Postop Assessment: no headache, no backache and no apparent nausea or vomiting Anesthetic complications: no   No notable events documented.  Last Vitals:  Vitals:   06/15/23 1600 06/15/23 1710  BP: (!) 117/92 112/76  Pulse: 84 82  Resp: (!) 196 18  Temp:  (!) 36.4 C  SpO2: 92% 93%    Last Pain:  Vitals:   06/15/23 1710  TempSrc:   PainSc: 4                  Giulio Bertino S

## 2023-06-18 DIAGNOSIS — R262 Difficulty in walking, not elsewhere classified: Secondary | ICD-10-CM | POA: Diagnosis not present

## 2023-06-18 DIAGNOSIS — Z96651 Presence of right artificial knee joint: Secondary | ICD-10-CM | POA: Diagnosis not present

## 2023-06-22 DIAGNOSIS — R262 Difficulty in walking, not elsewhere classified: Secondary | ICD-10-CM | POA: Diagnosis not present

## 2023-06-22 DIAGNOSIS — Z96651 Presence of right artificial knee joint: Secondary | ICD-10-CM | POA: Diagnosis not present

## 2023-06-24 ENCOUNTER — Other Ambulatory Visit: Payer: Self-pay | Admitting: Orthopedic Surgery

## 2023-06-24 ENCOUNTER — Ambulatory Visit (HOSPITAL_COMMUNITY)
Admission: RE | Admit: 2023-06-24 | Discharge: 2023-06-24 | Disposition: A | Discharge status: Home / Self Care | Source: Ambulatory Visit | Attending: Vascular Surgery | Admitting: Vascular Surgery

## 2023-06-24 DIAGNOSIS — M79604 Pain in right leg: Secondary | ICD-10-CM | POA: Diagnosis not present

## 2023-06-24 DIAGNOSIS — M25561 Pain in right knee: Secondary | ICD-10-CM | POA: Diagnosis not present

## 2023-06-24 DIAGNOSIS — Z96651 Presence of right artificial knee joint: Secondary | ICD-10-CM | POA: Diagnosis not present

## 2023-06-24 DIAGNOSIS — R262 Difficulty in walking, not elsewhere classified: Secondary | ICD-10-CM | POA: Diagnosis not present

## 2023-06-29 DIAGNOSIS — Z96651 Presence of right artificial knee joint: Secondary | ICD-10-CM | POA: Diagnosis not present

## 2023-06-29 DIAGNOSIS — R262 Difficulty in walking, not elsewhere classified: Secondary | ICD-10-CM | POA: Diagnosis not present

## 2023-07-01 DIAGNOSIS — R262 Difficulty in walking, not elsewhere classified: Secondary | ICD-10-CM | POA: Diagnosis not present

## 2023-07-01 DIAGNOSIS — Z96651 Presence of right artificial knee joint: Secondary | ICD-10-CM | POA: Diagnosis not present

## 2023-07-06 DIAGNOSIS — R262 Difficulty in walking, not elsewhere classified: Secondary | ICD-10-CM | POA: Diagnosis not present

## 2023-07-06 DIAGNOSIS — Z96651 Presence of right artificial knee joint: Secondary | ICD-10-CM | POA: Diagnosis not present

## 2023-07-08 DIAGNOSIS — R262 Difficulty in walking, not elsewhere classified: Secondary | ICD-10-CM | POA: Diagnosis not present

## 2023-07-08 DIAGNOSIS — Z96651 Presence of right artificial knee joint: Secondary | ICD-10-CM | POA: Diagnosis not present

## 2023-07-12 DIAGNOSIS — Z96651 Presence of right artificial knee joint: Secondary | ICD-10-CM | POA: Diagnosis not present

## 2023-07-12 DIAGNOSIS — R262 Difficulty in walking, not elsewhere classified: Secondary | ICD-10-CM | POA: Diagnosis not present

## 2023-07-14 DIAGNOSIS — Z96651 Presence of right artificial knee joint: Secondary | ICD-10-CM | POA: Diagnosis not present

## 2023-07-14 DIAGNOSIS — R262 Difficulty in walking, not elsewhere classified: Secondary | ICD-10-CM | POA: Diagnosis not present

## 2023-07-22 DIAGNOSIS — Z96651 Presence of right artificial knee joint: Secondary | ICD-10-CM | POA: Diagnosis not present

## 2023-07-22 DIAGNOSIS — R262 Difficulty in walking, not elsewhere classified: Secondary | ICD-10-CM | POA: Diagnosis not present

## 2023-08-03 DIAGNOSIS — Z1151 Encounter for screening for human papillomavirus (HPV): Secondary | ICD-10-CM | POA: Diagnosis not present

## 2023-08-03 DIAGNOSIS — Z124 Encounter for screening for malignant neoplasm of cervix: Secondary | ICD-10-CM | POA: Diagnosis not present

## 2023-08-03 DIAGNOSIS — Z01419 Encounter for gynecological examination (general) (routine) without abnormal findings: Secondary | ICD-10-CM | POA: Diagnosis not present

## 2023-08-03 DIAGNOSIS — Z1231 Encounter for screening mammogram for malignant neoplasm of breast: Secondary | ICD-10-CM | POA: Diagnosis not present

## 2023-08-03 DIAGNOSIS — Z6829 Body mass index (BMI) 29.0-29.9, adult: Secondary | ICD-10-CM | POA: Diagnosis not present

## 2023-08-04 DIAGNOSIS — M25561 Pain in right knee: Secondary | ICD-10-CM | POA: Diagnosis not present

## 2023-08-09 ENCOUNTER — Other Ambulatory Visit: Payer: Self-pay | Admitting: Obstetrics and Gynecology

## 2023-08-09 DIAGNOSIS — R928 Other abnormal and inconclusive findings on diagnostic imaging of breast: Secondary | ICD-10-CM

## 2023-08-11 DIAGNOSIS — G43719 Chronic migraine without aura, intractable, without status migrainosus: Secondary | ICD-10-CM | POA: Diagnosis not present

## 2023-08-13 ENCOUNTER — Other Ambulatory Visit: Payer: Self-pay | Admitting: Obstetrics and Gynecology

## 2023-08-13 ENCOUNTER — Ambulatory Visit
Admission: RE | Admit: 2023-08-13 | Discharge: 2023-08-13 | Disposition: A | Discharge status: Home / Self Care | Source: Ambulatory Visit | Attending: Obstetrics and Gynecology | Admitting: Obstetrics and Gynecology

## 2023-08-13 DIAGNOSIS — N6320 Unspecified lump in the left breast, unspecified quadrant: Secondary | ICD-10-CM

## 2023-08-13 DIAGNOSIS — R928 Other abnormal and inconclusive findings on diagnostic imaging of breast: Secondary | ICD-10-CM

## 2023-08-13 DIAGNOSIS — N6002 Solitary cyst of left breast: Secondary | ICD-10-CM

## 2023-08-13 DIAGNOSIS — N6322 Unspecified lump in the left breast, upper inner quadrant: Secondary | ICD-10-CM | POA: Diagnosis not present

## 2023-08-16 ENCOUNTER — Encounter

## 2023-08-16 ENCOUNTER — Other Ambulatory Visit

## 2023-08-17 DIAGNOSIS — K219 Gastro-esophageal reflux disease without esophagitis: Secondary | ICD-10-CM | POA: Diagnosis not present

## 2023-08-17 DIAGNOSIS — E66811 Obesity, class 1: Secondary | ICD-10-CM | POA: Diagnosis not present

## 2023-08-17 DIAGNOSIS — E6609 Other obesity due to excess calories: Secondary | ICD-10-CM | POA: Diagnosis not present

## 2023-08-17 DIAGNOSIS — G43109 Migraine with aura, not intractable, without status migrainosus: Secondary | ICD-10-CM | POA: Diagnosis not present

## 2023-08-18 DIAGNOSIS — R87615 Unsatisfactory cytologic smear of cervix: Secondary | ICD-10-CM | POA: Diagnosis not present

## 2023-08-19 ENCOUNTER — Ambulatory Visit
Admission: RE | Admit: 2023-08-19 | Discharge: 2023-08-19 | Disposition: A | Discharge status: Home / Self Care | Source: Ambulatory Visit | Attending: Obstetrics and Gynecology | Admitting: Obstetrics and Gynecology

## 2023-08-19 ENCOUNTER — Other Ambulatory Visit: Payer: Self-pay | Admitting: Obstetrics and Gynecology

## 2023-08-19 ENCOUNTER — Inpatient Hospital Stay
Admission: RE | Admit: 2023-08-19 | Discharge: 2023-08-19 | Discharge status: Home / Self Care | Source: Ambulatory Visit | Attending: Obstetrics and Gynecology | Admitting: Obstetrics and Gynecology

## 2023-08-19 DIAGNOSIS — N6320 Unspecified lump in the left breast, unspecified quadrant: Secondary | ICD-10-CM

## 2023-08-19 DIAGNOSIS — N6322 Unspecified lump in the left breast, upper inner quadrant: Secondary | ICD-10-CM

## 2023-08-19 DIAGNOSIS — N6002 Solitary cyst of left breast: Secondary | ICD-10-CM

## 2023-08-19 DIAGNOSIS — C50212 Malignant neoplasm of upper-inner quadrant of left female breast: Secondary | ICD-10-CM | POA: Diagnosis not present

## 2023-08-19 DIAGNOSIS — R928 Other abnormal and inconclusive findings on diagnostic imaging of breast: Secondary | ICD-10-CM

## 2023-08-19 HISTORY — PX: BREAST BIOPSY: SHX20

## 2023-08-20 LAB — SURGICAL PATHOLOGY

## 2023-08-26 DIAGNOSIS — C50912 Malignant neoplasm of unspecified site of left female breast: Secondary | ICD-10-CM | POA: Diagnosis not present

## 2023-08-26 NOTE — Progress Notes (Signed)
 REFERRING PHYSICIAN:  Latisha Truman CROME, MD PROVIDER:  VICENTA DASIE POLI, MD MRN: [id number] DOB: 1976/01/31 DATE OF ENCOUNTER: 08/26/2023 Subjective    Chief Complaint: NEW BREAST CANCER    History of Present Illness: Tonya Andrews is a 48 y.o. female who is seen today as an office consultation for evaluation of NEW BREAST CANCER    This is a 48 year old female who was found to have an abnormality on recent screening mammography of the left breast.  She underwent an ultrasound and further imaging showing a 6 mm x 4 mm x 5 mm mass at the 10 o'clock position of the left breast 7 cm from the nipple.  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  She has had no previous problems regarding her breast.  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  She just recently had a knee replacement.  On imaging, ultrasound of her axilla was negative. She has had no previous problems regarding her breast and there is no family history of breast cancer.    Review of Systems: A complete review of systems was obtained from the patient.  I have reviewed this information and discussed as appropriate with the patient.  See HPI as well for other ROS.  ROS   Medical History: Past Medical History:  Diagnosis Date  . History of cancer   . History of stroke     There is no problem list on file for this patient.   Past Surgical History:  Procedure Laterality Date  . CHOLECYSTECTOMY    . JOINT REPLACEMENT    . PFO closure       Allergies  Allergen Reactions  . Amoxicillin Nausea And Vomiting and Other (See Comments)    Does not ever want again. Made her feel bad.  . Latex Other (See Comments) and Rash    Unknown to patient, happened during surgery  . Calcium Carb-Vit D2-Mg-Bioflav Other (See Comments)  . Nifedipine Other (See Comments)  . Rho(D) Immune Globulin Other (See Comments)     Current Outpatient Medications on File Prior to Visit  Medication Sig Dispense Refill  . cetirizine (ZYRTEC) 10 mg capsule Take 10 mg by mouth once daily    . cholecalciferol 1000 unit tablet Take by mouth    . cyanocobalamin , vitamin B-12, 5,000 mcg TbDL Take 5,000 mcg by mouth once daily    . DULoxetine (CYMBALTA) 30 MG DR capsule 3 CAPSULES ORALLY TWICE A DAY 90 DAYS    . EMGALITY PEN 120 mg/mL PnIj ONE- 120 MG AUTOINJECTOR ONCE PER MONTH FOR MIGRAINE PREVENTION (3 AUTOINECTORS = 90 DAY SUPPLY)    . FERROUS SULFATE ORAL Take 1 tablet by mouth    . methocarbamoL  (ROBAXIN ) 750 MG tablet TAKE 1 TABLET BY MOUTH TWICE A DAY AS NEEDED FOR POST OP PAIN AND SPASM    . multivitamin tablet Take 1 tablet by mouth once daily    . multivitamin with minerals tablet Take 1 tablet by mouth    . omega-3 acid ethyl esters (LOVAZA) 1 gram capsule Take 2 g by mouth 2 (two) times daily    . pantoprazole (PROTONIX) 40 MG DR tablet Take 40 mg by mouth 2 (two) times daily    . tirzepatide (ZEPBOUND) 10 mg/0.5 mL pen injector Inject 10 mg subcutaneously every 7 (seven) days     No  current facility-administered medications on file prior to visit.    Family History  Problem Relation Age of Onset  . High blood pressure (Hypertension) Father   . Coronary Artery Disease (Blocked arteries around heart) Father      Social History   Tobacco Use  Smoking Status Never  Smokeless Tobacco Never     Social History   Socioeconomic History  . Marital status: Married  Tobacco Use  . Smoking status: Never  . Smokeless tobacco: Never  Substance and Sexual Activity  . Alcohol use: Never  . Drug use: Never   Social Drivers of Health   Housing Stability: Unknown (08/26/2023)   Housing Stability Vital Sign   . Homeless in the Last Year: No    Objective:   Vitals:   08/26/23 0946  BP: 118/80  Pulse: (!) 126  Temp: 36.6 C (97.9 F)  SpO2: 96%  Weight: 81.7 kg (180 lb 3.2 oz)  Height: 165.1 cm (5'  5)  PainSc:   2  PainLoc: Breast    Body mass index is 29.99 kg/m.  Physical Exam   A chaperone was present for the examination.  She appears well on exam.  There are no palpable breast masses.  The nipple areolar complex are normal in appearance.  There is no axillary adenopathy.  Labs, Imaging and Diagnostic Testing: I have reviewed her notes in the electronic medical records.  I have reviewed her ultrasound, mammograms, and pathology results  Assessment and Plan:     Diagnoses and all orders for this visit:  Invasive ductal carcinoma of left breast (CMS/HHS-HCC) -     Ambulatory Referral to Radiation Oncology -     Ambulatory Referral to Oncology-Medical -     Ambulatory Referral to Physical Therapy -     Ambulatory Referral to Plastic Surgery -     MRI breast bilateral with and without contrast; Future    We have discussed her in our multidisciplinary breast cancer conference.  I gave the patient and her husband a copy of the pathology results we discussed these in detail.  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  From a surgical standpoint we went over options including a radioactive seed guided left breast lumpectomy with sentinel node biopsy versus mastectomy and the long-term results of each.  We also discussed the need for Port-A-Cath placement at time of surgery for chemotherapy. We discussed that given her age and breast density as well as triple negative status that we will be ordering bilateral breast MRI.  She will also be referred to the cancer center to see medical and radiation oncology, genetics, and physical therapy.  We will also refer her to plastic surgeons as she is considering mastectomies. After a discussion with her and her husband, they understand the diagnosis and treatment options.  They will make a decision regarding surgery after the imaging and discussion with the other specialist.  Complex medical decision making    VICENTA DASIE POLI,  MD    I spent a total of 55 minutes in both face-to-face and non-face-to-face activities, excluding procedures performed, for this visit on the date of this encounter.

## 2023-08-30 ENCOUNTER — Ambulatory Visit: Attending: Surgery | Admitting: Rehabilitation

## 2023-08-30 ENCOUNTER — Telehealth: Payer: Self-pay | Admitting: Radiation Oncology

## 2023-08-30 DIAGNOSIS — C50912 Malignant neoplasm of unspecified site of left female breast: Secondary | ICD-10-CM | POA: Diagnosis not present

## 2023-08-30 DIAGNOSIS — R293 Abnormal posture: Secondary | ICD-10-CM | POA: Insufficient documentation

## 2023-08-30 DIAGNOSIS — M79601 Pain in right arm: Secondary | ICD-10-CM | POA: Diagnosis not present

## 2023-08-30 DIAGNOSIS — M79602 Pain in left arm: Secondary | ICD-10-CM | POA: Diagnosis not present

## 2023-08-30 NOTE — Telephone Encounter (Signed)
 Left message for patient to call back to schedule consult per 7/24 referral.

## 2023-08-30 NOTE — Therapy (Signed)
 OUTPATIENT PHYSICAL THERAPY BREAST CANCER BASELINE EVALUATION   Patient Name: Tonya Andrews MRN: [id number] DOB:1975-06-24, 48 y.o., female Today's Date: 08/30/2023  END OF SESSION:  PT End of Session - 08/30/23 1049     Visit Number 1    Number of Visits 2    Authorization Type auth needed    PT Start Time 1000    PT Stop Time 1038    PT Time Calculation (min) 38 min    Activity Tolerance Patient tolerated treatment well    Behavior During Therapy WFL for tasks assessed/performed          Past Medical History:  Diagnosis Date   Arthritis    Fibromyalgia    Gallstones    GERD (gastroesophageal reflux disease)    Migraine headache    Dr Oneita   Nasal polyp    Nerve pain    PFO (patent foramen ovale) 04/2009   by bubble study/TEE, Dr Wonda, cardiology   PONV (postoperative nausea and vomiting)    Seasonal allergies    Stroke (HCC) 04/2009, 07/2009   Dr Rosemarie right arm weakness   Past Surgical History:  Procedure Laterality Date   BREAST BIOPSY Left 08/19/2023   US  LT BREAST BX W LOC DEV 1ST LESION IMG BX SPEC US  GUIDE 08/19/2023 [facility] MAMMOGRAPHY   CHOLECYSTECTOMY N/A 05/30/2019   Procedure: LAPAROSCOPIC CHOLECYSTECTOMY;  Surgeon: Tanda Locus, MD;  Location: [facility];  Service: General;  Laterality: N/A;   HARDWARE REMOVAL Right 06/15/2023   Procedure: REMOVAL, HARDWARE;  Surgeon: Sheril Coy, MD;  Location: [facility];  Service: Orthopedics;  Laterality: Right;   KNEE SURGERY Right    x2   PATENT FORAMEN OVALE(PFO) CLOSURE  09/2009   transcath closure    TOTAL KNEE ARTHROPLASTY Right 06/15/2023   Procedure: ARTHROPLASTY, KNEE, TOTAL;  Surgeon: Sheril Coy, MD;  Location: [facility];  Service: Orthopedics;  Laterality: Right;   WISDOM TOOTH EXTRACTION     Patient Active Problem List   Diagnosis Date Noted   Primary localized osteoarthritis of right knee 06/15/2023   Gastrointestinal complaints 02/25/2022   Dietary counseling and surveillance 02/25/2022    Gastroesophageal reflux disease 02/25/2022   Other allergic rhinitis 02/25/2022   Reactive airway disease 02/25/2022   Excessive daytime sleepiness 09/17/2021   Cholelithiasis without obstruction 08/18/2021   Fibromyalgia 08/18/2021   Paresthesia of arm 08/18/2021   Twitching 08/18/2021   Vitamin B12 deficiency (non anemic) 08/18/2021   Cerebrovascular accident (HCC) 04/18/2020   PFO (patent foramen ovale) 05/03/2009   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 Vicenta Poli  REFERRING DIAG:  Diagnosis  C50.912 (ICD-10-CM) - Malignant neoplasm of unspecified site of left female breast   THERAPY DIAG:  Invasive ductal carcinoma of left breast (HCC)  Abnormal posture  Pain in left arm  Pain in right arm  Rationale for Evaluation and Treatment: Rehabilitation  ONSET DATE: 08/22/23  SUBJECTIVE:  SUBJECTIVE STATEMENT: Patient reports she is here today to be seen by her medical team for her newly diagnosed left breast cancer. She will be having a bilateral mastectomy.  Maybe with reconstruction.  MRI pending  PERTINENT HISTORY:  Triple negative left IDC.  Rt TKR 06/27/23.  Hx of 2 strokes with Rt sided weakness. Hx of hole in the heart.  Fibromyalgia.    PATIENT GOALS:   reduce lymphedema risk and learn post op HEP.   PAIN:  Are you having pain? Yes: NPRS scale: Rt knee and Rt shoulder   Pain location: 3-4/10 Pain description: sharp, dull, achy Aggravating factors: I know how to adjust to do daily activities  Relieving factors: mm relaxer , advil and tylenol     PRECAUTIONS: Active CA  RED FLAGS: None   HAND DOMINANCE: right  WEIGHT BEARING  RESTRICTIONS: No  FALLS:  Has patient fallen in last 6 months? 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    LEISURE: no  PRIOR LEVEL OF FUNCTION: Independent   OBJECTIVE: Note: Objective measures were completed at Evaluation unless otherwise noted.  COGNITION: Overall cognitive status: Within functional limits for tasks assessed    POSTURE:  Forward head and rounded shoulders posture  UPPER EXTREMITY AROM/PROM:  A/PROM RIGHT   eval   Shoulder extension 60  Shoulder flexion 164  Shoulder abduction 165  Shoulder internal rotation 70  Shoulder external rotation 80    (Blank rows = not tested)  A/PROM LEFT   eval  Shoulder extension 60  Shoulder flexion 163  Shoulder abduction 164  Shoulder internal rotation 70  Shoulder external rotation 81    (Blank rows = not tested)  CERVICAL AROM: All within normal limits:   UPPER EXTREMITY STRENGTH: 4/5 bil without pain   LYMPHEDEMA ASSESSMENTS (in cm):   LANDMARK RIGHT   eval  10 cm proximal to olecranon process 29.5  Olecranon process 25.5  10 cm proximal to ulnar styloid process 18.9  Just proximal to ulnar styloid process 15.7  Across hand at thumb web space 18.3  At base of 2nd digit 6.3  (Blank rows = not tested)  LANDMARK LEFT   eval  10 cm proximal to olecranon process 29.5  Olecranon process 26.8  10 cm proximal to ulnar styloid process 19.3  Just proximal to ulnar styloid process 15.8  Across hand at thumb web space 18.3  At base of 2nd digit 6.0  (Blank rows = not tested)  L-DEX LYMPHEDEMA SCREENING: The patient was assessed using the L-Dex machine today to produce a lymphedema index baseline score. The patient will be reassessed on a regular basis (typically every 3 months) to obtain new L-Dex scores. If the score is > 6.5 points away from his/her baseline score indicating onset of subclinical lymphedema,  it will be recommended to wear a compression garment for 4 weeks, 12 hours per day and then be reassessed. If the score continues to be > 6.5 points from baseline at reassessment, we will initiate lymphedema treatment. Assessing in this manner has a 95% rate of preventing clinically significant lymphedema.  QUICK DASH SURVEY: 18.81%  PATIENT EDUCATION:  Education details: Time spent educating patient on aspects of self-care to maximize post op recovery. Patient was educated on where and how to get a post op compression bra to use to reduce post op edema. Patient was also educated on the use of SOZO  screenings and surveillance principles for early identification of lymphedema onset. She was instructed to use the post op pillow in the axilla for pressure and pain relief. Patient educated on lymphedema risk reduction and post op shoulder/posture HEP. Person educated: Patient Education method: Explanation, Demonstration, Handout Education comprehension: Patient verbalized understanding and returned demonstration  HOME EXERCISE PROGRAM: Patient was instructed today in a home exercise program today for post op shoulder range of motion. These included active assist shoulder flexion in sitting, scapular retraction, wall walking with shoulder abduction, and hands behind head external rotation.  She was encouraged to do these twice a day, holding 3 seconds and repeating 5 times when permitted by her physician.   ASSESSMENT:  CLINICAL IMPRESSION: Pt has only yet met with her surgeon and does not have much information in the chart yet, but she reports she will be having a bil mastectomy with SLNB on the left with reconstruction unknown.  Pt will benefit from a post op PT reassessment to determine needs and from L-Dex screens every 3 months for 2 years to detect subclinical lymphedema.  Pt will benefit from skilled therapeutic intervention to improve on the following deficits: Decreased knowledge of  precautions, impaired UE functional use, pain, decreased ROM, postural dysfunction.   PT treatment/interventions: ADL/self-care home management, pt/family education, therapeutic exercise  REHAB POTENTIAL: Excellent  CLINICAL DECISION MAKING: Stable/uncomplicated  EVALUATION COMPLEXITY: Low   GOALS: Goals reviewed with patient? YES  LONG TERM GOALS: (STG=LTG)    Name Target Date Goal status  1 Pt will be able to verbalize understanding of pertinent lymphedema risk reduction practices relevant to her dx specifically related to skin care.  Baseline:  No knowledge 08/30/2023 Achieved at eval  2 Pt will be able to return demo and/or verbalize understanding of the post op HEP related to regaining shoulder ROM. Baseline:  No knowledge 08/30/2023 Achieved at eval  3 Pt will be able to verbalize understanding of the importance of viewing the post op After Breast CA Class video for further lymphedema risk reduction education and therapeutic exercise.  Baseline:  No knowledge 08/30/2023 Achieved at eval  4 Pt will demo she has regained full shoulder ROM and function post operatively compared to baselines.  Baseline: See objective measurements taken today. 11/30/23     PLAN:  PT FREQUENCY/DURATION: EVAL and 1 follow up appointment.   PLAN FOR NEXT SESSION: will reassess 3-4 weeks post op to determine needs.   Patient will follow up at outpatient cancer rehab 3-4 weeks following surgery.  If the patient requires physical therapy at that time, a specific plan will be dictated and sent to the referring physician for approval. The patient was educated today on appropriate basic range of motion exercises to begin post operatively and the importance of viewing the After Breast Cancer class video following surgery.  Patient was educated today on lymphedema risk reduction practices as it pertains to recommendations that will benefit the patient immediately following surgery.  She verbalized good  understanding.    Physical Therapy Information for After Breast Cancer Surgery/Treatment:  Lymphedema is a swelling condition that you may be at risk for in your arm if you have lymph nodes removed from the armpit area.  After a sentinel node biopsy, the risk is approximately 5-9% and is higher after an axillary node dissection.  There is treatment available for this condition and it is not life-threatening.  Contact your physician or physical therapist with concerns. You may begin the 4 shoulder/posture exercises (see  additional sheet) when permitted by your physician (typically a week after surgery).  If you have drains, you may need to wait until those are removed before beginning range of motion exercises.  A general recommendation is to not lift your arms above shoulder height until drains are removed.  These exercises should be done to your tolerance and gently.  This is not a no pain/no gain type of recovery so listen to your body and stretch into the range of motion that you can tolerate, stopping if you have pain.  If you are having immediate reconstruction, ask your plastic surgeon about doing exercises as he or she may want you to wait. We encourage you to view the After Breast Cancer class video following surgery.  You will learn information related to lymphedema risk, prevention and treatment and additional exercises to regain mobility following surgery.   While undergoing any medical procedure or treatment, try to avoid blood pressure being taken or needle sticks from occurring on the arm on the side of cancer.   This recommendation begins after surgery and continues for the rest of your life.  This may help reduce your risk of getting lymphedema (swelling in your arm). An excellent resource for those seeking information on lymphedema is the National Lymphedema Network's web site. It can be accessed at www.lymphnet.org If you notice swelling in your hand, arm or breast at any time following  surgery (even if it is many years from now), please contact your doctor or physical therapist to discuss this.  Lymphedema can be treated at any time but it is easier for you if it is treated early on.  If you feel like your shoulder motion is not returning to normal in a reasonable amount of time, please contact your surgeon or physical therapist.  [facility] [phone]. [address], [place] [address]  [facility] CLASS After Breast Cancer Class  After Breast Cancer Class is a specially designed exercise class video to assist you in a safe recover after having breast cancer surgery.  In this video you will learn how to get back to full function whether your drains were just removed or if you had surgery a month ago. The video can be viewed on this page: [email or web address].boyd-meyer.org/ or on YouTube here: [email or web address].  Class Goals  Understand specific stretches to improve the flexibility of you chest and shoulder. Learn ways to safely strengthen your upper body and improve your posture. Understand the warning signs of infection and why you may be at risk for an arm infection. Learn about Lymphedema and prevention.  ** You do not need to view this video until after surgery.  Drains should be removed to participate in the recommended exercises on the video.  Patient was instructed today in a home exercise program today for post op shoulder range of motion. These included active assist shoulder flexion in sitting, scapular retraction, wall walking with shoulder abduction, and hands behind head external rotation.  She was encouraged to do these twice a day, holding 3 seconds and repeating 5 times when permitted by her physician.    Larue Saddie SAUNDERS, PT 08/30/2023, 10:50 AM

## 2023-08-30 NOTE — Telephone Encounter (Signed)
 Patient returned voicemail regarding 7/24 referral. Patient declined to schedule RadOnc consult due to proceeding with surgery vs radiation treatments. Closed referral and advised patient to reach out to referring provider to be re-referred if she changed her mind.

## 2023-09-01 ENCOUNTER — Encounter: Payer: Self-pay | Admitting: *Deleted

## 2023-09-01 ENCOUNTER — Ambulatory Visit: Admitting: Physical Therapy

## 2023-09-01 ENCOUNTER — Telehealth: Payer: Self-pay | Admitting: *Deleted

## 2023-09-01 ENCOUNTER — Other Ambulatory Visit: Payer: Self-pay | Admitting: *Deleted

## 2023-09-01 ENCOUNTER — Other Ambulatory Visit: Payer: Self-pay | Admitting: Genetic Counselor

## 2023-09-01 ENCOUNTER — Ambulatory Visit (HOSPITAL_BASED_OUTPATIENT_CLINIC_OR_DEPARTMENT_OTHER): Admitting: Hematology and Oncology

## 2023-09-01 ENCOUNTER — Other Ambulatory Visit: Payer: Self-pay | Admitting: Surgery

## 2023-09-01 ENCOUNTER — Other Ambulatory Visit: Attending: Hematology and Oncology

## 2023-09-01 VITALS — BP 106/75 | HR 101 | Temp 98.0°F | Resp 18 | Ht 65.0 in | Wt 181.5 lb

## 2023-09-01 DIAGNOSIS — Z171 Estrogen receptor negative status [ER-]: Secondary | ICD-10-CM | POA: Insufficient documentation

## 2023-09-01 DIAGNOSIS — Z1731 Human epidermal growth factor receptor 2 positive status: Secondary | ICD-10-CM

## 2023-09-01 DIAGNOSIS — C50212 Malignant neoplasm of upper-inner quadrant of left female breast: Secondary | ICD-10-CM | POA: Diagnosis not present

## 2023-09-01 DIAGNOSIS — Z1722 Progesterone receptor negative status: Secondary | ICD-10-CM | POA: Diagnosis not present

## 2023-09-01 DIAGNOSIS — Z1732 Human epidermal growth factor receptor 2 negative status: Secondary | ICD-10-CM | POA: Diagnosis not present

## 2023-09-01 DIAGNOSIS — Z1721 Progesterone receptor positive status: Secondary | ICD-10-CM | POA: Insufficient documentation

## 2023-09-01 DIAGNOSIS — Z853 Personal history of malignant neoplasm of breast: Secondary | ICD-10-CM | POA: Diagnosis not present

## 2023-09-01 DIAGNOSIS — Z8042 Family history of malignant neoplasm of prostate: Secondary | ICD-10-CM | POA: Insufficient documentation

## 2023-09-01 DIAGNOSIS — C50912 Malignant neoplasm of unspecified site of left female breast: Secondary | ICD-10-CM

## 2023-09-01 LAB — GENETIC SCREENING ORDER

## 2023-09-01 NOTE — Progress Notes (Signed)
 Breast - No Medical Intervention - Off Treatment.  Patient Characteristics: Preoperative or Nonsurgical Candidate, M0 (Clinical Staging), Up to cT4c, Any N, M0, Surgery followed by Adjuvant Therapy Therapeutic Status: Preoperative or Nonsurgical Candidate, M0 (Clinical Staging) AJCC M Category: cM0 AJCC Grade: G2 ER Status: Negative (-) AJCC 8 Stage Grouping: IB HER2 Status: Negative (-) AJCC T Category: cT1b AJCC N Category: cN0 PR Status: Negative (-) Breast Surgical Plan: Surgery followed by Adjuvant Therapy (if necessary)

## 2023-09-01 NOTE — Progress Notes (Signed)
 [facility] CONSULT NOTE  Patient Care Team: Verena Mems, MD as PCP - General (Family Medicine) Lonni Slain, MD as PCP - Cardiology (Cardiology)  CHIEF COMPLAINTS/PURPOSE OF CONSULTATION:  Newly diagnosed breast cancer  HISTORY OF PRESENTING ILLNESS:   History of Present Illness Tonya Andrews is a 48 year old female who presents for consultation regarding invasive ductal carcinoma of the breast. She was referred by Dr. Vernetta for post-surgical consultation.  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 The cancer is grade 2 with a low KI-67 proliferation index.  Her medical history includes a stroke and PFO closure 14 years ago, influencing hormone treatment decisions. She also has fibromyalgia.  Family history includes cancer on her mother's side, with her maternal grandfather having died of cancer, possibly prostate cancer. No breast cancer history is noted. Genetic testing has not been performed.  She takes Zeppelin and follows a mostly vegetarian diet, low in protein. She has two daughters, aged 27 and 41, and a 43 year old son. No breast lumps or pain are present.     I reviewed her records extensively and collaborated the history with the patient.  SUMMARY OF ONCOLOGIC HISTORY: Oncology History   No history exists.     MEDICAL HISTORY:  Past Medical History:  Diagnosis Date   Arthritis    Fibromyalgia    Gallstones    GERD (gastroesophageal reflux disease)    Migraine headache    Dr Oneita   Nasal polyp    Nerve pain    PFO (patent foramen ovale) 04/2009   by bubble study/TEE, Dr Wonda, cardiology   PONV (postoperative nausea and vomiting)    Seasonal allergies    Stroke (HCC) 04/2009, 07/2009   Dr Rosemarie right arm weakness    SURGICAL HISTORY: Past Surgical History:  Procedure Laterality Date   BREAST BIOPSY Left 08/19/2023   US  LT BREAST BX W LOC DEV 1ST  LESION IMG BX SPEC US  GUIDE 08/19/2023 [facility] MAMMOGRAPHY   CHOLECYSTECTOMY N/A 05/30/2019   Procedure: LAPAROSCOPIC CHOLECYSTECTOMY;  Surgeon: Tanda Locus, MD;  Location: [facility];  Service: General;  Laterality: N/A;   HARDWARE REMOVAL Right 06/15/2023   Procedure: REMOVAL, HARDWARE;  Surgeon: Sheril Coy, MD;  Location: [facility];  Service: Orthopedics;  Laterality: Right;   KNEE SURGERY Right    x2   PATENT FORAMEN OVALE(PFO) CLOSURE  09/2009   transcath closure    TOTAL KNEE ARTHROPLASTY Right 06/15/2023   Procedure: ARTHROPLASTY, KNEE, TOTAL;  Surgeon: Sheril Coy, MD;  Location: [facility];  Service: Orthopedics;  Laterality: Right;   WISDOM TOOTH EXTRACTION      SOCIAL HISTORY: Social History   Socioeconomic History   Marital status: Married    Spouse name: Not on file   Number of children: 3   Years of education: masters   Highest education level: Not on file  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  Tobacco Use   Smoking status: Never    Passive exposure: Never   Smokeless tobacco: Never  Vaping Use   Vaping status: Never Used  Substance and Sexual Activity   Alcohol use: Never   Drug use: No   Sexual activity: Not on file    Comment: Vasectomy  Other Topics Concern   Not on file  Social History Narrative   Lives home with family   Caffeine- coffee, 1 daily   Social Drivers of Corporate investment banker  Strain: Not on file  Food Insecurity: No Food Insecurity (09/01/2023)   Hunger Vital Sign    Worried About Running Out of Food in the Last Year: Never true    Ran Out of Food in the Last Year: Never true  Transportation Needs: No Transportation Needs (09/01/2023)   PRAPARE - Administrator, Civil Service (Medical): No    Lack of Transportation (Non-Medical): No  Physical Activity: Not on file  Stress: Not on file  Social Connections: Unknown (03/02/2022)   Received [facility]   Social Network    Social Network: Not on file  Intimate  Partner Violence: Not At Risk (09/01/2023)   Humiliation, Afraid, Rape, and Kick questionnaire    Fear of Current or Ex-Partner: No    Emotionally Abused: No    Physically Abused: No    Sexually Abused: No    FAMILY HISTORY: Family History  Problem Relation Age of Onset   Hypertension Mother    Arthritis/Rheumatoid Father    Eczema Son    Asthma Son    Allergic rhinitis Son    Eczema Daughter    Asthma Daughter    Eczema Daughter    Asthma Daughter    Allergic rhinitis Daughter     ALLERGIES:  is allergic to amoxicillin, bio-35 gluten-free [actical], nifedipine, rho d immune globulin, and latex.  MEDICATIONS:  Current Outpatient Medications  Medication Sig Dispense Refill   albuterol  (VENTOLIN  HFA) 108 (90 Base) MCG/ACT inhaler Inhale 1-2 puffs into the lungs every 6 (six) hours as needed for wheezing or shortness of breath. 8.5 g 0   cetirizine (ZYRTEC) 10 MG tablet Take 10 mg by mouth daily.     Cyanocobalamin  (VITAMIN B-12) 5000 MCG TBDP Take 5,000 mcg by mouth daily.     DULoxetine (CYMBALTA) 30 MG capsule Take 30-60 mg by mouth See admin instructions. Take 30 mg in the morning and 60 mg at bedtime     EMGALITY 120 MG/ML SOAJ Inject 120 mg into the skin every 30 (thirty) days.     Ferrous Sulfate (IRON PO) Take 1 tablet by mouth daily. Gummy     fluticasone  (FLONASE ) 50 MCG/ACT nasal spray Place 2 sprays into both nostrils daily for 7 days. 16 g 2   ibuprofen (ADVIL) 200 MG tablet Take 400 mg by mouth daily.     metoprolol  tartrate (LOPRESSOR ) 25 MG tablet Take 1 tablet (25 mg total) by mouth 2 (two) times daily. 30 tablet 3   Multiple Vitamins-Minerals (MULTIVITAMIN WITH MINERALS) tablet Take 1 tablet by mouth daily. Gummy     omeprazole (PRILOSEC) 20 MG capsule Take 20 mg by mouth 2 (two) times daily before a meal. (Patient taking differently: Take 20 mg by mouth 2 (two) times daily before a meal. 1 time daily)     Probiotic Product (PROBIOTIC PO) Take 1 tablet by mouth  daily. 30 billion     methocarbamol  (ROBAXIN ) 750 MG tablet Take 750 mg by mouth.     No current facility-administered medications for this visit.    REVIEW OF SYSTEMS:   Constitutional: Denies fevers, chills or abnormal night sweats    All other systems were reviewed with the patient and are negative.  PHYSICAL EXAMINATION: ECOG PERFORMANCE STATUS: 1 - Symptomatic but completely ambulatory  Vitals:   09/01/23 1230  BP: 106/75  Pulse: (!) 101  Resp: 18  Temp: 98 F (36.7 C)  SpO2: 100%   Filed Weights   09/01/23 1230  Weight:  181 lb 8 oz (82.3 kg)    GENERAL:alert, no distress and comfortable    LABORATORY DATA:  I have reviewed the data as listed Lab Results  Component Value Date   WBC 6.0 06/11/2023   HGB 14.0 06/11/2023   HCT 41.7 06/11/2023   MCV 94.8 06/11/2023   PLT 300 06/11/2023   Lab Results  Component Value Date   NA 136 06/11/2023   K 4.0 06/11/2023   CL 103 06/11/2023   CO2 28 06/11/2023    RADIOGRAPHIC STUDIES: I have personally reviewed the radiological reports and agreed with the findings in the report.  ASSESSMENT AND PLAN:  Malignant neoplasm of upper-inner quadrant of left breast in female, estrogen receptor negative (HCC) 08/19/2023: Mammogram and ultrasound detected left breast mass 0.6 cm, biopsy: Grade 2 IDC ER 0%, PR 0%, Ki67 40%, HER2 1+ negative  Pathology and radiology counseling: Discussed with the patient, the details of pathology including the type of breast cancer,the clinical staging, the significance of ER, PR and HER-2/neu receptors and the implications for treatment. After reviewing the pathology in detail, we proceeded to discuss the different treatment options between surgery, radiation, chemotherapy, antiestrogen therapies.  Treatment plan: Bilateral mastectomies and reconstruction with sentinel lymph node biopsy Adjuvant chemotherapy with Taxotere and Cytoxan every 3 weeks x 4 Adjuvant radiation therapy  Chemotherapy  Counseling: I discussed the risks and benefits of chemotherapy including the risks of nausea/ vomiting, risk of infection from low WBC count, fatigue due to chemo or anemia, bruising or bleeding due to low platelets, mouth sores, loss/ change in taste and decreased appetite. Liver and kidney function will be monitored through out chemotherapy as abnormalities in liver and kidney function may be a side effect of treatment.  Neuropathy risk from Taxotere was discussed in detail. Risk of permanent bone marrow dysfunction and leukemia due to chemo were also discussed.  Will perform CT CAP and bone scan. I discussed with her that she would not need a breast MRI since she is planning on bilateral mastectomies.  Return to clinic after surgery to discuss final pathology report and confirm the treatment plan  Assessment & Plan Triple negative invasive ductal carcinoma of bilateral breasts, stage 1B Stage 1B triple negative invasive ductal carcinoma, 0.6 cm tumor, no lymph node involvement. Estrogen, progesterone, HER2 negative. Grade 2, moderate KI-67. Surgery primary treatment, bilateral mastectomy chosen. Chemotherapy indicated to reduce recurrence risk from 30% to 10%. Recommended TC regimen. Discussed chemotherapy side effects and DigniCap. Interested in genetic testing due to family history. - Perform bilateral mastectomy with sentinel lymph node surgery. - Administer TC chemotherapy regimen (Taxotere and Cytoxan) every three weeks for four cycles. - Discuss DigniCap for hair preservation during chemotherapy. - Order genetic testing for breast cancer-related genes. - Order MRI, CT of chest, abdomen, pelvis, and bone scan. - Install port for chemotherapy administration. - Monitor blood counts and administer Neulasta to prevent infection. - Provide antiemetics before chemotherapy. - Advise on dietary modifications to increase protein intake and reduce carbohydrates. - Monitor for neuropathy and provide  compression socks and gloves during chemotherapy. - Schedule follow-up after surgery to reassess treatment plan.  Fibromyalgia Fibromyalgia may be affected by chemotherapy. Potential improvement if immunological basis. No significant flare-ups expected. - Monitor fibromyalgia symptoms during chemotherapy.     All questions were answered. The patient knows to call the clinic with any problems, questions or concerns.    Viinay K Deserae Jennings, MD 09/01/23

## 2023-09-01 NOTE — Telephone Encounter (Signed)
 Error

## 2023-09-01 NOTE — Assessment & Plan Note (Signed)
 08/19/2023: Mammogram and ultrasound detected left breast mass 0.6 cm, biopsy: Grade 2 IDC ER 0%, PR 0%, Ki67 40%, HER2 1+ negative  Pathology and radiology counseling: Discussed with the patient, the details of pathology including the type of breast cancer,the clinical staging, the significance of ER, PR and HER-2/neu receptors and the implications for treatment. After reviewing the pathology in detail, we proceeded to discuss the different treatment options between surgery, radiation, chemotherapy, antiestrogen therapies.  Treatment plan: Breast conserving surgery with sentinel lymph node biopsy Adjuvant chemotherapy with Taxotere and Cytoxan every 3 weeks x 4 Adjuvant radiation therapy  Chemotherapy Counseling: I discussed the risks and benefits of chemotherapy including the risks of nausea/ vomiting, risk of infection from low WBC count, fatigue due to chemo or anemia, bruising or bleeding due to low platelets, mouth sores, loss/ change in taste and decreased appetite. Liver and kidney function will be monitored through out chemotherapy as abnormalities in liver and kidney function may be a side effect of treatment.  Neuropathy risk from Taxotere was discussed in detail. Risk of permanent bone marrow dysfunction and leukemia due to chemo were also discussed.  Return to clinic after surgery to discuss final pathology report and confirm the treatment plan

## 2023-09-02 ENCOUNTER — Encounter: Payer: Self-pay | Admitting: *Deleted

## 2023-09-02 ENCOUNTER — Encounter: Payer: Self-pay | Admitting: Genetic Counselor

## 2023-09-02 ENCOUNTER — Inpatient Hospital Stay (HOSPITAL_BASED_OUTPATIENT_CLINIC_OR_DEPARTMENT_OTHER): Admitting: Genetic Counselor

## 2023-09-02 DIAGNOSIS — C50212 Malignant neoplasm of upper-inner quadrant of left female breast: Secondary | ICD-10-CM | POA: Diagnosis not present

## 2023-09-02 DIAGNOSIS — Z171 Estrogen receptor negative status [ER-]: Secondary | ICD-10-CM | POA: Diagnosis not present

## 2023-09-02 DIAGNOSIS — Z8042 Family history of malignant neoplasm of prostate: Secondary | ICD-10-CM

## 2023-09-02 DIAGNOSIS — Z1721 Progesterone receptor positive status: Secondary | ICD-10-CM | POA: Diagnosis not present

## 2023-09-02 DIAGNOSIS — Z1732 Human epidermal growth factor receptor 2 negative status: Secondary | ICD-10-CM | POA: Diagnosis not present

## 2023-09-02 NOTE — Progress Notes (Signed)
 REFERRING PROVIDER: Odean Potts, MD  PRIMARY PROVIDER:  Verena Mems, MD  PRIMARY REASON FOR VISIT:  Encounter Diagnoses  Name Primary?   Malignant neoplasm of upper-inner quadrant of left breast in female, estrogen receptor negative (HCC) Yes   Family history of prostate cancer    I connected with Ms. Kelnhofer on 09/02/2023 at 9:00 EDT by telephone and verified that I am speaking with the correct person using two identifiers.   Patient location: [place], [place] Provider location: [place] [facility]  HISTORY OF PRESENT ILLNESS:   Ms. Warmoth, a 48 y.o. female, was seen for a [place] cancer genetics consultation at the request of Dr. Odean due to a personal and family history of cancer.  Ms. Skillin presents to clinic today to discuss the possibility of a hereditary predisposition to cancer, to discuss genetic testing, and to further clarify her future cancer risks, as well as potential cancer risks for family members.   In July 2025, at the age of 52, Ms. Ybarra was diagnosed with invasive ductal carcinoma of the left breast (ER/PR/HER2 negative- triple negative). The treatment plan is currently bilateral mastectomies but she would like results back STAT because she said she is still debating her surgical plan.    RISK FACTORS:  Menarche was at age 73.  First live birth at age 80.  OCP use for approximately 1 year.  Ovaries intact: yes.  Uterus intact: yes.  Menopausal status: premenopausal.  HRT use: 0 years. Colonoscopy: yes; one polyp- was told to return in 7 years Any excessive radiation exposure in the past: no  Past Medical History:  Diagnosis Date   Arthritis    Fibromyalgia    Gallstones    GERD (gastroesophageal reflux disease)    Migraine headache    Dr Oneita   Nasal polyp    Nerve pain    PFO (patent foramen ovale) 04/2009   by bubble study/TEE, Dr Wonda, cardiology   PONV (postoperative nausea and vomiting)    Seasonal allergies    Stroke (HCC)  04/2009, 07/2009   Dr Rosemarie right arm weakness    Past Surgical History:  Procedure Laterality Date   BREAST BIOPSY Left 08/19/2023   US  LT BREAST BX W LOC DEV 1ST LESION IMG BX SPEC US  GUIDE 08/19/2023 [facility] MAMMOGRAPHY   CHOLECYSTECTOMY N/A 05/30/2019   Procedure: LAPAROSCOPIC CHOLECYSTECTOMY;  Surgeon: Tanda Locus, MD;  Location: [facility];  Service: General;  Laterality: N/A;   HARDWARE REMOVAL Right 06/15/2023   Procedure: REMOVAL, HARDWARE;  Surgeon: Sheril Coy, MD;  Location: [facility];  Service: Orthopedics;  Laterality: Right;   KNEE SURGERY Right    x2   PATENT FORAMEN OVALE(PFO) CLOSURE  09/2009   transcath closure    TOTAL KNEE ARTHROPLASTY Right 06/15/2023   Procedure: ARTHROPLASTY, KNEE, TOTAL;  Surgeon: Sheril Coy, MD;  Location: [facility];  Service: Orthopedics;  Laterality: Right;   WISDOM TOOTH EXTRACTION      Social History   Socioeconomic History   Marital status: Married    Spouse name: Not on file   Number of children: 3   Years of education: masters   Highest education level: Not on file  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  Tobacco Use   Smoking status: Never    Passive exposure: Never   Smokeless tobacco: Never  Vaping Use   Vaping status: Never Used  Substance and Sexual Activity   Alcohol use: Never   Drug use: No   Sexual  activity: Not on file    Comment: Vasectomy  Other Topics Concern   Not on file  Social History Narrative   Lives home with family   Caffeine- coffee, 1 daily   Social Drivers of Health   Financial Resource Strain: Not on file  Food Insecurity: No Food Insecurity (09/01/2023)   Hunger Vital Sign    Worried About Running Out of Food in the Last Year: Never true    Ran Out of Food in the Last Year: Never true  Transportation Needs: No Transportation Needs (09/01/2023)   PRAPARE - Administrator, Civil Service (Medical): No    Lack of Transportation (Non-Medical): No  Physical Activity: Not on file   Stress: Not on file  Social Connections: Unknown (03/02/2022)   Received from [facility]   Social Network    Social Network: Not on file     FAMILY HISTORY:  We obtained a detailed, 4-generation family history.  Significant diagnoses are listed below: Family History  Problem Relation Age of Onset   Hypertension Mother    Arthritis/Rheumatoid Father    Prostate cancer Father 30   Lung cancer Maternal Uncle 50       smoked   Prostate cancer Maternal Grandfather 52 - 45       metastatic   Eczema Daughter    Asthma Daughter    Eczema Daughter    Asthma Daughter    Allergic rhinitis Daughter    Eczema Son    Asthma Son    Allergic rhinitis Son       Ms. Hollenback is unaware of previous family history of genetic testing for hereditary cancer risks. There is no reported Ashkenazi Jewish ancestry.   GENETIC COUNSELING ASSESSMENT: Ms. Pinkett is a 48 y.o. female with a personal and family history of cancer which is somewhat suggestive of a hereditary predisposition to cancer given her young age at diagnosis and triple negative breast cancer. We, therefore, discussed and recommended the following at today's visit.   DISCUSSION: We discussed that 5 - 10% of cancer is hereditary, with most cases of breast cancer associated with BRCA1/2.  There are other genes that can be associated with hereditary breast cancer syndromes.  We discussed that testing is beneficial for several reasons including knowing how to follow individuals after completing their treatment, identifying whether potential treatment options would be beneficial, and understanding if other family members could be at risk for cancer and allowing them to undergo genetic testing.   We reviewed the characteristics, features and inheritance patterns of hereditary cancer syndromes. We also discussed genetic testing, including the appropriate family members to test, the process of testing, insurance coverage and turn-around-time for  results. We discussed the implications of a negative, positive, carrier and/or variant of uncertain significant result. In order to get genetic test results in a timely manner so that Ms. Volland can use these genetic test results for surgical decisions, we recommended Ms. Filsaime pursue genetic testing for the BRCAplus panel. Once complete, we recommend Ms. Zirkelbach pursue reflex genetic testing to a more comprehensive gene panel.    Ms. Mattson elected to have [facility] CancerNext-Expanded Panel. The CancerNext-Expanded gene panel offered by [facility] and includes sequencing, rearrangement, and RNA analysis for the following 77 genes: AIP, ALK, APC, ATM, AXIN2, BAP1, BARD1, BMPR1A, BRCA1, BRCA2, BRIP1, CDC73, CDH1, CDK4, CDKN1B, CDKN2A, CEBPA, CHEK2, CTNNA1, DDX41, DICER1, ETV6, FH, FLCN, GATA2, LZTR1, MAX, MBD4, MEN1, MET, MLH1, MSH2, MSH3, MSH6, MUTYH, NF1, NF2, NTHL1,  PALB2, PHOX2B, PMS2, POT1, PRKAR1A, PTCH1, PTEN, RAD51C, RAD51D, RB1, RET, RPS20, RUNX1, SDHA, SDHAF2, SDHB, SDHC, SDHD, SMAD4, SMARCA4, SMARCB1, SMARCE1, STK11, SUFU, TMEM127, TP53, TSC1, TSC2, VHL, and WT1 (sequencing and deletion/duplication); EGFR, HOXB13, KIT, MITF, PDGFRA, POLD1, and POLE (sequencing only); EPCAM and GREM1 (deletion/duplication only).   Based on Ms. Lamagna's personal and family history of cancer, she meets medical criteria for genetic testing. Despite that she meets criteria, she may still have an out of pocket cost. We discussed that if her out of pocket cost for testing is over $100, the laboratory should contact them to discuss self-pay prices, patient pay assistance programs, if applicable, and other billing options.  PLAN: After considering the risks, benefits, and limitations, Ms. Coffie provided informed consent to pursue genetic testing and the blood sample was sent to [facility] for analysis of the CancerNext-Expanded Panel. Results should be available within approximately 2-3 weeks' time, at which point they  will be disclosed by telephone to Ms. Hook, as will any additional recommendations warranted by these results. Ms. Moronta will receive a summary of her genetic counseling visit and a copy of her results once available. This information will also be available in Epic.   Ms. Kozakiewicz questions were answered to her satisfaction today. Our contact information was provided should additional questions or concerns arise. Thank you for the referral and allowing us  to share in the care of your patient.   Otho Michalik, MS, [facility] Genetic Counselor [email or web address] (P) [phone]  40 minutes were spent on the date of the encounter in service to the patient including preparation, telephone consultation, documentation and care coordination. The patient was seen alone.  Drs. Gudena and/or Lanny were available to discuss this case as needed.   _______________________________________________________________________ For Office Staff:  Number of people involved in session: 1 Was an Intern/ student involved with case: no

## 2023-09-08 ENCOUNTER — Ambulatory Visit (INDEPENDENT_AMBULATORY_CARE_PROVIDER_SITE_OTHER)

## 2023-09-08 VITALS — BP 112/81 | HR 118 | Ht 65.0 in | Wt 178.0 lb

## 2023-09-08 DIAGNOSIS — C50912 Malignant neoplasm of unspecified site of left female breast: Secondary | ICD-10-CM | POA: Diagnosis not present

## 2023-09-08 DIAGNOSIS — Z171 Estrogen receptor negative status [ER-]: Secondary | ICD-10-CM | POA: Diagnosis not present

## 2023-09-08 DIAGNOSIS — C50919 Malignant neoplasm of unspecified site of unspecified female breast: Secondary | ICD-10-CM

## 2023-09-08 NOTE — Progress Notes (Signed)
 Plastic & Reconstructive Surgery New Patient Visit  Patient: Tonya Andrews MRN: [id number] Date: 09/08/2023 Surgical Oncologist: Dr. Vernetta  Reason for Consult: Breast Reconstruction   History of Present Illness:  This is a 48 y.o. woman who presents in consultation for breast reconstruction after breast cancer surgery.  Patient was found to have an abnormal screening mammogram of the left breast. Ultrasound showed a 6 mm x 4 mm x 5 mm mass at the 10 o'clock position of the left breast 7 cm from the nipple.   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   She has had no previous problems regarding her breast. Ultrasound of her axilla was negative.  She has had no previous problems regarding her breast and there is no family history of breast cancer.   Her breast history is as follows:  Oncology History   No history exists.    She recently met with Dr. Vernetta where they discussed breast cancer diagnosis and surgical options. She is hoping for bilateral mastectomies.   Recon Specific Factors: Family History of Breast/Ovarian Disease?: Denies Genetic testing: Triple negative breast cancer Prior XRT: No Need for adjuvant XRT? No Active Smoker?: No DVT Hx/Clotting Disorder/High Risk? No BMI: 29.62 Path: Reviewed  Caprini: 7 Recommend prophylaxis   Past Medical History: Past Medical History:  Diagnosis Date   Arthritis    Fibromyalgia    Gallstones    GERD (gastroesophageal reflux disease)    Migraine headache    Dr Oneita   Nasal polyp    Nerve pain    PFO (patent foramen ovale) 04/2009   by bubble study/TEE, Dr Wonda, cardiology   PONV (postoperative nausea and vomiting)    Seasonal allergies    Stroke (HCC) 04/2009, 07/2009   Dr Rosemarie right arm weakness    Past Surgical History: Past Surgical History:  Procedure Laterality Date   BREAST BIOPSY Left 08/19/2023   US  LT BREAST BX W LOC DEV 1ST  LESION IMG BX SPEC US  GUIDE 08/19/2023 [facility] MAMMOGRAPHY   CHOLECYSTECTOMY N/A 05/30/2019   Procedure: LAPAROSCOPIC CHOLECYSTECTOMY;  Surgeon: Tanda Locus, MD;  Location: [facility];  Service: General;  Laterality: N/A;   HARDWARE REMOVAL Right 06/15/2023   Procedure: REMOVAL, HARDWARE;  Surgeon: Sheril Coy, MD;  Location: [facility];  Service: Orthopedics;  Laterality: Right;   KNEE SURGERY Right    x2   PATENT FORAMEN OVALE(PFO) CLOSURE  09/2009   transcath closure    TOTAL KNEE ARTHROPLASTY Right 06/15/2023   Procedure: ARTHROPLASTY, KNEE, TOTAL;  Surgeon: Sheril Coy, MD;  Location: [facility];  Service: Orthopedics;  Laterality: Right;   WISDOM TOOTH EXTRACTION      Current Medications: Current Outpatient Medications on File Prior to Visit  Medication Sig Dispense Refill   albuterol  (VENTOLIN  HFA) 108 (90 Base) MCG/ACT inhaler Inhale 1-2 puffs into the lungs every 6 (six) hours as needed for wheezing or shortness of breath. 8.5 g 0   cetirizine (ZYRTEC) 10 MG tablet Take 10 mg by mouth daily.     Cyanocobalamin  (VITAMIN B-12) 5000 MCG TBDP Take 5,000 mcg by mouth daily.     DULoxetine (CYMBALTA) 30 MG capsule Take 30-60 mg by mouth See admin instructions. Take 30 mg in the morning and 60 mg at bedtime     EMGALITY 120 MG/ML SOAJ Inject 120 mg into the skin every 30 (thirty) days.     Ferrous Sulfate (IRON PO) Take 1 tablet by  mouth daily. Gummy     ibuprofen (ADVIL) 200 MG tablet Take 400 mg by mouth daily.     methocarbamol  (ROBAXIN ) 750 MG tablet Take 750 mg by mouth.     Multiple Vitamins-Minerals (MULTIVITAMIN WITH MINERALS) tablet Take 1 tablet by mouth daily. Gummy     omeprazole (PRILOSEC) 20 MG capsule Take 20 mg by mouth 2 (two) times daily before a meal. (Patient taking differently: Take 20 mg by mouth 2 (two) times daily before a meal. 1 time daily)     Probiotic Product (PROBIOTIC PO) Take 1 tablet by mouth daily. 30 billion     fluticasone  (FLONASE ) 50 MCG/ACT nasal spray  Place 2 sprays into both nostrils daily for 7 days. 16 g 2   metoprolol  tartrate (LOPRESSOR ) 25 MG tablet Take 1 tablet (25 mg total) by mouth 2 (two) times daily. 30 tablet 3   No current facility-administered medications on file prior to visit.    Allergies: Allergies  Allergen Reactions   Amoxicillin Nausea And Vomiting    Does not ever want again. Made her feel bad.     Bio-35 Gluten-Free [Actical]    Nifedipine Other (See Comments)   Rho D Immune Globulin Other (See Comments)   Latex Other (See Comments)    Unknown to patient, happened during surgery    Family History:  Family history is negative for breast cancer, bleeding/clotting disorders, problems with anesthesia, connective tissue disorders.   Social History:  Social History   Socioeconomic History   Marital status: Married    Spouse name: Not on file   Number of children: 3   Years of education: masters   Highest education level: Not on file  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  Tobacco Use   Smoking status: Never    Passive exposure: Never   Smokeless tobacco: Never  Vaping Use   Vaping status: Never Used  Substance and Sexual Activity   Alcohol use: Never   Drug use: No   Sexual activity: Not on file    Comment: Vasectomy  Other Topics Concern   Not on file  Social History Narrative   Lives home with family   Caffeine- coffee, 1 daily   Social Drivers of Corporate investment banker Strain: Not on file  Food Insecurity: No Food Insecurity (09/01/2023)   Hunger Vital Sign    Worried About Running Out of Food in the Last Year: Never true    Ran Out of Food in the Last Year: Never true  Transportation Needs: No Transportation Needs (09/01/2023)   PRAPARE - Administrator, Civil Service (Medical): No    Lack of Transportation (Non-Medical): No  Physical Activity: Not on file  Stress: Not on file  Social Connections: Unknown (03/02/2022)   Received from [facility]   Social  Network    Social Network: Not on file   Patient is married. She is a non smoker.   Review of systems: 10 point review of systems performed and negative except as noted in the HPI.  Physical Exam: BP 112/81 (BP Location: Left Arm, Patient Position: Sitting, Cuff Size: Normal)   Pulse (!) 118   Ht 5' 5 (1.651 m)   Wt 178 lb (80.7 kg)   SpO2 97%   BMI 29.62 kg/m  Body mass index is 29.62 kg/m. General: Well appearing, no apparent distress. Pulm: Breathing comfortably on room air without sounds/wheezing. CV: Regular rate. Good perfusion of extremities. Chest: Chest wall  without abnormality or obvious deformity or asymmetry.  Breast: Bilateral grade 2 ptosis. Breasts are overall symmetric with regards to shape and contour. Bilateral NAC viable and sensate. Papules everted without discharge. NACs positioned along breast meridian bilaterally. Skin quality is good. There are no skin lesions, striae, or dimpling. There are no prior incisions.  - Breast measurements:  Measurements  Right Breast (cm)  Left Breast (cm)   Base Width 14 14.5  SN - Nipple 28 27  Nipple - IMF 12 11  NAC diameter 6*5.5 4.5*6  Internipple Distance 22  Abdomen: Soft, nondistended, nontender to palpation. No palpable umbilical hernia.  5cm of diastasis appreciated. There are no prior incisions Skin quality is poor with striae from 3 previous pregnancies. There is sufficient skin and subcutaneous tissue for lateral reconstruction of breast to about b cup.  Neuro: Moving all four extremities spontaneously.  Psych: Appropriate mood and affect.   Labs: @LASTLABBYCOMP (HGBA1C,TSH)@  @LASTLABBYCOMP (WBC,RBC,HGB,HCT,MCV,MCH,MCHC,RDWSD,PLT,MPV)@  @LASTLABBYCOMP (NA,K,CL,CARDIOXIDE,ANIONGAP,GLUCOSE,BUN,CREATBLD,CALCIUM,BILITOTAL,ALBUMIN,PROTEINTOT,ALKPHOS,AST,ALT)@   @LASTLABBYCOMP (VITAMINARETI,VITAMINB1WHO,VITB2,PLASMAVITAMI,VITAMINB12LE,D25OHT,FERRITIN,FOLATELEVEL,PTH,TIBC,MAGNESIUM3,PHOSPHORSP,COPPER,ZINCLEVEL,PREALBUMIN)@  Imaging/Pathology:  Assessment: In summary, this is a pleasant 48 y.o. year-old woman with PMH as above and newly diagnosed left breast cancer presenting for consultation for breast reconstruction.  I had a long discussion with the patient regarding her options for breast reconstruction. All this information discussed is included in a patient education document given to the patient.   The patient was advised that timing of reconstruction can be either immediate (at the time of mastectomy), or delayed (at a separate operation after the mastectomy), and there are advantages and disadvantages to both, depending upon the need for additional cancer treatment. We discussed that breast reconstruction has limitations. We discussed that a reconstruction can have shape issues, be painful and is largely insensate. It is never the same as a natural breast. The general surgical risks discussed included wound infections, bleeding, scarring, chronic pain, fluid build up (seroma), and wound breakdown needing wound care, sometimes even a wound vac. Death can occur with any surgery. Sometimes a blood clot (DVT) can develop that may travel to the lungs (PE) and can be fatal. The mastectomy can have skin loss not infrequently and this can have negative implications for our reconstruction results both aesthetically and complications wise, especially increasing the risk of implant removal and the need for skin removal leading to poor aesthetics. Sometimes chronic pain syndromes can ensue that are difficult to manage. Aesthetic outcomes were discussed in detail and the patient understands that asymmetries are common, aesthetic disappointment is possible and that revision rates are very high after all types of reconstruction to try get the result as best as  possible. The patient was told that there are limitations of reconstruction, limited sometimes by the patient's characteristics and how they heal and that outcomes sometimes are unpredictable.  1. Implant based reconstruction:  The usual process of placing an expander and postoperative care was discussed. We also discussed the option of DTI (direct to implant) reconstruction. We discussed the potential use of "graft" material and the potential of a slight increase in infection. We contrasted this to the benefits of its use also. We compared and contrasted the different implants and the FDA concerns and recommendations. Specifically the patient understands that silicone implants are not implicated in autoimmune diseases but that there is a potential risk of a condition called ALCL, a lymphoma type condition that occurs particularly with the use of textures implants. The FDA and ASPS has not changed their stance on implants. Surgical risks besides the risks above, that were discussed, included the following,  infection leading to possible loss of the expander/implant, chronic pain, aesthetic complications such as rippling, asymmetry and animation (unwanted jumping or squashing of the implant when the chest muscle contracts). We also discussed the common complication of capsular contracture that often will need revisional surgery and can be painful. We quoted national data from our society of 40% revision rates at 7 years and that revisions in other two stage or DTI reconstruction was common. We discussed pre-pectoral reconstruction, placing the devices in front of the muscle, and that this is fairly new. We and others think it has many benefits including preventing the animation deformity and postoperative pain is much improved. The deleterious effects of radiation were discussed and that in this scenario we would only consider expanders, not direct to implant and not a flap initially. It is possible to do  reconstruction but the patient understands that this has a high rate of complications.  After radiation in delayed reconstruction scenarios, a flap such as a DIEP or a back (LDMF) flap are most often needed. The risks associated with implant based breast reconstruction were discussed including: infection, seroma, hematoma, skin flap/nipple necrosis, change/loss of breast or nipple sensation, implant failure, silicone concerns, capsular contracture, Anaplastic large cell lymphoma, asymmetry, the need for secondary and tertiary procedures, donor site morbidity, scars, need for possible drains and postoperative antibiotics, fat necrosis, pain, reconstruction failure, DVT/PE, stroke and heart attack.   2. DIEP Currently we only offer unilateral DIEPs and this was explained to the patient, so for the time being that would not be an option. Autologous tissue reconstruction with a DIEP (tummy fat and skin) flap was described and discussed in detail. For patients with adequate fatty tissue of the lower abdomen this is a good option. The usual postoperative recovery of 6-8 weeks and sometimes longer was discussed and the usual postoperative course was discussed.  Skin loss from the mastectomy, which is not uncommon, can affect our reconstruction efforts negatively by forcing us  to use a much bigger skin island, having higher risks of infection and skin breakdown for example and even compromising the flap survival. Risks associated with autologous based breast reconstruction were discussed including: bleeding, infection, seroma, scarring, vessel thrombosis, partial or total flap loss, asymmetry, need for secondary and tertiary procedures, fat necrosis, DVT/PE, stroke and heart attack. During surgery, the mastectomy flaps are interrogated to determine their vascularity and perfusion status. Should the mastectomy flaps show sign of compromise, an intraoperative decision may be made to delay reconstruction. Risks discussed  over and above the usual risks discussed included but were not limited to complete flap failure and the need for another type of reconstruction, chronic pain, abdominal bulge or hernia, scarring, skin breakdown, seroma (fluid build up); all sometimes needing further intervention including surgery. Hard areas (fat necrosis) can develop which are only dealt with if they are noticeable or painful. Wound complications are common and very common in ladies that have a higher BMI. I discussed this risk as being almost 100% but that the flap is still a good option given that implants have high risks also in these patients. Bleeding, infection and blood clots that could travel to the lungs are possible and walking after surgery is very important to help prevent these clots. Asymmetries, irregularities, size issues, projection issues and other shape concerns can arise and often do, that sometimes are amenable to revisions but sometimes are just a limitation of the outcome. We discussed that symmetry procedures are often necessary and are part of  the reconstructive process.   3. The Latissimus Dorsi flap (Back Muscle Flap) with an expander or without an expander and subsequent fat grafting procedures was discussed. The patient understands the procedure, postop course and use of the different types of implants. The risk, discussed are similar to the other types of implant, expander based reconstruction except that seroma (fluid build up) risk of 18% was highlighted. This sometimes can require further surgery. Sometimes there is a risk of decreased shoulder mobility and chronic pain is possible but this is usually well tolerated form of reconstruction.   4. LICAP/Goldilocks We discussed breast reconstruction with local tissue. Specifically, The Goldilock's procedure with possible LICAP flaps for additional volume. We discussed the limitations of this technique including that the viability of LICAP flaps could be compromised  by the mastectomy itself. We discussed the possibility of revision operations with fat grafting and/or implants in the future if she desires more volume. I will plan to doppler out the LICAP perforators preoperatively if possible to avoid injury during mastectomy.   5. We also discussed the [facility] Health Act of 1998. All the questions were answered. The patient has all the information to make an informed decision and has a good understanding of risks, benefits and limitations.  She has all the prerequisite information to make an informed decision.   6. Opioids: With regard to opioid prescriptions, the new [place] laws have been discussed, consent has been obtained, the patient has been thoroughly evaluated and CSMD is checked. The ICD-10 code is documented in the chart and will be placed on any prescriptions. We often use a multi modal approach to pain management. If further unanticipated prescribing of opioids is needed, the patient will need to visit her primary doctor or a pain specialist.      In summary, this is a very pleasant 48 y.o. F with newly diagnosed left breast cancer who presents to discuss breast reconstruction in anticipation of bilateral mastectomies with Dr. Vernetta. She will not require adjuvant radiation therapy.    We have agreed upon staged reconstruction with the first stage being immediate bilateral tissue expander placement with ADM. I will place in prepectoral plane if the soft tissue supports it. Otherwise I will place subpectorally or not at all. The incision pattern to be discussed with Dr. Vernetta, but patient does not want to preserve the nipples. This will give the patient time to think about further reconstructive options down the line once the cancer is addressed.    The time documented represents the total time spent on the day of the encounter in preparing for and completing the visit. It does not include time spent by ancillary staff, a resident, a fellow, another  trainee, or, for shared visits, time spent jointly with the patient or discussing the case or the performance of other separately performed services.   Time spent: 45 minutes.    Olson Lucarelli, MD [facility]   09/08/2023 11:29 AM

## 2023-09-09 ENCOUNTER — Other Ambulatory Visit: Payer: Self-pay | Admitting: Hematology and Oncology

## 2023-09-09 ENCOUNTER — Ambulatory Visit (HOSPITAL_COMMUNITY)
Admission: RE | Admit: 2023-09-09 | Discharge: 2023-09-09 | Disposition: A | Discharge status: Home / Self Care | Source: Ambulatory Visit | Attending: Hematology and Oncology | Admitting: Hematology and Oncology

## 2023-09-09 ENCOUNTER — Encounter (HOSPITAL_COMMUNITY)
Admission: RE | Admit: 2023-09-09 | Discharge: 2023-09-09 | Disposition: A | Discharge status: Home / Self Care | Source: Ambulatory Visit | Attending: Hematology and Oncology | Admitting: Hematology and Oncology

## 2023-09-09 DIAGNOSIS — K760 Fatty (change of) liver, not elsewhere classified: Secondary | ICD-10-CM | POA: Diagnosis not present

## 2023-09-09 DIAGNOSIS — Z96651 Presence of right artificial knee joint: Secondary | ICD-10-CM | POA: Diagnosis not present

## 2023-09-09 DIAGNOSIS — Z171 Estrogen receptor negative status [ER-]: Secondary | ICD-10-CM

## 2023-09-09 DIAGNOSIS — N200 Calculus of kidney: Secondary | ICD-10-CM | POA: Diagnosis not present

## 2023-09-09 DIAGNOSIS — C50212 Malignant neoplasm of upper-inner quadrant of left female breast: Secondary | ICD-10-CM | POA: Diagnosis not present

## 2023-09-09 DIAGNOSIS — Z9049 Acquired absence of other specified parts of digestive tract: Secondary | ICD-10-CM | POA: Diagnosis not present

## 2023-09-09 DIAGNOSIS — C50919 Malignant neoplasm of unspecified site of unspecified female breast: Secondary | ICD-10-CM | POA: Diagnosis not present

## 2023-09-09 DIAGNOSIS — C50912 Malignant neoplasm of unspecified site of left female breast: Secondary | ICD-10-CM | POA: Diagnosis not present

## 2023-09-09 MED ORDER — TECHNETIUM TC 99M MEDRONATE IV KIT
20.4000 | PACK | Freq: Once | INTRAVENOUS | Status: AC | PRN
  Administered 2023-09-09: 20.4 via INTRAVENOUS

## 2023-09-09 MED ORDER — IOHEXOL 300 MG/ML  SOLN
100.0000 mL | Freq: Once | INTRAMUSCULAR | Status: AC | PRN
  Administered 2023-09-09: 100 mL via INTRAVENOUS

## 2023-09-13 ENCOUNTER — Encounter: Payer: Self-pay | Admitting: *Deleted

## 2023-09-14 ENCOUNTER — Telehealth: Payer: Self-pay | Admitting: Hematology and Oncology

## 2023-09-14 ENCOUNTER — Encounter: Payer: Self-pay | Admitting: *Deleted

## 2023-09-14 ENCOUNTER — Telehealth: Payer: Self-pay | Admitting: *Deleted

## 2023-09-14 ENCOUNTER — Other Ambulatory Visit: Payer: Self-pay | Admitting: Surgery

## 2023-09-14 NOTE — Telephone Encounter (Signed)
 I left a voicemail for the patient that the CT chest abdomen pelvis is normal.  We are waiting the results of her bone scan.

## 2023-09-14 NOTE — Telephone Encounter (Signed)
 Called reading room for CT/bone scan to be read.

## 2023-09-17 ENCOUNTER — Encounter: Payer: Self-pay | Admitting: *Deleted

## 2023-09-17 ENCOUNTER — Ambulatory Visit

## 2023-09-17 ENCOUNTER — Encounter: Payer: Self-pay | Admitting: Genetic Counselor

## 2023-09-17 ENCOUNTER — Telehealth: Payer: Self-pay | Admitting: Genetic Counselor

## 2023-09-17 DIAGNOSIS — C50912 Malignant neoplasm of unspecified site of left female breast: Secondary | ICD-10-CM

## 2023-09-17 DIAGNOSIS — Z1379 Encounter for other screening for genetic and chromosomal anomalies: Secondary | ICD-10-CM | POA: Insufficient documentation

## 2023-09-17 DIAGNOSIS — Z515 Encounter for palliative care: Secondary | ICD-10-CM

## 2023-09-17 MED ORDER — ACETAMINOPHEN 500 MG PO TABS: 500.0000 mg | ORAL_TABLET | Freq: Four times a day (QID) | ORAL | 0 refills | Status: DC | PRN

## 2023-09-17 MED ORDER — HYDROMORPHONE HCL 2 MG PO TABS: 2.0000 mg | ORAL_TABLET | ORAL | 0 refills | Status: AC | PRN

## 2023-09-17 MED ORDER — METHOCARBAMOL 750 MG PO TABS: 750.0000 mg | ORAL_TABLET | Freq: Four times a day (QID) | ORAL | 0 refills | Status: AC | PRN

## 2023-09-17 MED ORDER — GABAPENTIN 300 MG PO CAPS: 300.0000 mg | ORAL_CAPSULE | Freq: Two times a day (BID) | ORAL | 0 refills | Status: DC

## 2023-09-17 MED ORDER — IBUPROFEN 600 MG PO TABS: 600.0000 mg | ORAL_TABLET | Freq: Three times a day (TID) | ORAL | 0 refills | Status: AC | PRN

## 2023-09-17 NOTE — Telephone Encounter (Signed)
 I contacted Tonya Andrews to discuss her genetic testing results. No pathogenic variants were identified in the 77 genes analyzed. 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 Detailed clinic note to follow.  The test report has been scanned into EPIC and is located under the Molecular Pathology section of the Results Review tab.  A portion of the result report is included below for reference.   Jaye Polidori, MS, [facility] Genetic Counselor [email or web address] (P) [phone]

## 2023-09-17 NOTE — Progress Notes (Signed)
 Patient ID: Tonya Andrews, female    DOB: October 20, 1975, 48 y.o.   MRN: [id number]  History of Present Illness: Tonya Andrews is a 48 y.o.  female  with a history of breast cancer.  She presents for preoperative evaluation for upcoming procedure, bilateral mastectomies with sentinel lymph node biopsy and breast reconstruction with tissue expanders, possible ADM/Mesh, Indocyanine green angiography, possible adjacent tissue rearrangement, possible direct to implant reconstruction, scheduled for 8/29 with Dr. Vernetta and Dr. Laronica Bhagat.  The patient has had problems with anesthesia. Nausea and Vomiting.  Summary of Previous Visit: We discussed breast reconstruction options. We have decided to undergo tissue expander placement, possible ADM/Mesh, Indocyanine green angiography, possible adjacent tissue rearrangement, possible direct to implant reconstruction  PMH Significant for: Storke from PFO that was addressed surgically. Breast cancer.    Past Medical History: Allergies: Allergies  Allergen Reactions   Amoxicillin Nausea And Vomiting    Does not ever want again. Made her feel bad.     Bio-35 Gluten-Free [Actical]    Nifedipine Other (See Comments)   Rho D Immune Globulin Other (See Comments)   Latex Other (See Comments)    Unknown to patient, happened during surgery    Current Medications:  Current Outpatient Medications:    albuterol  (VENTOLIN  HFA) 108 (90 Base) MCG/ACT inhaler, Inhale 1-2 puffs into the lungs every 6 (six) hours as needed for wheezing or shortness of breath., Disp: 8.5 g, Rfl: 0   cetirizine (ZYRTEC) 10 MG tablet, Take 10 mg by mouth daily., Disp: , Rfl:    Cyanocobalamin  (VITAMIN B-12) 5000 MCG TBDP, Take 5,000 mcg by mouth daily., Disp: , Rfl:    DULoxetine (CYMBALTA) 30 MG capsule, Take 30-60 mg by mouth See admin instructions. Take 30 mg in the morning and 60 mg at bedtime, Disp: , Rfl:    EMGALITY 120 MG/ML SOAJ, Inject 120 mg into the skin every 30  (thirty) days., Disp: , Rfl:    Ferrous Sulfate (IRON PO), Take 1 tablet by mouth daily. Gummy, Disp: , Rfl:    fluticasone  (FLONASE ) 50 MCG/ACT nasal spray, Place 2 sprays into both nostrils daily for 7 days., Disp: 16 g, Rfl: 2   ibuprofen  (ADVIL ) 200 MG tablet, Take 400 mg by mouth daily., Disp: , Rfl:    methocarbamol  (ROBAXIN ) 750 MG tablet, Take 750 mg by mouth., Disp: , Rfl:    metoprolol  tartrate (LOPRESSOR ) 25 MG tablet, Take 1 tablet (25 mg total) by mouth 2 (two) times daily., Disp: 30 tablet, Rfl: 3   Multiple Vitamins-Minerals (MULTIVITAMIN WITH MINERALS) tablet, Take 1 tablet by mouth daily. Gummy, Disp: , Rfl:    omeprazole (PRILOSEC) 20 MG capsule, Take 20 mg by mouth 2 (two) times daily before a meal. (Patient taking differently: Take 20 mg by mouth 2 (two) times daily before a meal. 1 time daily), Disp: , Rfl:    Probiotic Product (PROBIOTIC PO), Take 1 tablet by mouth daily. 30 billion, Disp: , Rfl:   Past Medical Problems: Past Medical History:  Diagnosis Date   Arthritis    Fibromyalgia    Gallstones    GERD (gastroesophageal reflux disease)    Migraine headache    Dr Oneita   Nasal polyp    Nerve pain    PFO (patent foramen ovale) 04/2009   by bubble study/TEE, Dr Wonda, cardiology   PONV (postoperative nausea and vomiting)    Seasonal allergies    Stroke (HCC) 04/2009, 07/2009   Dr Rosemarie  right arm weakness    Past Surgical History: Past Surgical History:  Procedure Laterality Date   BREAST BIOPSY Left 08/19/2023   US  LT BREAST BX W LOC DEV 1ST LESION IMG BX SPEC US  GUIDE 08/19/2023 [facility] MAMMOGRAPHY   CHOLECYSTECTOMY N/A 05/30/2019   Procedure: LAPAROSCOPIC CHOLECYSTECTOMY;  Surgeon: Tanda Locus, MD;  Location: [facility];  Service: General;  Laterality: N/A;   HARDWARE REMOVAL Right 06/15/2023   Procedure: REMOVAL, HARDWARE;  Surgeon: Sheril Coy, MD;  Location: [facility];  Service: Orthopedics;  Laterality: Right;   KNEE SURGERY Right    x2   PATENT  FORAMEN OVALE(PFO) CLOSURE  09/2009   transcath closure    TOTAL KNEE ARTHROPLASTY Right 06/15/2023   Procedure: ARTHROPLASTY, KNEE, TOTAL;  Surgeon: Sheril Coy, MD;  Location: [facility];  Service: Orthopedics;  Laterality: Right;   WISDOM TOOTH EXTRACTION      Social History: Social History   Socioeconomic History   Marital status: Married    Spouse name: Not on file   Number of children: 3   Years of education: masters   Highest education level: Not on file  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  Tobacco Use   Smoking status: Never    Passive exposure: Never   Smokeless tobacco: Never  Vaping Use   Vaping status: Never Used  Substance and Sexual Activity   Alcohol use: Never   Drug use: No   Sexual activity: Not on file    Comment: Vasectomy  Other Topics Concern   Not on file  Social History Narrative   Lives home with family   Caffeine- coffee, 1 daily   Social Drivers of Corporate investment banker Strain: Not on file  Food Insecurity: No Food Insecurity (09/01/2023)   Hunger Vital Sign    Worried About Running Out of Food in the Last Year: Never true    Ran Out of Food in the Last Year: Never true  Transportation Needs: No Transportation Needs (09/01/2023)   PRAPARE - Administrator, Civil Service (Medical): No    Lack of Transportation (Non-Medical): No  Physical Activity: Not on file  Stress: Not on file  Social Connections: Unknown (03/02/2022)   Received from [facility]   Social Network    Social Network: Not on file  Intimate Partner Violence: Not At Risk (09/01/2023)   Humiliation, Afraid, Rape, and Kick questionnaire    Fear of Current or Ex-Partner: No    Emotionally Abused: No    Physically Abused: No    Sexually Abused: No    Family History: Family History  Problem Relation Age of Onset   Hypertension Mother    Arthritis/Rheumatoid Father    Prostate cancer Father 67   Lung cancer Maternal Uncle 50       smoked    Prostate cancer Maternal Grandfather 55 - 45       metastatic   Eczema Daughter    Asthma Daughter    Eczema Daughter    Asthma Daughter    Allergic rhinitis Daughter    Eczema Son    Asthma Son    Allergic rhinitis Son     Review of Systems: Review of Systems  Constitutional: Negative.   HENT: Negative.    Eyes: Negative.   Respiratory: Negative.    Cardiovascular: Negative.   Gastrointestinal: Negative.   Genitourinary: Negative.   Musculoskeletal: Negative.   Skin: Negative.   Neurological: Negative.   Psychiatric/Behavioral: Negative.  Physical Exam: Vital Signs LMP 08/20/2023 (Approximate)   Physical Exam  Constitutional:      General: Not in acute distress.    Appearance: Normal appearance. Not ill-appearing.  HENT:     Head: Normocephalic and atraumatic.  Eyes:     Pupils: Pupils are equal, round. Cardiovascular:     Rate and Rhythm: Normal rate.    Pulses: Normal pulses.  Pulmonary:     Effort: No respiratory distress or increased work of breathing.  Speaks in full sentences. Abdominal:     General: Abdomen is flat. No distension.   Musculoskeletal: Normal range of motion. No lower extremity swelling or edema. No varicosities.  Skin:    General: Skin is warm and dry.     Findings: No erythema or rash.  Neurological:     Mental Status: Alert and oriented to person, place, and time.  Psychiatric:        Mood and Affect: Mood normal.        Behavior: Behavior normal.    Assessment/Plan: The patient is scheduled for tissue expanders, possible ADM/Mesh, Indocyanine green  angiography, possible adjacent tissue rearrangement, possible direct to implant reconstruction with Dr. Mani Celestin.  Risks, benefits, and alternatives of procedure discussed, questions answered and consent obtained. Instructed to stop weigh loss medications if any.   Smoking Status: No; Counseling Given? Not needed Last Mammogram: 08/13/2023; Results:  Indeterminate 0.6 cm LEFT  breast mass at 10 o'clock 7 CMFN, corresponding to mammographic mass seen at middle depth. This may represent a complicated cyst or solid mass.  Caprini Score: 7; Recommendation for mechanical and pharmacological prophylaxis. Encourage early ambulation.   Pictures obtained: First office visit.   Post-op Rx sent to pharmacy: Yes  Patient was provided with the General Surgical Risk consent document and Pain Medication Agreement prior to their appointment.  They had adequate time to read through the risk consent documents and Pain Medication Agreement. We also discussed them in person together during this preop appointment. All of their questions were answered to their satisfaction.  Recommended calling if they have any further questions.  Risk consent form and Pain Medication Agreement to be scanned into patient's chart.  Electronically signed by: Violeta Lecount M Preciliano Castell, MD 09/17/2023 9:51 AM Dr. Lowery to prescribe Dilaudid  for purpose of Imprivata.

## 2023-09-20 ENCOUNTER — Telehealth: Payer: Self-pay

## 2023-09-20 DIAGNOSIS — M1711 Unilateral primary osteoarthritis, right knee: Secondary | ICD-10-CM | POA: Diagnosis not present

## 2023-09-20 MED ORDER — ACETAMINOPHEN 500 MG PO TABS: 500.0000 mg | ORAL_TABLET | Freq: Four times a day (QID) | ORAL | 0 refills | Status: AC

## 2023-09-20 NOTE — Telephone Encounter (Signed)
 Pt called and stated she does not think she has the correct meds, pt stated she did not receive and antibiotic, pt also rec a medication from Dr Lowery and she does not see Dr Lowery. Please advise, the med from Dr D was made today

## 2023-09-20 NOTE — Addendum Note (Signed)
 Addended by: EUSTACIO POUR on: 09/20/2023 05:07 PM   Modules accepted: Orders

## 2023-09-22 ENCOUNTER — Ambulatory Visit: Payer: Self-pay | Admitting: Genetic Counselor

## 2023-09-22 DIAGNOSIS — Z1379 Encounter for other screening for genetic and chromosomal anomalies: Secondary | ICD-10-CM

## 2023-09-22 NOTE — Progress Notes (Signed)
 HPI:   Tonya Andrews was previously seen in the [facility] due to a personal and family history of cancer and concerns regarding a hereditary predisposition to cancer. Please refer to our prior cancer genetics clinic note for more information regarding our discussion, assessment and recommendations, at the time. Tonya Andrews recent genetic test results were disclosed to Tonya Andrews, as were recommendations warranted by these results. These results and recommendations are discussed in more detail below.  CANCER HISTORY:  Oncology History  Malignant neoplasm of upper-inner quadrant of left breast in female, estrogen receptor negative (HCC)  09/01/2023 Initial Diagnosis   Malignant neoplasm of upper-inner quadrant of left breast in female, estrogen receptor negative (HCC)     FAMILY HISTORY:  We obtained a detailed, 4-generation family history.  Significant diagnoses are listed below:      Family History  Problem Relation Age of Onset   Hypertension Mother     Arthritis/Rheumatoid Father     Prostate cancer Father 34   Lung cancer Maternal Uncle 50        smoked   Prostate cancer Maternal Grandfather 54 - 45        metastatic   Eczema Daughter     Asthma Daughter     Eczema Daughter     Asthma Daughter     Allergic rhinitis Daughter     Eczema Son     Asthma Son     Allergic rhinitis Son               Tonya Andrews is unaware of previous family history of genetic testing for hereditary cancer risks. There is no reported Ashkenazi Jewish ancestry.   GENETIC TEST RESULTS:  The [facility] CancerNext-Expanded Panel found no pathogenic mutations.  The CancerNext-Expanded gene panel offered by [facility] and includes sequencing, rearrangement, and RNA analysis for the following 77 genes: AIP, ALK, APC, ATM, AXIN2, BAP1, BARD1, BMPR1A, BRCA1, BRCA2, BRIP1, CDC73, CDH1, CDK4, CDKN1B, CDKN2A, CEBPA, CHEK2, CTNNA1, DDX41, DICER1, ETV6, FH, FLCN, GATA2, LZTR1, MAX, MBD4, MEN1, MET,  MLH1, MSH2, MSH3, MSH6, MUTYH, NF1, NF2, NTHL1, PALB2, PHOX2B, PMS2, POT1, PRKAR1A, PTCH1, PTEN, RAD51C, RAD51D, RB1, RET, RPS20, RUNX1, SDHA, SDHAF2, SDHB, SDHC, SDHD, SMAD4, SMARCA4, SMARCB1, SMARCE1, STK11, SUFU, TMEM127, TP53, TSC1, TSC2, VHL, and WT1 (sequencing and deletion/duplication); EGFR, HOXB13, KIT, MITF, PDGFRA, POLD1, and POLE (sequencing only); EPCAM and GREM1 (deletion/duplication only).    The test report has been scanned into EPIC and is located under the Molecular Pathology section of the Results Review tab.  A portion of the result report is included below for reference. Genetic testing reported out on 09/15/2023.       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  At this time, it is unknown if this variant is associated with an increased risk for cancer or if it is benign, but most uncertain variants are reclassified to benign. It should not be used to make medical management decisions. With time, we suspect the laboratory will determine the significance of this variant, if any. If the laboratory reclassifies this variant, we will attempt to contact Tonya Andrews to discuss it further.   Even though a pathogenic variant was not identified, possible explanations for the cancer in the family may include: There may be no hereditary risk for cancer in the family. The cancers in Tonya Andrews and/or Tonya Andrews family may be due to other genetic or environmental factors. There may be a gene  mutation in one of these genes that current testing methods cannot detect, but that chance is small. There could be another gene that has not yet been discovered, or that we have not yet tested, that is responsible for the cancer diagnoses in the family.  It is also possible there is a hereditary cause for the cancer in the family that Tonya Andrews did not inherit.  Therefore, it is important to remain in touch with cancer genetics in the future so that we can  continue to offer Tonya Andrews the most up to date genetic testing.   ADDITIONAL GENETIC TESTING:  We discussed with Tonya Andrews that Tonya Andrews genetic testing was fairly extensive.  If there are genes identified to increase cancer risk that can be analyzed in the future, we would be happy to discuss and coordinate this testing at that time.    CANCER SCREENING RECOMMENDATIONS:  Tonya Andrews test result is considered negative (normal).  This means that we have not identified a hereditary cause for Tonya Andrews personal and family history of cancer at this time.   An individual's cancer risk and medical management are not determined by genetic test results alone. Overall cancer risk assessment incorporates additional factors, including personal medical history, family history, and any available genetic information that may result in a personalized plan for cancer prevention and surveillance. Therefore, it is recommended Tonya Andrews continue to follow the cancer management and screening guidelines provided by Tonya Andrews oncology and primary healthcare provider.  RECOMMENDATIONS FOR FAMILY MEMBERS:   Since Tonya Andrews did not inherit a mutation in a cancer predisposition gene included on this panel, Tonya Andrews children could not have inherited a mutation from Tonya Andrews in one of these genes. Individuals in this family might be at some increased risk of developing cancer, over the general population risk, due to the family history of cancer. We recommend women in this family have a yearly mammogram beginning at age 14, or 70 years younger than the earliest onset of cancer, an annual clinical breast exam, and perform monthly breast self-exams.  Other members of the family may still carry a pathogenic variant in one of these genes that Tonya Andrews did not inherit. Based on the family history, we recommend Tonya Andrews mother consider genetic testing due to Tonya Andrews maternal grandfather's history of metastatic prostate cancer.  We do not recommend familial testing for the RET  variant of uncertain significance (VUS).  FOLLOW-UP:  Cancer genetics is a rapidly advancing field and it is possible that new genetic tests will be appropriate for Tonya Andrews and/or Tonya Andrews family members in the future. We encouraged Tonya Andrews to remain in contact with cancer genetics on an annual basis so we can update Tonya Andrews personal and family histories and let Tonya Andrews know of advances in cancer genetics that may benefit this family.   Our contact number was provided. Tonya Andrews questions were answered to Tonya Andrews satisfaction, and Tonya Andrews knows Tonya Andrews is welcome to call us  at anytime with additional questions or concerns.   Alek Poncedeleon, MS, [facility] Genetic Counselor [email or web address] (P) [phone]

## 2023-09-24 ENCOUNTER — Encounter (HOSPITAL_BASED_OUTPATIENT_CLINIC_OR_DEPARTMENT_OTHER): Payer: Self-pay | Admitting: Surgery

## 2023-09-24 ENCOUNTER — Other Ambulatory Visit: Payer: Self-pay

## 2023-09-24 NOTE — Progress Notes (Signed)
   09/24/23 1540  PAT Phone Screen  Is the patient taking a GLP-1 receptor agonist? No  Do You Have Diabetes? No  Do You Have Hypertension? No  Have You Ever Been to the ER for Asthma? No  Have You Taken Oral Steroids in the Past 3 Months? No  Do you Take Phenteramine or any Other Diet Drugs? No  Recent  Lab Work, EKG, CXR? Yes  Where was this test performed? 06/16/23 EKG  Do you have a history of heart problems? Yes  Cardiologist Name H/O stroke, PFO closure - last OV 02/11/23 w/ Dr Lonni, previous cardiac clearance noted 04/12/23 for hip surgery  Have you ever had tests on your heart? Yes  What cardiac tests were performed? Echo  What date/year were cardiac tests completed? 01/18/23 Echo EF 45-50% septal occluder device present  Results viewable: [facility] Media Tab  Any Recent Hospitalizations? No  Height 5' 5 (1.651 m)  Weight 81 kg  Pat Appointment Scheduled No  Reason for No Appointment Not Needed    Cardiac clearance requested with Sari RN at Dr Damian office. Pt reports no longer taking metoprolol  or fish oil.Denies any CP or SOB since last visit with Cardiology.

## 2023-09-27 ENCOUNTER — Telehealth: Payer: Self-pay

## 2023-09-27 MED ORDER — CHLORHEXIDINE GLUCONATE CLOTH 2 % EX PADS: 6.0000 | MEDICATED_PAD | Freq: Once | CUTANEOUS | Status: DC

## 2023-09-27 NOTE — Progress Notes (Signed)

## 2023-09-27 NOTE — Telephone Encounter (Signed)
   Pre-operative Risk Assessment    Patient Name: Tonya Andrews  DOB: 11-Mar-1975 MRN: [id number]   Date of last office visit: 02/11/2023 Date of next office visit: N/A   Request for Surgical Clearance    Procedure:  Mastectomy  Date of Surgery:  Clearance TBD                                 Surgeon:  Vicenta Poli, MD Surgeon's Group or Practice Name:  [facility]  Phone number:  [phone] Fax number:  [phone]   Type of Clearance Requested:   - Medical    Type of Anesthesia:  General    Additional requests/questions:  N/A  SignedMerlynn LITTIE Essex   09/27/2023, 3:23 PM

## 2023-09-27 NOTE — Telephone Encounter (Signed)
 Primary Cardiologist:Bridgette Lonni, MD   Preoperative team, please contact this patient and set up a phone call appointment for further preoperative risk assessment. Please obtain consent and complete medication review. Thank you for your help.   I confirm that guidance regarding antiplatelet and oral anticoagulation therapy has been completed and, if necessary, noted below (none requested).  I also confirmed the patient resides in the state of [place] . As per [facility] Medical Board telemedicine laws, the patient must reside in the state in which the provider is licensed.   Rosaline EMERSON Bane, NP-C  09/27/2023, 4:31 PM [address] [place], [place] [address] Office [phone] Fax [phone]

## 2023-09-27 NOTE — Telephone Encounter (Signed)
 Called patient, NA, left message to contact our office to schedule telehealth appointment for cardiac clearance.

## 2023-09-28 ENCOUNTER — Ambulatory Visit: Attending: Nurse Practitioner | Admitting: Nurse Practitioner

## 2023-09-28 ENCOUNTER — Encounter: Payer: Self-pay | Admitting: Nurse Practitioner

## 2023-09-28 ENCOUNTER — Telehealth: Payer: Self-pay | Admitting: Rehabilitation

## 2023-09-28 ENCOUNTER — Encounter: Payer: Self-pay | Admitting: Rehabilitation

## 2023-09-28 DIAGNOSIS — Z01810 Encounter for preprocedural cardiovascular examination: Secondary | ICD-10-CM

## 2023-09-28 NOTE — Telephone Encounter (Signed)
 Pt stated her surgery is scheduled for 10/01/23. Pt was scheduled for VV on 09/28/23

## 2023-09-28 NOTE — Progress Notes (Signed)
 Virtual Visit via Telephone Note   Because of Tonya Andrews co-morbid illnesses, she is at least at moderate risk for complications without adequate follow up.  This format is felt to be most appropriate for this patient at this time.  Due to technical limitations with video connection Web designer), today's appointment will be conducted as an audio only telehealth visit, and Tonya Andrews verbally agreed to proceed in this manner.   All issues noted in this document were discussed and addressed.  No physical exam could be performed with this format.  Evaluation Performed:  Preoperative cardiovascular risk assessment _____________   Date:  09/28/2023   Patient ID:  Tonya Andrews, DOB [birth year], MRN [id number] Patient Location:  Home Provider location:   Office  Primary Care Provider:  Verena Mems, MD Primary Cardiologist:  Tonya Bruckner, MD  Chief Complaint / Patient Profile   48 y.o. y/o female with a h/o ASD s/p closure in 2011, TIA, POTS, chronic fatigue/fibromyalgia who is pending mastectomy with Tonya. Vernetta on date TBD and presents today for telephonic preoperative cardiovascular risk assessment.  History of Present Illness    Tonya Andrews is a 48 y.o. female who presents via audio/video conferencing for a telehealth visit today.  Pt was last seen in cardiology clinic on 02/11/2023 by Tonya. Bruckner. She was cleared to undergo knee arthroplasty on 04/12/2023 via virtual visit with Tonya Finder, NP  At that time [facility] was doing well.  The patient is now pending procedure as outlined above. Since her last visit, she denies chest pain, shortness of breath, lower extremity edema, fatigue, palpitations, melena, hematuria, hemoptysis, diaphoresis, weakness, presyncope, syncope, orthopnea, and PND. She is recovering from recent knee surgery but is able to complete > 4 METS activity without concerning cardiac symptoms.   Past Medical History    Past Medical History:   Diagnosis Date   Arthritis    Celiac disease    Fibromyalgia    Gallstones    GERD (gastroesophageal reflux disease)    Migraine headache    Tonya Oneita   Nasal polyp    Nerve pain    PFO (patent foramen ovale) 04/2009   by bubble study/TEE, Tonya Wonda, cardiology   PONV (postoperative nausea and vomiting)    Seasonal allergies    Stroke (HCC) 04/2009, 07/2009   Tonya Rosemarie right arm weakness   Past Surgical History:  Procedure Laterality Date   BREAST BIOPSY Left 08/19/2023   US  LT BREAST BX W LOC DEV 1ST LESION IMG BX SPEC US  GUIDE 08/19/2023 [facility] MAMMOGRAPHY   CHOLECYSTECTOMY N/A 05/30/2019   Procedure: LAPAROSCOPIC CHOLECYSTECTOMY;  Surgeon: Tanda Locus, MD;  Location: [facility];  Service: General;  Laterality: N/A;   HARDWARE REMOVAL Right 06/15/2023   Procedure: REMOVAL, HARDWARE;  Surgeon: Sheril Coy, MD;  Location: [facility];  Service: Orthopedics;  Laterality: Right;   KNEE SURGERY Right    x2   PATENT FORAMEN OVALE(PFO) CLOSURE  09/2009   transcath closure    TOTAL KNEE ARTHROPLASTY Right 06/15/2023   Procedure: ARTHROPLASTY, KNEE, TOTAL;  Surgeon: Sheril Coy, MD;  Location: [facility];  Service: Orthopedics;  Laterality: Right;   WISDOM TOOTH EXTRACTION      Allergies  Allergies  Allergen Reactions   Amoxicillin Nausea And Vomiting    Does not ever want again. Made her feel bad.     Nifedipine Other (See Comments)   Rho D Immune Globulin Other (See Comments)   Latex Other (See Comments)  Unknown to patient, happened during surgery    Home Medications    Prior to Admission medications   Medication Sig Start Date End Date Taking? Authorizing Provider  acetaminophen  (TYLENOL ) 500 MG tablet Take 1 tablet (500 mg total) by mouth every 6 (six) hours. 09/20/23   Montorfano, Lisandro M, MD  albuterol  (VENTOLIN  HFA) 108 (90 Base) MCG/ACT inhaler Inhale 1-2 puffs into the lungs every 6 (six) hours as needed for wheezing or shortness of breath. 12/20/20   Elnor Jayson LABOR, DO  cetirizine  (ZYRTEC ) 10 MG tablet Take 10 mg by mouth daily.    [provider]  Cyanocobalamin  (VITAMIN B-12) 5000 MCG TBDP Take 5,000 mcg by mouth daily.    [provider]  DULoxetine  (CYMBALTA ) 30 MG capsule Take 30-60 mg by mouth See admin instructions. Take 30 mg in the morning and 60 mg at bedtime 04/08/20   [provider]  EMGALITY 120 MG/ML SOAJ Inject 120 mg into the skin every 30 (thirty) days.    [provider]  Ferrous Sulfate (IRON PO) Take 1 tablet by mouth daily. Gummy    [provider]  fluticasone  (FLONASE ) 50 MCG/ACT nasal spray Place 2 sprays into both nostrils daily for 7 days. 12/20/20 09/01/23  Elnor Jayson LABOR, DO  gabapentin  (NEURONTIN ) 300 MG capsule Take 1 capsule (300 mg total) by mouth 2 (two) times daily. 09/17/23   Montorfano, Lisandro M, MD  ibuprofen  (ADVIL ) 600 MG tablet Take 1 tablet (600 mg total) by mouth every 8 (eight) hours as needed. 09/17/23   Montorfano, Lisandro M, MD  methocarbamol  (ROBAXIN ) 750 MG tablet Take 750 mg by mouth.    [provider]  Multiple Vitamins-Minerals (MULTIVITAMIN WITH MINERALS) tablet Take 1 tablet by mouth daily. Gummy    [provider]  omeprazole (PRILOSEC) 20 MG capsule Take 20 mg by mouth 2 (two) times daily before a meal. Patient taking differently: Take 20 mg by mouth 2 (two) times daily before a meal. 1 time daily    [provider]  Probiotic Product (PROBIOTIC PO) Take 1 tablet by mouth daily. 30 billion    [provider]    Physical Exam    Vital Signs:  Tonya Andrews does not have vital signs available for review today.  Given telephonic nature of communication, physical exam is limited. AAOx3. NAD. Normal affect.  Speech and respirations are unlabored.  Accessory Clinical Findings    None  Assessment & Plan    1.  Preoperative Cardiovascular Risk Assessment: According to the Revised Cardiac Risk Index (RCRI), her  Perioperative Risk of Major Cardiac Event is (%): 6.6. Her Functional Capacity in METs is: 8.23 according to the Duke Activity Status Index (DASI). The patient is doing well from a cardiac perspective. Therefore, based on ACC/AHA guidelines, the patient would be at acceptable risk for the planned procedure without further cardiovascular testing.   The patient was advised that if she develops new symptoms prior to surgery to contact our office to arrange for a follow-up visit, and she verbalized understanding.  No request to hold cardiac medications.  A copy of this note will be routed to requesting surgeon.  Time:   Today, I have spent 10 minutes with the patient with telehealth technology discussing medical history, symptoms, and management plan.     Tonya EMERSON Bane, NP-C  09/28/2023, 3:21 PM [address] [place], [place] [address] Office [phone] Fax [phone]

## 2023-09-28 NOTE — Telephone Encounter (Signed)
 Left voice mail to schedule a post-op visit with physical therapy.  Surgery is now scheduled.

## 2023-09-30 NOTE — H&P (Signed)
 REFERRING PHYSICIAN: Latisha Truman CROME, MD PROVIDER: VICENTA DASIE POLI, MD MRN: [id number] DOB: [birth year]   Subjective   Chief Complaint: NEW BREAST CANCER   History of Present Illness: Tonya Andrews is a 48 y.o. female who is seen as an office consultation for evaluation of NEW BREAST CANCER   This is a 48 year old female who was found to have an abnormality on recent screening mammography of the left breast. She underwent an ultrasound and further imaging showing a 6 mm x 4 mm x 5 mm mass at the 10 o'clock position of the left breast 7 cm from the nipple. 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 She has had no previous problems regarding her breast. 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 She just recently had a knee replacement. On imaging, ultrasound of her axilla was negative. She has had no previous problems regarding her breast and there is no family history of breast cancer.  Review of Systems: A complete review of systems was obtained from the patient. I have reviewed this information and discussed as appropriate with the patient. See HPI as well for other ROS.  ROS   Medical History: Past Medical History:  Diagnosis Date  History of cancer  History of stroke   There is no problem list on file for this patient.  Past Surgical History:  Procedure Laterality Date  CHOLECYSTECTOMY  JOINT REPLACEMENT  PFO closure    Allergies  Allergen Reactions  Amoxicillin Nausea And Vomiting and Other (See Comments)  Does not ever want again. Made her feel bad.  Latex Other (See Comments) and Rash  Unknown to patient, happened during surgery  Calcium Carb-Vit D2-Mg-Bioflav Other (See Comments)  Nifedipine Other (See Comments)  Rho(D) Immune Globulin Other (See Comments)   Current Outpatient Medications on File Prior to Visit  Medication Sig Dispense  Refill  cetirizine  (ZYRTEC ) 10 mg capsule Take 10 mg by mouth once daily  cholecalciferol 1000 unit tablet Take by mouth  cyanocobalamin , vitamin B-12, 5,000 mcg TbDL Take 5,000 mcg by mouth once daily  DULoxetine  (CYMBALTA ) 30 MG DR capsule 3 CAPSULES ORALLY TWICE A DAY 90 DAYS  EMGALITY PEN 120 mg/mL PnIj ONE- 120 MG AUTOINJECTOR ONCE PER MONTH FOR MIGRAINE PREVENTION (3 AUTOINECTORS = 90 DAY SUPPLY)  FERROUS SULFATE ORAL Take 1 tablet by mouth  methocarbamoL  (ROBAXIN ) 750 MG tablet TAKE 1 TABLET BY MOUTH TWICE A DAY AS NEEDED FOR POST OP PAIN AND SPASM  multivitamin tablet Take 1 tablet by mouth once daily  multivitamin with minerals tablet Take 1 tablet by mouth  omega-3 acid ethyl esters (LOVAZA) 1 gram capsule Take 2 g by mouth 2 (two) times daily  pantoprazole  (PROTONIX ) 40 MG DR tablet Take 40 mg by mouth 2 (two) times daily  tirzepatide (ZEPBOUND) 10 mg/0.5 mL pen injector Inject 10 mg subcutaneously every 7 (seven) days   No current facility-administered medications on file prior to visit.   Family History  Problem Relation Age of Onset  High blood pressure (Hypertension) Father  Coronary Artery Disease (Blocked arteries around heart) Father    Social History   Tobacco Use  Smoking Status Never  Smokeless Tobacco Never    Social History   Socioeconomic History  Marital status: Married  Tobacco Use  Smoking status: Never  Smokeless tobacco: Never  Substance and Sexual Activity  Alcohol use: Never  Drug use: Never   Social Drivers of Health   Housing Stability: Unknown (08/26/2023)  Housing Stability Vital Sign  Homeless in the Last Year: No   Objective:   Vitals:    BP: 118/80  Pulse: (!) 126  Temp: 36.6 C (97.9 F)  SpO2: 96%  Weight: 81.7 kg (180 lb 3.2 oz)  Height: 165.1 cm (5' 5)  PainSc: 2  PainLoc: Breast   Body mass index is 29.99 kg/m.  Physical Exam   A chaperone was present for the examination.  She appears well on exam.  There  are no palpable breast masses. The nipple areolar complex are normal in appearance. There is no axillary adenopathy.  Labs, Imaging and Diagnostic Testing: I have reviewed her notes in the electronic medical records. I have reviewed her ultrasound, mammograms, and pathology results  Assessment and Plan:   Diagnoses and all orders for this visit:  Invasive ductal carcinoma of left breast (CMS/HHS-HCC) - Ambulatory Referral to Radiation Oncology - Ambulatory Referral to Oncology-Medical - Ambulatory Referral to Physical Therapy - Ambulatory Referral to Plastic Surgery - MRI breast bilateral with and without contrast; Future   We have discussed her in our multidisciplinary breast cancer conference. I gave the patient and her husband a copy of the pathology results we discussed these in detail. 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 From a surgical standpoint we went over options including a radioactive seed guided left breast lumpectomy with sentinel node biopsy versus mastectomy and the long-term results of each. We also discussed the need for Port-A-Cath placement at time of surgery for chemotherapy. We discussed that given her age and breast density as well as triple negative status that we will be ordering bilateral breast MRI. She will also be referred to the cancer center to see medical and radiation oncology, genetics, and physical therapy. We will also refer her to plastic surgeons as she is considering mastectomies. After a discussion with her and her husband, they understand the diagnosis and treatment options. They will make a decision regarding surgery after the imaging and discussion with the other specialist.  Addendum:  After seeing oncology, she has had a CT of the chest, abd, and pelvis which were unremarkable and she also had a normal bone scan.  She has also seen plastic surgery.  We will now proceed with bilateral mastectomies, a left axillary sentinel node biopsy, and  port-a-cath insertion as well as reconstruction by plastic surgery

## 2023-10-01 ENCOUNTER — Ambulatory Visit (HOSPITAL_COMMUNITY)

## 2023-10-01 ENCOUNTER — Encounter (HOSPITAL_BASED_OUTPATIENT_CLINIC_OR_DEPARTMENT_OTHER): Admission: RE | Disposition: A | Discharge status: Home / Self Care | Payer: Self-pay | Source: Home / Self Care

## 2023-10-01 ENCOUNTER — Ambulatory Visit (HOSPITAL_BASED_OUTPATIENT_CLINIC_OR_DEPARTMENT_OTHER): Admitting: Anesthesiology

## 2023-10-01 ENCOUNTER — Encounter (HOSPITAL_BASED_OUTPATIENT_CLINIC_OR_DEPARTMENT_OTHER): Payer: Self-pay | Admitting: Surgery

## 2023-10-01 ENCOUNTER — Observation Stay (HOSPITAL_BASED_OUTPATIENT_CLINIC_OR_DEPARTMENT_OTHER): Admission: RE | Admit: 2023-10-01 | Discharge: 2023-10-02 | Disposition: A | Discharge status: Home / Self Care

## 2023-10-01 ENCOUNTER — Other Ambulatory Visit: Payer: Self-pay

## 2023-10-01 DIAGNOSIS — N6001 Solitary cyst of right breast: Secondary | ICD-10-CM | POA: Insufficient documentation

## 2023-10-01 DIAGNOSIS — N6089 Other benign mammary dysplasias of unspecified breast: Secondary | ICD-10-CM | POA: Diagnosis not present

## 2023-10-01 DIAGNOSIS — R0989 Other specified symptoms and signs involving the circulatory and respiratory systems: Secondary | ICD-10-CM | POA: Diagnosis not present

## 2023-10-01 DIAGNOSIS — N6021 Fibroadenosis of right breast: Secondary | ICD-10-CM | POA: Diagnosis not present

## 2023-10-01 DIAGNOSIS — Z421 Encounter for breast reconstruction following mastectomy: Secondary | ICD-10-CM | POA: Diagnosis not present

## 2023-10-01 DIAGNOSIS — C50912 Malignant neoplasm of unspecified site of left female breast: Secondary | ICD-10-CM | POA: Diagnosis present

## 2023-10-01 DIAGNOSIS — J9811 Atelectasis: Secondary | ICD-10-CM | POA: Diagnosis not present

## 2023-10-01 DIAGNOSIS — N6011 Diffuse cystic mastopathy of right breast: Secondary | ICD-10-CM | POA: Diagnosis not present

## 2023-10-01 DIAGNOSIS — C50919 Malignant neoplasm of unspecified site of unspecified female breast: Secondary | ICD-10-CM | POA: Diagnosis present

## 2023-10-01 DIAGNOSIS — G8918 Other acute postprocedural pain: Secondary | ICD-10-CM | POA: Diagnosis not present

## 2023-10-01 DIAGNOSIS — Z91040 Latex allergy status: Secondary | ICD-10-CM | POA: Insufficient documentation

## 2023-10-01 DIAGNOSIS — Z01818 Encounter for other preprocedural examination: Principal | ICD-10-CM

## 2023-10-01 DIAGNOSIS — N6081 Other benign mammary dysplasias of right breast: Secondary | ICD-10-CM | POA: Diagnosis not present

## 2023-10-01 DIAGNOSIS — C50812 Malignant neoplasm of overlapping sites of left female breast: Secondary | ICD-10-CM | POA: Diagnosis not present

## 2023-10-01 HISTORY — PX: TOTAL MASTECTOMY: SHX6129

## 2023-10-01 HISTORY — PX: TISSUE EXPANDER PLACEMENT: SHX2530

## 2023-10-01 HISTORY — PX: PORTACATH PLACEMENT: SHX2246

## 2023-10-01 HISTORY — PX: SENTINEL NODE BIOPSY: SHX6608

## 2023-10-01 HISTORY — DX: Celiac disease: K90.0

## 2023-10-01 LAB — POCT PREGNANCY, URINE: Preg Test, Ur: NEGATIVE

## 2023-10-01 MED ORDER — ROCURONIUM BROMIDE 100 MG/10ML IV SOLN
INTRAVENOUS | Status: DC | PRN
Start: 2023-10-01 — End: 2023-10-01
  Administered 2023-10-01 (×5): 10 mg via INTRAVENOUS
  Administered 2023-10-01: 50 mg via INTRAVENOUS
  Administered 2023-10-01: 10 mg via INTRAVENOUS

## 2023-10-01 MED ORDER — NITROGLYCERIN 2 % TD OINT
TOPICAL_OINTMENT | TRANSDERMAL | Status: DC | PRN
  Administered 2023-10-01: 2 [in_us] via TOPICAL

## 2023-10-01 MED ORDER — ONDANSETRON HCL 4 MG/2ML IJ SOLN
INTRAMUSCULAR | Status: AC
  Filled 2023-10-01: qty 2

## 2023-10-01 MED ORDER — ENOXAPARIN SODIUM 40 MG/0.4ML IJ SOSY
40.0000 mg | PREFILLED_SYRINGE | Freq: Once | INTRAMUSCULAR | Status: AC
  Administered 2023-10-01: 40 mg via SUBCUTANEOUS

## 2023-10-01 MED ORDER — CHLORHEXIDINE GLUCONATE CLOTH 2 % EX PADS: 6.0000 | MEDICATED_PAD | Freq: Once | CUTANEOUS | Status: DC

## 2023-10-01 MED ORDER — HYDROMORPHONE HCL 1 MG/ML IJ SOLN: 0.5000 mg | INTRAMUSCULAR | Status: DC | PRN

## 2023-10-01 MED ORDER — PHENYLEPHRINE HCL-NACL 20-0.9 MG/250ML-% IV SOLN
INTRAVENOUS | Status: DC | PRN
  Administered 2023-10-01: 40 ug/min via INTRAVENOUS

## 2023-10-01 MED ORDER — EPHEDRINE SULFATE (PRESSORS) 50 MG/ML IJ SOLN
INTRAMUSCULAR | Status: DC | PRN
  Administered 2023-10-01: 10 mg via INTRAVENOUS
  Administered 2023-10-01: 15 mg via INTRAVENOUS

## 2023-10-01 MED ORDER — METHOCARBAMOL 500 MG PO TABS
750.0000 mg | ORAL_TABLET | Freq: Three times a day (TID) | ORAL | Status: DC
  Administered 2023-10-01: 750 mg via ORAL
  Filled 2023-10-01: qty 2

## 2023-10-01 MED ORDER — MIDAZOLAM HCL 2 MG/2ML IJ SOLN
INTRAMUSCULAR | Status: AC
  Filled 2023-10-01: qty 2

## 2023-10-01 MED ORDER — BACITRACIN ZINC 500 UNIT/GM EX OINT
TOPICAL_OINTMENT | CUTANEOUS | Status: AC
  Filled 2023-10-01: qty 28.35

## 2023-10-01 MED ORDER — OXYCODONE HCL 5 MG PO TABS
5.0000 mg | ORAL_TABLET | ORAL | Status: DC | PRN
  Administered 2023-10-01: 10 mg via ORAL
  Filled 2023-10-01: qty 2

## 2023-10-01 MED ORDER — MIDAZOLAM HCL 5 MG/5ML IJ SOLN
INTRAMUSCULAR | Status: DC | PRN
  Administered 2023-10-01: 2 mg via INTRAVENOUS

## 2023-10-01 MED ORDER — BUPIVACAINE-EPINEPHRINE 0.5% -1:200000 IJ SOLN
INTRAMUSCULAR | Status: DC | PRN
  Administered 2023-10-01: 15 mL

## 2023-10-01 MED ORDER — GABAPENTIN 300 MG PO CAPS
ORAL_CAPSULE | ORAL | Status: AC
  Filled 2023-10-01: qty 1

## 2023-10-01 MED ORDER — HEPARIN (PORCINE) IN NACL 2-0.9 UNITS/ML
INTRAMUSCULAR | Status: AC | PRN
  Administered 2023-10-01: 500 mL

## 2023-10-01 MED ORDER — EPHEDRINE 5 MG/ML INJ
INTRAVENOUS | Status: AC
  Filled 2023-10-01: qty 5

## 2023-10-01 MED ORDER — POVIDONE-IODINE 10 % EX SOLN
CUTANEOUS | Status: DC | PRN
  Administered 2023-10-01 (×2): 1 via TOPICAL

## 2023-10-01 MED ORDER — PROPOFOL 10 MG/ML IV BOLUS
INTRAVENOUS | Status: DC | PRN
  Administered 2023-10-01: 150 mg via INTRAVENOUS

## 2023-10-01 MED ORDER — CEFAZOLIN SODIUM-DEXTROSE 2-4 GM/100ML-% IV SOLN
2.0000 g | INTRAVENOUS | Status: AC
  Administered 2023-10-01: 2 g via INTRAVENOUS

## 2023-10-01 MED ORDER — LIDOCAINE 2% (20 MG/ML) 5 ML SYRINGE
INTRAMUSCULAR | Status: DC | PRN
  Administered 2023-10-01: 50 mg via INTRAVENOUS

## 2023-10-01 MED ORDER — PANTOPRAZOLE SODIUM 40 MG IV SOLR
40.0000 mg | Freq: Every day | INTRAVENOUS | Status: DC
  Administered 2023-10-01: 40 mg via INTRAVENOUS
  Filled 2023-10-01: qty 10

## 2023-10-01 MED ORDER — DULOXETINE HCL 30 MG PO CPEP: 30.0000 mg | ORAL_CAPSULE | ORAL | Status: DC

## 2023-10-01 MED ORDER — HEPARIN SOD (PORK) LOCK FLUSH 100 UNIT/ML IV SOLN
INTRAVENOUS | Status: DC | PRN
Start: 2023-10-01 — End: 2023-10-01
  Administered 2023-10-01: 500 [IU]

## 2023-10-01 MED ORDER — SUGAMMADEX SODIUM 200 MG/2ML IV SOLN
INTRAVENOUS | Status: DC | PRN
Start: 2023-10-01 — End: 2023-10-01
  Administered 2023-10-01: 200 mg via INTRAVENOUS

## 2023-10-01 MED ORDER — PROPOFOL 500 MG/50ML IV EMUL
INTRAVENOUS | Status: DC | PRN
  Administered 2023-10-01: 25 ug/kg/min via INTRAVENOUS

## 2023-10-01 MED ORDER — ROCURONIUM BROMIDE 10 MG/ML (PF) SYRINGE
PREFILLED_SYRINGE | INTRAVENOUS | Status: AC
  Filled 2023-10-01: qty 10

## 2023-10-01 MED ORDER — MAGTRACE LYMPHATIC TRACER
INTRAMUSCULAR | Status: DC | PRN
  Administered 2023-10-01: 2 mL via INTRAMUSCULAR

## 2023-10-01 MED ORDER — ACETAMINOPHEN 500 MG PO TABS: 1000.0000 mg | ORAL_TABLET | ORAL | Status: AC

## 2023-10-01 MED ORDER — FENTANYL CITRATE (PF) 100 MCG/2ML IJ SOLN
100.0000 ug | Freq: Once | INTRAMUSCULAR | Status: AC
  Administered 2023-10-01: 50 ug via INTRAVENOUS

## 2023-10-01 MED ORDER — ALBUMIN HUMAN 5 % IV SOLN: INTRAVENOUS | Status: DC | PRN

## 2023-10-01 MED ORDER — FENTANYL CITRATE (PF) 100 MCG/2ML IJ SOLN
INTRAMUSCULAR | Status: AC
  Filled 2023-10-01: qty 2

## 2023-10-01 MED ORDER — MIDAZOLAM HCL 2 MG/2ML IJ SOLN
INTRAMUSCULAR | Status: AC
Start: 2023-10-01 — End: 2023-10-01
  Filled 2023-10-01: qty 2

## 2023-10-01 MED ORDER — GENTAMICIN SULFATE 40 MG/ML IJ SOLN
INTRAMUSCULAR | Status: AC
  Filled 2023-10-01: qty 10

## 2023-10-01 MED ORDER — VASHE WOUND IRRIGATION OPTIME
TOPICAL | Status: DC | PRN
  Administered 2023-10-01: 34 [oz_av]

## 2023-10-01 MED ORDER — MIDAZOLAM HCL 2 MG/2ML IJ SOLN
2.0000 mg | Freq: Once | INTRAMUSCULAR | Status: AC
  Administered 2023-10-01: 2 mg via INTRAVENOUS

## 2023-10-01 MED ORDER — ACETAMINOPHEN 500 MG PO TABS
1000.0000 mg | ORAL_TABLET | ORAL | Status: AC
  Administered 2023-10-01: 1000 mg via ORAL

## 2023-10-01 MED ORDER — GABAPENTIN 300 MG PO CAPS
300.0000 mg | ORAL_CAPSULE | Freq: Two times a day (BID) | ORAL | Status: DC
  Administered 2023-10-01: 300 mg via ORAL

## 2023-10-01 MED ORDER — LACTATED RINGERS IV SOLN: INTRAVENOUS | Status: DC

## 2023-10-01 MED ORDER — SODIUM CHLORIDE 0.9 % IV SOLN
INTRAVENOUS | Status: AC
  Filled 2023-10-01: qty 10

## 2023-10-01 MED ORDER — AMISULPRIDE (ANTIEMETIC) 5 MG/2ML IV SOLN
10.0000 mg | Freq: Once | INTRAVENOUS | Status: DC | PRN
Start: 2023-10-01 — End: 2023-10-02

## 2023-10-01 MED ORDER — INDOCYANINE GREEN 25 MG IV SOLR
INTRAVENOUS | Status: DC | PRN
  Administered 2023-10-01: 7.5 mg via INTRAVENOUS
  Administered 2023-10-01: 10 mg via INTRAVENOUS

## 2023-10-01 MED ORDER — ONDANSETRON HCL 4 MG/2ML IJ SOLN
INTRAMUSCULAR | Status: DC | PRN
  Administered 2023-10-01: 4 mg via INTRAVENOUS

## 2023-10-01 MED ORDER — HYDROMORPHONE HCL 1 MG/ML IJ SOLN
INTRAMUSCULAR | Status: AC
  Filled 2023-10-01: qty 0.5

## 2023-10-01 MED ORDER — CEFAZOLIN SODIUM-DEXTROSE 2-4 GM/100ML-% IV SOLN
INTRAVENOUS | Status: AC
  Filled 2023-10-01: qty 100

## 2023-10-01 MED ORDER — IBUPROFEN 600 MG PO TABS
600.0000 mg | ORAL_TABLET | Freq: Four times a day (QID) | ORAL | Status: DC | PRN
  Administered 2023-10-01: 600 mg via ORAL
  Filled 2023-10-01: qty 1

## 2023-10-01 MED ORDER — FLUTICASONE PROPIONATE 50 MCG/ACT NA SUSP: 2.0000 | Freq: Every day | NASAL | Status: DC

## 2023-10-01 MED ORDER — CETIRIZINE HCL 10 MG PO TABS
10.0000 mg | ORAL_TABLET | Freq: Every day | ORAL | Status: DC
  Filled 2023-10-01: qty 1

## 2023-10-01 MED ORDER — ACETAMINOPHEN 500 MG PO TABS
ORAL_TABLET | ORAL | Status: AC
  Filled 2023-10-01: qty 2

## 2023-10-01 MED ORDER — HYDROMORPHONE HCL 1 MG/ML IJ SOLN
0.2500 mg | INTRAMUSCULAR | Status: DC | PRN
  Administered 2023-10-01: 0.25 mg via INTRAVENOUS
  Administered 2023-10-01: 0.5 mg via INTRAVENOUS
  Administered 2023-10-01: 0.25 mg via INTRAVENOUS

## 2023-10-01 MED ORDER — CIPROFLOXACIN IN D5W 400 MG/200ML IV SOLN: 400.0000 mg | INTRAVENOUS | Status: DC

## 2023-10-01 MED ORDER — PHENYLEPHRINE HCL (PRESSORS) 10 MG/ML IV SOLN
INTRAVENOUS | Status: DC | PRN
  Administered 2023-10-01 (×2): 160 ug via INTRAVENOUS
  Administered 2023-10-01 (×3): 80 ug via INTRAVENOUS
  Administered 2023-10-01: 240 ug via INTRAVENOUS
  Administered 2023-10-01: 80 ug via INTRAVENOUS
  Administered 2023-10-01: 160 ug via INTRAVENOUS
  Administered 2023-10-01: 80 ug via INTRAVENOUS

## 2023-10-01 MED ORDER — LIDOCAINE 2% (20 MG/ML) 5 ML SYRINGE
INTRAMUSCULAR | Status: AC
  Filled 2023-10-01: qty 5

## 2023-10-01 MED ORDER — GENTAMICIN SULFATE 40 MG/ML IJ SOLN
INTRAMUSCULAR | Status: DC | PRN
  Administered 2023-10-01: 500 mL

## 2023-10-01 MED ORDER — ONDANSETRON 4 MG PO TBDP
4.0000 mg | ORAL_TABLET | Freq: Four times a day (QID) | ORAL | Status: DC | PRN
Start: 2023-10-01 — End: 2023-10-02

## 2023-10-01 MED ORDER — ALBUTEROL SULFATE HFA 108 (90 BASE) MCG/ACT IN AERS: 1.0000 | INHALATION_SPRAY | Freq: Four times a day (QID) | RESPIRATORY_TRACT | Status: DC | PRN

## 2023-10-01 MED ORDER — INDOCYANINE GREEN 25 MG IV SOLR
25.0000 mg | Freq: Once | INTRAVENOUS | Status: AC
  Administered 2023-10-01: 7.5 mg via TOPICAL

## 2023-10-01 MED ORDER — CLONIDINE HCL (ANALGESIA) 100 MCG/ML EP SOLN
EPIDURAL | Status: DC | PRN
Start: 2023-10-01 — End: 2023-10-01
  Administered 2023-10-01 (×2): 50 ug

## 2023-10-01 MED ORDER — 0.9 % SODIUM CHLORIDE (POUR BTL) OPTIME
TOPICAL | Status: DC | PRN
Start: 2023-10-01 — End: 2023-10-01
  Administered 2023-10-01 (×3): 1000 mL

## 2023-10-01 MED ORDER — SODIUM CHLORIDE 0.9 % IR SOLN
Status: DC | PRN
  Administered 2023-10-01: 3000 mg via TOPICAL

## 2023-10-01 MED ORDER — HYDROGEN PEROXIDE 3 % EX SOLN
CUTANEOUS | Status: DC | PRN
  Administered 2023-10-01: 1

## 2023-10-01 MED ORDER — FENTANYL CITRATE (PF) 100 MCG/2ML IJ SOLN
INTRAMUSCULAR | Status: DC | PRN
  Administered 2023-10-01: 25 ug via INTRAVENOUS
  Administered 2023-10-01 (×2): 50 ug via INTRAVENOUS
  Administered 2023-10-01: 25 ug via INTRAVENOUS

## 2023-10-01 MED ORDER — SCOPOLAMINE 1 MG/3DAYS TD PT72
MEDICATED_PATCH | TRANSDERMAL | Status: AC
  Filled 2023-10-01: qty 1

## 2023-10-01 MED ORDER — OXYCODONE HCL 5 MG/5ML PO SOLN: 5.0000 mg | Freq: Once | ORAL | Status: DC | PRN

## 2023-10-01 MED ORDER — CIPROFLOXACIN IN D5W 400 MG/200ML IV SOLN
INTRAVENOUS | Status: AC
  Filled 2023-10-01: qty 200

## 2023-10-01 MED ORDER — OXYCODONE HCL 5 MG PO TABS: 5.0000 mg | ORAL_TABLET | Freq: Once | ORAL | Status: DC | PRN

## 2023-10-01 MED ORDER — PHENYLEPHRINE 80 MCG/ML (10ML) SYRINGE FOR IV PUSH (FOR BLOOD PRESSURE SUPPORT)
PREFILLED_SYRINGE | INTRAVENOUS | Status: AC
  Filled 2023-10-01: qty 10

## 2023-10-01 MED ORDER — ONDANSETRON HCL 4 MG/2ML IJ SOLN: 4.0000 mg | Freq: Four times a day (QID) | INTRAMUSCULAR | Status: DC | PRN

## 2023-10-01 MED ORDER — CYCLOBENZAPRINE HCL 10 MG PO TABS
5.0000 mg | ORAL_TABLET | Freq: Three times a day (TID) | ORAL | Status: DC | PRN
  Administered 2023-10-01: 5 mg via ORAL
  Filled 2023-10-01: qty 1

## 2023-10-01 MED ORDER — DEXAMETHASONE SODIUM PHOSPHATE 10 MG/ML IJ SOLN
INTRAMUSCULAR | Status: AC
  Filled 2023-10-01: qty 1

## 2023-10-01 MED ORDER — ENOXAPARIN SODIUM 40 MG/0.4ML IJ SOSY
PREFILLED_SYRINGE | INTRAMUSCULAR | Status: AC
  Filled 2023-10-01: qty 0.4

## 2023-10-01 MED ORDER — BACITRACIN ZINC 500 UNIT/GM EX OINT
TOPICAL_OINTMENT | CUTANEOUS | Status: DC | PRN
  Administered 2023-10-01: 1 via TOPICAL

## 2023-10-01 MED ORDER — PROPOFOL 10 MG/ML IV BOLUS
INTRAVENOUS | Status: AC
  Filled 2023-10-01: qty 20

## 2023-10-01 MED ORDER — CEFAZOLIN SODIUM-DEXTROSE 2-4 GM/100ML-% IV SOLN
2.0000 g | Freq: Three times a day (TID) | INTRAVENOUS | Status: DC
  Administered 2023-10-01 – 2023-10-02 (×2): 2 g via INTRAVENOUS
  Filled 2023-10-01 (×2): qty 100

## 2023-10-01 MED ORDER — ACETAMINOPHEN 500 MG PO TABS
1000.0000 mg | ORAL_TABLET | Freq: Four times a day (QID) | ORAL | Status: DC
Start: 2023-10-01 — End: 2023-10-02
  Administered 2023-10-01 – 2023-10-02 (×3): 1000 mg via ORAL
  Filled 2023-10-01 (×3): qty 2

## 2023-10-01 MED ORDER — SCOPOLAMINE 1 MG/3DAYS TD PT72
1.0000 | MEDICATED_PATCH | TRANSDERMAL | Status: DC
  Administered 2023-10-01: 1 mg via TRANSDERMAL

## 2023-10-01 MED ORDER — ROPIVACAINE HCL 5 MG/ML IJ SOLN
INTRAMUSCULAR | Status: DC | PRN
  Administered 2023-10-01 (×2): 30 mL via PERINEURAL

## 2023-10-01 MED ORDER — DEXAMETHASONE SODIUM PHOSPHATE 4 MG/ML IJ SOLN
INTRAMUSCULAR | Status: DC | PRN
  Administered 2023-10-01: 10 mg via INTRAVENOUS

## 2023-10-01 MED ORDER — NITROGLYCERIN 2 % TD OINT
TOPICAL_OINTMENT | TRANSDERMAL | Status: AC
  Filled 2023-10-01: qty 30

## 2023-10-01 MED ORDER — FENTANYL CITRATE (PF) 100 MCG/2ML IJ SOLN
INTRAMUSCULAR | Status: AC
Start: 2023-10-01 — End: 2023-10-01
  Filled 2023-10-01: qty 2

## 2023-10-01 MED ORDER — GABAPENTIN 300 MG PO CAPS
300.0000 mg | ORAL_CAPSULE | ORAL | Status: AC
  Administered 2023-10-01: 300 mg via ORAL

## 2023-10-01 SURGICAL SUPPLY — 1 items: NDL HYPO 25X1 1.5 SAFETY (NEEDLE) ×8 IMPLANT

## 2023-10-01 NOTE — Progress Notes (Signed)
 Assisted Dr. Casilda Carls with left, right, pectoralis, ultrasound guided block. Side rails up, monitors on throughout procedure. See vital signs in flow sheet. Tolerated Procedure well.

## 2023-10-01 NOTE — Discharge Instructions (Signed)
 [facility] Plastic Surgery Specialist  General Instructions Given to Patient In Preoperative Visit Drain care instructions as follows:  What is the benefit of having a drain?  During surgery your tissue layers are separated.  This raw surface stimulates your body to fill the space with serous fluid.  This is normal but you don't want that fluid to collect and prevent healing.  A fluid collection can also become infected.  The Jackson-Pratt (JP) drain is used to eliminate this collection of fluid and allow the tissue to heal together.    Jackson-Pratt (JP) bulb    How to care for your drainage and suction unit at home Your drainage catheter will be connected to a collection device. The vacuum caused when the device is compressed allows drainage to collect in the device.    Wash your hands with soap and water before and after touching the system. Empty the JP drain every 12 hours once you get home from your procedure. Record the fluid amount on the record sheet included. Start with stripping the drain tube to push the clots or excess fluid to the bulb.  Do this by pinching the tube with one hand near your skin.  Then with the other hand squeeze the tubing and work it toward the bulb.  This should be done several times a day.  This may collapse the tube which will correct on its own.   Use a safety pin to attach your collection device to your clothing so there is no tension on the insertion site.   If you have drainage at the skin insertion site, you can apply a gauze dressing and secure it with tape. If the drain falls out, apply a gauze dressing over the drain insertion site and secure with tape.   To empty the collection device:   Release the stopper on the top of the collection unit (bulb).  Pour contents into a measuring container such as a plastic medicine cup.  Record the day and amount of drainage on the attached sheet. This should be done at least twice a day.    To compress the  Jackson-Pratt Bulb:  Release the stopper at the top of the bulb. Squeeze the bulb tightly in your fist, squeezing air out of the bulb.  Replace the stopper while the bulb is compressed.  Be careful not to spill the contents when squeezing the bulb. The drainage will start bright red and turn to pink and then yellow with time. IMPORTANT: If the bulb is not squeezed before adding the stopper it will not draw out the fluid.  Care for the JP drain site and your skin daily:  You may shower three days after surgery. Secure the drain to a ribbon or cloth around your waist while showering so it does not pull out while showering. Be sure your hands are cleaned with soap and water. Use a clean wet cotton swab to clean the skin around the drain site.  Use another cotton swab to place Vaseline or antibiotic ointment on the skin around the drain.     Contact your physician if any of the following occur:  The fluid in the bulb becomes cloudy. Your temperature is greater than 101.4.  The incision opens. If you have drainage at the skin insertion site, you can apply a gauze dressing and secure it with tape. If the drain falls out, apply a gauze dressing over the drain insertion site and secure with tape.  You will usually have more  drainage when you are active than while you rest or are asleep. If the drainage increases significantly or is bloody call the physician                             Bring this record with you to each office visit Date  Drainage Volume  Date   Drainage volume

## 2023-10-01 NOTE — Anesthesia Preprocedure Evaluation (Addendum)
 Anesthesia Evaluation  Patient identified by MRN, date of birth, ID band Patient awake    Reviewed: Allergy  & Precautions, NPO status , Patient's Chart, lab work & pertinent test results  History of Anesthesia Complications (+) PONV and history of anesthetic complications  Airway Mallampati: II  TM Distance: >3 FB Neck ROM: Full    Dental  (+) Dental Advisory Given   Pulmonary neg pulmonary ROS   breath sounds clear to auscultation       Cardiovascular negative cardio ROS  Rhythm:Regular Rate:Normal     Neuro/Psych  Headaches  Neuromuscular disease CVA, Residual Symptoms    GI/Hepatic Neg liver ROS,GERD  ,,  Endo/Other  negative endocrine ROS    Renal/GU negative Renal ROS     Musculoskeletal  (+) Arthritis ,  Fibromyalgia -  Abdominal   Peds  Hematology negative hematology ROS (+)   Anesthesia Other Findings   Reproductive/Obstetrics                              Anesthesia Physical Anesthesia Plan  ASA: 3  Anesthesia Plan: General   Post-op Pain Management: Tylenol  PO (pre-op)*, Gabapentin  PO (pre-op)*, Regional block* and Toradol  IV (intra-op)*   Induction: Intravenous  PONV Risk Score and Plan: 4 or greater and Ondansetron , Dexamethasone , Propofol  infusion, Midazolam  and Scopolamine  patch - Pre-op  Airway Management Planned: Oral ETT  Additional Equipment:   Intra-op Plan:   Post-operative Plan: Extubation in OR  Informed Consent: I have reviewed the patients History and Physical, chart, labs and discussed the procedure including the risks, benefits and alternatives for the proposed anesthesia with the patient or authorized representative who has indicated his/her understanding and acceptance.     Dental advisory given  Plan Discussed with: CRNA  Anesthesia Plan Comments:          Anesthesia Quick Evaluation

## 2023-10-01 NOTE — Anesthesia Procedure Notes (Signed)
 Procedure Name: Intubation Date/Time: 10/01/2023 10:44 AM  Performed by: Buster Catheryn SAUNDERS, CRNAPre-anesthesia Checklist: Patient identified, Emergency Drugs available, Suction available and Patient being monitored Patient Re-evaluated:Patient Re-evaluated prior to induction Oxygen Delivery Method: Circle system utilized Preoxygenation: Pre-oxygenation with 100% oxygen Induction Type: IV induction Ventilation: Mask ventilation without difficulty Laryngoscope Size: Miller and 2 Grade View: Grade II Tube type: Oral Tube size: 7.0 mm Number of attempts: 1 Airway Equipment and Method: Stylet and Oral airway Placement Confirmation: ETT inserted through vocal cords under direct vision, positive ETCO2 and breath sounds checked- equal and bilateral Secured at: 22 cm Tube secured with: Tape Dental Injury: Teeth and Oropharynx as per pre-operative assessment

## 2023-10-01 NOTE — Anesthesia Procedure Notes (Signed)
 Anesthesia Regional Block: Pectoralis block   Pre-Anesthetic Checklist: , timeout performed,  Correct Patient, Correct Site, Correct Laterality,  Correct Procedure, Correct Position, site marked,  Risks and benefits discussed,  Surgical consent,  Pre-op evaluation,  At surgeon's request and post-op pain management  Laterality: Left and Right  Prep: chloraprep       Needles:  Injection technique: Single-shot  Needle Type: Echogenic Needle     Needle Length: 9cm  Needle Gauge: 21     Additional Needles:   Procedures:,,,, ultrasound used (permanent image in chart),,    Narrative:  Start time: 10/01/2023 9:40 AM End time: 10/01/2023 9:52 AM Injection made incrementally with aspirations every 5 mL.  Performed by: Personally  Anesthesiologist: Epifanio Charleston, MD

## 2023-10-01 NOTE — Interval H&P Note (Signed)
 History and Physical Interval Note: no change in H and P  10/01/2023 8:30 AM  Tonya Andrews  has presented today for surgery, with the diagnosis of LEFT BREAST CANCER.  The various methods of treatment have been discussed with the patient and family. After consideration of risks, benefits and other options for treatment, the patient has consented to  Procedure(s) with comments: MASTECTOMY, SIMPLE (Bilateral) BIOPSY, LYMPH NODE, SENTINEL (Left) INSERTION, TUNNELED CENTRAL VENOUS DEVICE, WITH PORT (N/A) - PORT-A-CATH INSERTION WITH ULTRASOUND GUIDANCE INSERTION, TISSUE EXPANDER (Bilateral) - WITH ACELLULAR DERMAL MATRIX as a surgical intervention.  The patient's history has been reviewed, patient examined, no change in status, stable for surgery.  I have reviewed the patient's chart and labs.  Questions were answered to the patient's satisfaction.     Tonya Andrews

## 2023-10-01 NOTE — H&P (Addendum)
 Tonya Andrews is a 48 y.o. yrs old with left breast cancer here for bilateral mastectomies with left sentinel lymph node biopsy, port placement, insertion of bilateral tissue expanders, possible mesh, possible adjacent tissue rearrangement, ICG angiography and any other indicated procedures.Informed consent was obtained directly from Tonya Andrews. Risks, benefits and alternatives were fully discussed. Specific risks including but not limited to bleeding, infection, hematoma, seroma, scarring, pain, implant infection, implant extrusion, capsular contracture, asymmetry, chronic pain, wound healing problems, and need for further surgery were all discussed. Tonya Andrews did have an ample opportunity to have Tonya Andrews questions answered to Tonya Andrews satisfaction.  After consideration of risks, benefits and other options for treatment, Tonya Andrews has consented to Tonya above mentioned procedures.   Past Medical History: Allergies: Allergies  Allergen Reactions   Amoxicillin Nausea And Vomiting    Does not ever want again. Made Tonya Andrews feel bad.     Nifedipine Other (See Comments)   Rho D Immune Globulin Other (See Comments)   Latex Other (See Comments)    Unknown to Andrews, happened during surgery    Current Medications:  Current Facility-Administered Medications:    ceFAZolin  (ANCEF ) IVPB 2g/100 mL premix, 2 g, Intravenous, On Call to OR, Guthrie Lemme, Nieves HERO, MD   6 CHG cloth bath night before surgery, , , Once **AND** 6 CHG cloth bath AM of surgery, , , Once **AND** Chlorhexidine  Gluconate Cloth 2 % PADS 6 each, 6 each, Topical, Once **AND** Chlorhexidine  Gluconate Cloth 2 % PADS 6 each, 6 each, Topical, Once, Vernetta Berg, MD   6 CHG cloth bath night before surgery, , , Once **AND** [START ON 10/02/2023] 6 CHG cloth bath AM of surgery, , , Once **AND** Chlorhexidine  Gluconate Cloth 2 % PADS 6 each, 6 each, Topical, Once **AND** Chlorhexidine  Gluconate Cloth 2 % PADS 6 each, 6 each, Topical, Once, Lenay Lovejoy,  Artie Mcintyre M, MD   ciprofloxacin  (CIPRO ) IVPB 400 mg, 400 mg, Intravenous, On Call to OR, Vernetta Berg, MD   indocyanine green  (IC-GREEN ) injection 25 mg, 25 mg, Topical, Once, Female Iafrate M, MD   lactated ringers  infusion, , Intravenous, Continuous, Ellender, Bernardino SQUIBB, MD   scopolamine  (TRANSDERM-SCOP) 1 MG/3DAYS 1 mg, 1 patch, Transdermal, On Call to OR, Strother Everitt M, MD, 1 mg at 10/01/23 0846  Past Medical Problems: Past Medical History:  Diagnosis Date   Arthritis    Celiac disease    Fibromyalgia    Gallstones    GERD (gastroesophageal reflux disease)    Migraine headache    Dr Oneita   Nasal polyp    Nerve pain    PFO (patent foramen ovale) 04/2009   by bubble study/TEE, Dr Wonda, cardiology   PONV (postoperative nausea and vomiting)    Seasonal allergies    Stroke (HCC) 04/2009, 07/2009   Dr Rosemarie right arm weakness    Past Surgical History: Past Surgical History:  Procedure Laterality Date   BREAST BIOPSY Left 08/19/2023   US  LT BREAST BX W LOC DEV 1ST LESION IMG BX SPEC US  GUIDE 08/19/2023 [facility]MO[facility]AP[facility]   CHOLECYSTECTOMY N/A 05/30/2019   Procedure: LAPAROSCOPIC CHOLECYSTECTOMY;  Surgeon: Tanda Locus, MD;  Location: [facility];  Service: General;  Laterality: N/A;   HARDWARE REMOVAL Right 06/15/2023   Procedure: REMOVAL, HARDWARE;  Surgeon: Sheril Coy, MD;  Location: [facility];  Service: Orthopedics;  Laterality: Right;   KNEE SURGERY Right    x2   PATENT FORAMEN OVALE(PFO) CLOSURE  09/2009   transcath closure    TOTAL KNEE  ARTHROPLASTY Right 06/15/2023   Procedure: ARTHROPLASTY, KNEE, TOTAL;  Surgeon: Sheril Coy, MD;  Location: [facility];  Service: Orthopedics;  Laterality: Right;   WISDOM TOOTH EXTRACTION      Tonya Andrews has had anesthesia or sedation in Tonya past.   Tonya Andrews has not had problems with anesthesia.    Social History: Social History   Socioeconomic History   Marital status: Married    Spouse name: Not on file    Number of children: 3   Years of education: masters   Highest education level: Not on file  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  Tobacco Use   Smoking status: Never    Passive exposure: Never   Smokeless tobacco: Never  Vaping Use   Vaping status: Never Used  Substance and Sexual Activity   Alcohol use: Never   Drug use: No   Sexual activity: Not on file    Comment: Vasectomy  Other Topics Concern   Not on file  Social History Narrative   Lives home with family   Caffeine- coffee, 1 daily   Social Drivers of Corporate investment banker Strain: Not on file  Food Insecurity: No Food Insecurity (09/01/2023)   Hunger Vital Sign    Worried About Running Out of Food in Tonya Last Year: Never true    Ran Out of Food in Tonya Last Year: Never true  Transportation Needs: No Transportation Needs (09/01/2023)   PRAPARE - Administrator, Civil Service (Medical): No    Lack of Transportation (Non-Medical): No  Physical Activity: Not on file  Stress: Not on file  Social Connections: Unknown (03/02/2022)   Received from [facility]   Social Network    Social Network: Not on file  Intimate Partner Violence: Not At Risk (09/01/2023)   Humiliation, Afraid, Rape, and Kick questionnaire    Fear of Current or Ex-Partner: No    Emotionally Abused: No    Physically Abused: No    Sexually Abused: No    Family History: Family History  Problem Relation Age of Onset   Hypertension Mother    Arthritis/Rheumatoid Father    Prostate cancer Father 61   Lung cancer Maternal Uncle 50       smoked   Prostate cancer Maternal Grandfather 36 - 45       metastatic   Eczema Daughter    Asthma Daughter    Eczema Daughter    Asthma Daughter    Allergic rhinitis Daughter    Eczema Son    Asthma Son    Allergic rhinitis Son     LABS: Recent Results (from Tonya past 2160 hours)  Surgical pathology     Status: None   Collection Time: 08/19/23 12:00 AM  Result Value Ref Range    SURGICAL PATHOLOGY      SURGICAL PATHOLOGY [facility] [address] [place], [place] [address] Telephone [phone] or [phone] Fax [phone]  REPORT OF SURGICAL PATHOLOGY   Accession #: [id number] Andrews Name: Tonya Andrews, Tonya Andrews Visit # : [id number]  MRN: [id number] Physician: Avanell Lenis DOB/Age [birth year] (Age: 71) Gender: F Collected Date: 08/19/2023 Received Date: 08/19/2023  FINAL DIAGNOSIS       1. Breast, left, needle core biopsy, 10 o'clock, 7cmfn, heart clip :       INVASIVE MODERATELY DIFFERENTIATED DUCTAL ADENOCARCINOMA, GRADE 2 (3+2+2)      TUBULE FORMATION: SCORE 3      NUCLEAR PLEOMORPHISM:  SCORE 2      MITOTIC COUNT: SCORE 2      TOTAL SCORE: 7      OVERALL GRADE: GRADE 2 (7/9)      NEGATIVE FOR ANGIOLYMPHATIC INVASION      TUMOR MEASURES 5 MM IN GREATEST LINEAR EXTENT       Diagnosis Note : Immunohistochemical stains for Tonya breast prognostic markers      have been ordered, and these results will be issued within a subsequen t addendum      to this report.      Case is reviewed by Dr. Macie who concurs with Tonya interpretation.      Diagnosis called to [facility] by Dr.      Reed on 08/20/2023 at 9:47 AM.      DATE SIGNED OUT: 08/20/2023 ELECTRONIC SIGNATURE : Picklesimer Md, Fred , Sports administrator, Electronic Signature  MICROSCOPIC DESCRIPTION  CASE COMMENTS STAINS USED IN DIAGNOSIS: H&E-2 H&E-3 H&E-4 H&E Universal Negative Control-DAB Universal Negative Control-DAB Universal Negative Control-DAB *RECUT 1 SLIDE Stains used in diagnosis 1 Her2 by IHC, 1 ER-ACIS, 1 KI-67-ACIS, 1 PR-ACIS IHC scores are reported using ASCO/CAP scoring criteria.  An IHC Score of 0 or 1+  is NEGATIVE for HER2, 3+ is POSITIVE for HER2, and 2+ is EQUIVOCAL. Equivocal results are reflexed to either FISH or IHC testing. Specimens are fixed in 10% Neutral Buffered Formalin for at least 6 hours  and up to 72 hours. These tests have not be validated on decalcified tissue.   Results should be interpreted with caution given Tonya possibility of false negative results on decalcified specimens. Antibody Clone for HER2 is 4B5 (PATHWAY). Some of these immunohistochemical stains may have been developed and Tonya performance characteristics determined by [facility].  Some may not have been cleared or approved by Tonya [place] Food and Drug Administration.  Tonya FDA has determined that such clearance or approval is not necessary.  This test is used for clinical purposes.  It should not be regarded as investigational or for research.  This laboratory is certified under Tonya Clinical Laboratory Improvement Amendments of 1988 (CLIA-88) as qualified to perform high complexity clinical laboratory testing. Estrogen receptor (6F11), immunohistochemical stains are performed on formalin fixed, paraffin embedded tissue using a 3,3-diaminobenzidine (DAB) chromogen and Leica Bond Autostainer System.  Tonya staining intensity of Tonya nucleus is scored manually  and is reported as Tonya percentage of tumor cell nuclei demonstrating specific nuclear staining.Specimens are fixed in 10% Neutral Buffered Formalin for at least 6 hours and up to 72 hours.  These tests have not be validated on decalcified tissue.  Results should be interpreted with caution given Tonya possibility of false negative results on decalcified specimens. Ki-67 (MM1), immunohistochemical stains are performed on formalin fixed, paraffin embedded tissue using a 3,3-diaminobenzidine (DAB) chromogen and Leica Bond Autostainer System.  Tonya staining intensity of Tonya nucleus is scored manually and is reported as Tonya percentage of tumor cell nuclei demonstrating specific nuclear staining.Specimens are fixed in 10% Neutral Buffered Formalin for at least 6 hours and up to 72 hours. These tests have not be validated on decalcified tissue.  Results  should be interpreted with caution given Tonya possibility of false negative results on decalcified specimens. PR progesterone rece ptor (16), immunohistochemical stains are performed on formalin fixed, paraffin embedded tissue using a 3,3-diaminobenzidine (DAB) chromogen and Leica Bond Autostainer System.  Tonya staining intensity of Tonya nucleus is scored manually and is reported  as Tonya percentage of tumor cell nuclei demonstrating specific nuclear staining.Specimens are fixed in 10% Neutral Buffered Formalin for at least 6 hours and up to 72 hours. These tests have not be validated on decalcified tissue.  Results should be interpreted with caution given Tonya possibility of false negative results on decalcified specimens.  ADDENDUM 1) Breast, left, needle core biopy, 10 o'clock, 7 cmfn, heart clip PROGNOSTIC INDICATORS  Results: IMMUNOHISTOCHEMICAL AND MORPHOMETRIC ANALYSIS PERFORMED MANUALLY Tonya tumor cells are NEGATIVE for Her2 (1+). Estrogen Receptor:  0%, NEGATIVE Progesterone Receptor:  0%, NEGATIVE Proliferation Marker Ki67:  40% COMMENT:  Tonya negative hormone receptor study(ies) in this ca se has an internal positive control.  REFERENCE RANGE ESTROGEN RECEPTOR NEGATIVE     0% POSITIVE       =>1% REFERENCE RANGE PROGESTERONE RECEPTOR NEGATIVE     0% POSITIVE        =>1% All controls stained appropriately Pepper Dutton Md, Pathologist, Electronic Signature ( Signed 07 21 2025)   CLINICAL HISTORY  SPECIMEN(S) OBTAINED 1. Breast, left, needle core biopsy, 10 O'clock, 7cmfn, Heart Clip  SPECIMEN COMMENTS: 1. TIF: 8:01 AM, CIT < 1 minute; 6mm left breast mass SPECIMEN CLINICAL INFORMATION: 1. Benign vs carcinoma    Gross Description 1. Received in formalin labeled with Tonya Andrews's name and left breast 10 o'clock 7 cm fn are three yellow-tan needle core biopsy fragments averaging 1.5 cm in length x 0.2 cm in diameter.  Tonya specimen is entirely submitted in 1A.       Time in formalin is 8:01 a.m. on 08/19/23.  CIT is less than 1 minute.  (WC:kh      08/19/23)        Report signed out from Tonya following location(s) [place] [address] [place], [place] [address] CLIA #: [id number]  [facility] [address] [place], [place] [address] CLIA #: [id number]   Genetic Screening Order     Status: None   Collection Time: 09/01/23  2:09 PM  Result Value Ref Range   Genetic Screening Order Collected by Laboratory     Comment: Performed at [facility], [address] [place], [place] [address]  Pregnancy, urine POC     Status: None   Collection Time: 10/01/23  8:55 AM  Result Value Ref Range   Preg Test, Ur NEGATIVE NEGATIVE    Comment:        Tonya SENSITIVITY OF THIS METHODOLOGY IS >20 mIU/mL.    Tonya Andrews's history has been reviewed, Andrews examined, no change in status, stable for surgery. I reviewed Tonya consulting or referring physician's paperwork. Andrews questions were answered to Tonya Andrews's satisfaction. Andrews marked and consent for surgery form outpatient facility obtained.   Deanza Upperman M. Jamika Sadek, MD [facility]  ]

## 2023-10-01 NOTE — Transfer of Care (Signed)
 Immediate Anesthesia Transfer of Care Note  Patient: Tonya Andrews  Procedure(s) Performed: MASTECTOMY, SIMPLE (Bilateral: Breast) BIOPSY, LYMPH NODE, SENTINEL (Left: Axilla) INSERTION, TUNNELED CENTRAL VENOUS DEVICE, WITH PORT (Chest) INSERTION, TISSUE EXPANDER (Bilateral: Chest)  Patient Location: PACU  Anesthesia Type:General  Level of Consciousness: sedated  Airway & Oxygen Therapy: Patient Spontanous Breathing and Patient connected to face mask oxygen  Post-op Assessment: Report given to RN and Post -op Vital signs reviewed and stable  Post vital signs: Reviewed and stable  Last Vitals:  Vitals Value Taken Time  BP 109/76 10/01/23 16:55  Temp 37.1 C 10/01/23 16:55  Pulse 84 10/01/23 16:55  Resp 16 10/01/23 16:56  SpO2 97 % 10/01/23 16:55  Vitals shown include unfiled device data.  Last Pain:  Vitals:   10/01/23 0842  TempSrc: Temporal  PainSc: 0-No pain      Patients Stated Pain Goal: 3 (10/01/23 9157)  Complications: No notable events documented.

## 2023-10-01 NOTE — Op Note (Signed)
 Tonya Andrews 10/01/2023   Pre-op Diagnosis: LEFT BREAST CANCER     Post-op Diagnosis: SAME  Procedure(s): BILATERAL TOTAL MASTECTOMY DEEP LEFT AXILLARY SENTINEL NODE BIOPSY RIGHT INTERNAL JUGULAR PORT-A-CATH INSERTION WITH ULTRASOUND GUIDANCE INJECTION OF MAG TRACE FOR LYMPH NODE MAPPING   Surgeon: Vicenta Poli, MD  Anesthesia: General  Staff:  Circulator: McDonough-Hughes, Jodi C, RN Radiology Technologist: Villa-Robles, Vianca Y, RT Relief Circulator: Jason Chroman, RN Relief Scrub: Lelon Daphne BROCKS, RN Scrub Person: Alto Charmaine MARIE Eliberto, Geroge HERO, RN Vendor Representative : Shermon Jakob; Teresa Quarry Surgical/MD Assistant: Andris Estefana BRAVO, PA-C  Estimated Blood Loss: less than 50 mL               Specimens: sent to path  Indications: This is a 48 year old female was found to have a small 6 mm mass in her left breast in the upper inner quadrant on screening mammography.  A biopsy showed a triple negative left breast cancer.  Ultrasound of the axilla was unremarkable.  She had a CT scan of her chest, abdomen, and pelvis which was unremarkable for metastatic disease.  A bone scan was also unremarkable for signs of metastatic disease.  After a thorough discussion, the patient wished to proceed with bilateral total mastectomy, left axillary sentinel lymph node biopsy, Port-A-Cath insertion for IV chemotherapy, and immediate reconstruction by plastic surgery  Procedure: The patient was brought to the operating room identified as the correct patient.  She was placed upon on the operating room table and general anesthesia was induced.  Under sterile technique I then injected 2 cc of mag trace underneath the left nipple areolar complex and massaged the breast.  The patient's arms were then tucked.  Her right chest and neck were then prepped and draped in the usual sterile fashion.  Under ultrasound guidance I identified the right internal jugular vein.  Using the  introducer needle I was then able to cannulate the right internal jugular vein under ultrasound guidance.  I then passed a wire through the needle and into the central venous system.  The wire was confirmed to be in the central venous system under fluoroscopy.  I next made a separate skin incision on the patient's right upper chest with a scalpel.  Using the cautery I then created a pocket for a port.  An 8 French port and then brought to the field and flushed appropriately.  It fit easily into the pocket.  Using an 11 blade I made an incision around the wire introduction site at the neck.  I then passed the venous dilator and introducer sheath over the wire and into the central venous system.  The wire and dilator were then removed.  Using a tunneling device I then passed the tunneler from the chest incision out through the neck incision.  I passed the catheter through the neck incision out through the chest incision.  I then attached the port to the catheter.  I cut the catheter appropriate length.  I then fed the catheter down the peel-away sheath.  The sheath was then slowly peeled away leaving the catheter and central venous system.  Under fluoroscopy it appeared to be in the superior vena cava.  I accessed the port and good flush and return redemonstrated.  I then instilled concentrated heparin  solution into the port.  I sewed the port to the chest wall with a single 3-0 Prolene suture.  I then closed the Port-A-Cath site with interrupted 3-0 Vicryl sutures and a running  4-0 Monocryl.  I closed the small neck incision with a 3-0 Vicryl suture and a 4-0 Monocryl suture as well.  Dermabond was then applied.  All drapes were then removed.  The patient's arms were placed back out.  Her bilateral breast, chest, axilla, neck were then prepped and draped in usual sterile fashion.  The plastic surgeon had marked the patient's breast bilaterally where he would like the incisions to have been placed.  I made a slightly  circular/triangular incision around the right nipple areolar complex.  I then dissected down to the breast tissue circumferentially with the cautery.  With the aid of the skin hooks I then created mastectomy flaps circumferentially.  I dissected first medially toward the sternum and down to the chest wall.  I then dissected superiorly and inferiorly continued to come around the breast tissue taking it from the overlying skin and dermis.  I then dissected laterally and took the dissection down toward the latissimus.  I then slowly dissected the breast tissue off of the chest wall dissecting medial to lateral with the cautery removing the breast from the pectoralis fascia.  Once the breast was completely removed I marked it laterally with a silk suture.  The breast was then sent to pathology for evaluation.  We irrigated the mastectomy site with saline.  Hemostasis fair to be achieved with the cautery.  We placed a laparotomy pad soaked in TXA into the mastectomy site.  I then created a similar incision around the left nipple areolar complex with a scalpel through the marked site.  I again dissected circumferentially around the nipple areolar complex into the breast tissue with the cautery.  I then again started my skin flap medially going toward the the sternum and then down to the chest wall and then continued dissection superior and inferiorly taking the breast tissue off of the skin and dermis.  I then continued to dissect laterally past the pectoralis muscle toward the latissimus.  Once I reached the chest wall and axillary region I then dissected the breast tissue off of the chest wall with electrocautery dissecting medial to lateral past the pectoralis muscle.  Once the mastectomy was completed I marked the lateral margin with a silk suture and sent to pathology for evaluation.  I next dissected down into the deep left axillary tissue.  Using the mag trace probe I was able to identify at least 2 sentinel  lymph nodes with uptake of mag trace.  These were removed en bloc with the surrounding fat using the cautery and surgical clips.  The lymph nodes was then sent to pathology for evaluation.  I found no enlarged lymph nodes and no further uptake of the mag trace with the mag trace probe.  I then irrigated the left chest wall with saline.  Hemostasis appeared to be achieved in the chest wall and axilla.  I then placed another TXA soaked laparotomy pad into the mastectomy site. This point the patient was tolerated the procedure well.  All the counts were correct at my portion of the procedure.  Plastic surgery then presented to the room to continue the immediate reconstruction.          Vicenta Poli   Date: 10/01/2023  Time: 12:53 PM

## 2023-10-01 NOTE — Op Note (Addendum)
 Op report    DATE OF OPERATION:  10/01/2023  LOCATION: [facility]  SURGICAL DIVISION: Plastic Surgery  PREOPERATIVE DIAGNOSES:  1. Left breast cancer.    POSTOPERATIVE DIAGNOSES:  1. Left Breast cancer.   PROCEDURE:  Injection ICG dye for assessment of vascular flow to bilateral mastectomy flaps via Fluorescein ICG SPY Angiography (CPT CODE 06000) Bilateral Subpectoral Breast reconstruction with insertion of tissue expanders (CPT CODE 80642 x2). Right breast [place] Artoura PLUS  smooth tissue expander 535 cc REF: [id number]; SN: [id number] Left breast [place] Artoura PLUS  smooth tissue expander 535 cc REF: [id number]; SN: [id number] Bilateral adjacent tissue transfer of inferior pole adipodermal flaps with back cuts medially and laterally to encompass tissue expander for coverage and secured to chest wall, measuring approximately 9cm x 5cm (45 square cm) on the right and 12 cm x 9 cm (108 square cm) on the left. (CPT CODE 85698 x2, CPT CODE 85697 x 1) Bilateral implantation of mesh for tissue reinforcement, Ovitex PRS biologic mesh 10.5 x20cm x2 (CPT CODE 84222 x2)  MODIFIER: -22 We used the -22 modifier because of the additional length and complexity of surgery over and above a usual tissue expander placement requiring significantly greater effort, time, and technical difficulty of procedure. This is related to the fact that we created an inferior based vascularized dermal flap that was utilized to cover the lower portion of the expander.   SURGEON: Bretton Tandy, MD  ASSISTANT: Estefana Peck, PA  ANESTHESIA:  General.   COMPLICATIONS: None.   DESCRIPTION OF PROCEDURE:  Indications:  This is a 48 year old female who presented with left breast cancer and plans to undergo bilateral mastectomy through a wise pattern incision. She desires breast reconstruction and was deemed a good candidate for first stage reconstruction with placement of tissue  expander. She is a primary patient of Dr. Vernetta and myself and I am performing the first stage portion of her reconstruction today. Risks, benefits, and alternatives to surgery were discussed with the patient including but not limited to bleeding, infection, scarring, pain, mastectomy flap necrosis, implant malposition, loss of the implant, infection to the implant, need for re-operation, need for further surgeries, asymmetry, unaesthetic results,  capsular contracture, damage to surrounding structures, need for revision procedures, deep vein thrombosis/pulmonary embolus, death. The patient voiced understanding and wished to proceed. A written consent was signed. Please see original consult note for details on the informed consent discussion.  Findings: ICG SPY angiography demonstrated skin flap decreased perfusion along the lateral aspect of the left mastectomy flap. The decision was made to place the expanders in the subpectoral plane and use a dermal flap to reinforce the reconstruction.  At the end of the case, SPY imaging demonstrated delayed perfusion to the T junction bilaterally but still lit up on imaging and so it was deemed clinically adequate perfusion to be able to leave the tissue expanders in place.   DESCRIPTION OF PROCEDURE: The patient was brought into the OR with surgical oncology and placed on the operating room in a supine position. A formal surgical checklist was completed to ensure the correct patient was undergoing correct operation. The patient had SCDs on and functioning prior to induction. General anaesthesia was next induced and endotracheal intubation was performed. Preoperative antibiotics were administered to the patient. The patient was positioned with bilateral arms out and all bony prominences were padded.  The beginning portion of the procedure will be dictated by Dr. Vernetta - please refer to  her note for more information.  At conclusion of the  mastectomies, we  entered the room. Anesthesia was instructed to inject 5cc of indocyanine green  dye into her veins via the IV and a 10cc flush to follow. Using fluorescence angiography imaging, I was able to assess the mastectomy flaps for perfusion. Unfortunately, ICG SPY angiography demonstrated skin flap decreased perfusion along the lateral aspect of the left mastectomy flap. The decision was made to place the expanders in the subpectoral plane and use a dermal flap to reinforce the reconstruction. Multiple rounds of hemostasis were performed until hemostasis was satisfactory.   We started by first de-epithelializing the inferiorly based adipodermal flaps that measured to be approximately  9cm x 5cm (45 square cm) on the right and 12 cm x 9 cm (108 square cm) on the left. We created back cuts medially and laterally along the inframammary fold crease along the flap in order to shape and advance the inferiorly based flap to cover the expander that we were going to insert.    Attention was turned to the right breast. The edge of pectoralis major was identified, and using electrocautery, the subpectoral plane was developed. The inferior margin of the pectoralis was maintained, and dissection was carried inferiorly above rectus fascia to the level of the infra-mammary fold. A piece of 10.5 cm x 20cm Ovitex was placed within the pocket and secured to the inferior and lateral chest wall using interrupted 2-0 PDS sutures. The pocket was thoroughly irrigated with triple antibiotic solution. The tissue expander was placed within the pocket. The superomedial edge of the Ovitex was then secured to the pectoralis major muscle using running 2-0 PDS suture. One 43 [place] JP drains was placed and secured into place with a 3-0 nylon.  The same procedure was performed on the left breast. The edge of pectoralis major was identified, and using electrocautery, the subpectoral plane was developed. The inferior margin of the pectoralis was  maintained, and dissection was carried inferiorly above rectus fascia to the level of the infra-mammary fold. A piece of 10.5 cm x 20cm Ovitex was placed within the pocket and secured to the inferior and lateral chest wall using interrupted 2-0 PDS sutures. The pocket was thoroughly irrigated with triple antibiotic solution. The tissue expander was placed within the pocket. The superomedial edge of the Ovitex was then secured to the pectoralis major muscle using running 2-0 PDS suture. Two 15 French JP drains were placed (one in the axillary dissection pocket) and secured into place with a 3-0 nylon.  Another 5cc of ICG dye was injected by anesthesia into her vein and followed by a 10cc flush to assess for mastectomy flap perfusion using fluorescence angiography imaging and the flaps were found to be well perfused after tissue expander placement; however, there was delayed perfusion along the right along the right and left T-junction measuring about 4mm.  The incisions were closed in a layered fashion using 3-0 monocryl deep dermal suture and a running 4-0 Monocryl. The tissue expanders were not filled due previous concerns for perfusion.  Dermabond was placed on the incisions and steri strips. Nitro-bid  paste with bacitracin  was applied onto T-point of the skin flaps bilaterally. The drains were dressed with BioPatches and Tegaderm dressings. Symmetry, as assessed on the table, was acceptable. She was awakened from general anesthesia and extubated without incident. All sponge, instrument and needle counts were correct at the conclusion of the procedure. Patient tolerated the procedure well with no known complications. Surgical wounds  were dressed with telfa, ABD and loose-fitting surgical bra.  The advanced practice practitioner (APP) assisted throughout the case.  The APP was essential in retraction and counter traction when needed to make the case progress smoothly.  This retraction and assistance made it  possible to see the tissue plans for the procedure.  The assistance was needed for blood control, tissue re-approximation and assisted with closure of the incision site.  Tema Alire M. Bynum Mccullars, MD [facility]

## 2023-10-02 DIAGNOSIS — N6021 Fibroadenosis of right breast: Secondary | ICD-10-CM | POA: Diagnosis not present

## 2023-10-02 DIAGNOSIS — N6089 Other benign mammary dysplasias of unspecified breast: Secondary | ICD-10-CM | POA: Diagnosis not present

## 2023-10-02 DIAGNOSIS — Z91040 Latex allergy status: Secondary | ICD-10-CM | POA: Diagnosis not present

## 2023-10-02 DIAGNOSIS — C50912 Malignant neoplasm of unspecified site of left female breast: Secondary | ICD-10-CM | POA: Diagnosis not present

## 2023-10-02 DIAGNOSIS — N6001 Solitary cyst of right breast: Secondary | ICD-10-CM | POA: Diagnosis not present

## 2023-10-02 MED ORDER — ALPRAZOLAM 0.25 MG PO TABS: 0.2500 mg | ORAL_TABLET | Freq: Every day | ORAL | 0 refills | Status: AC | PRN

## 2023-10-02 MED ORDER — DOXYCYCLINE HYCLATE 100 MG PO TABS: 100.0000 mg | ORAL_TABLET | Freq: Two times a day (BID) | ORAL | 0 refills | Status: AC

## 2023-10-02 NOTE — Anesthesia Postprocedure Evaluation (Signed)
 Anesthesia Post Note  Patient: Tonya Andrews  Procedure(s) Performed: MASTECTOMY, SIMPLE (Bilateral: Breast) BIOPSY, LYMPH NODE, SENTINEL (Left: Axilla) INSERTION, TUNNELED CENTRAL VENOUS DEVICE, WITH PORT (Chest) INSERTION, TISSUE EXPANDER (Bilateral: Chest)     Patient location during evaluation: PACU Anesthesia Type: General and Regional Level of consciousness: awake and alert Pain management: pain level controlled Vital Signs Assessment: post-procedure vital signs reviewed and stable Respiratory status: spontaneous breathing, nonlabored ventilation, respiratory function stable and patient connected to nasal cannula oxygen Cardiovascular status: blood pressure returned to baseline and stable Postop Assessment: no apparent nausea or vomiting Anesthetic complications: no   No notable events documented.  Last Vitals:  Vitals:   10/02/23 0315 10/02/23 0700  BP: 105/63 92/69  Pulse: 64 86  Resp: 18 16  Temp: 36.7 C 37.1 C  SpO2: 97% 96%    Last Pain:  Vitals:   10/02/23 0700  TempSrc:   PainSc: 3                  Lexie Koehl L Abril Cappiello

## 2023-10-02 NOTE — Discharge Summary (Signed)
 Physician Discharge Summary  Patient ID: BRIN RUGGERIO MRN: [id number] DOB/AGE: August 22, 1975 48 y.o.  Admit date: 10/01/2023 Discharge date: 10/02/2023  Admission Diagnoses:  Discharge Diagnoses:  Principal Problem:   Breast cancer [facility])   Discharged Condition: good  Hospital Course: Uncomplicated  Consults: general surgery  Significant Diagnostic Studies: None  Treatments: Bilateral mastectomies with tissue expander placement.   Discharge Exam: Blood pressure 92/69, pulse 86, temperature 98.7 F (37.1 C), resp. rate 16, height 5' 5 (1.651 m), weight 80.3 kg, last menstrual period 09/17/2023, SpO2 96%. RN and PA as chaperone Bilateral breast skin flaps with good color and perfusion, no areas of concern.  Disposition: Discharge disposition: 01-Home or Self Care      Discharge Instructions     Call MD for:   Complete by: As directed    Call MD for:  difficulty breathing, headache or visual disturbances   Complete by: As directed    Call MD for:  extreme fatigue   Complete by: As directed    Call MD for:  hives   Complete by: As directed    Call MD for:  persistant dizziness or light-headedness   Complete by: As directed    Call MD for:  persistant nausea and vomiting   Complete by: As directed    Call MD for:  redness, tenderness, or signs of infection (pain, swelling, redness, odor or green/yellow discharge around incision site)   Complete by: As directed    Call MD for:  severe uncontrolled pain   Complete by: As directed    Call MD for:  temperature >100.4   Complete by: As directed    Diet general   Complete by: As directed    Increase activity slowly   Complete by: As directed    No wound care   Complete by: As directed       Allergies as of 10/02/2023       Reactions   Amoxicillin Nausea And Vomiting   Does not ever want again. Made her feel bad.    Nifedipine Other (See Comments)   Rho D Immune Globulin Other (See Comments)   Latex Other (See  Comments)   Unknown to patient, happened during surgery        Medication List     TAKE these medications    acetaminophen  500 MG tablet Commonly known as: TYLENOL  Take 1 tablet (500 mg total) by mouth every 6 (six) hours.   albuterol  108 (90 Base) MCG/ACT inhaler Commonly known as: VENTOLIN  HFA Inhale 1-2 puffs into the lungs every 6 (six) hours as needed for wheezing or shortness of breath.   ALPRAZolam  0.25 MG tablet Commonly known as: Xanax  Take 1 tablet (0.25 mg total) by mouth daily as needed for up to 3 days for anxiety.   cetirizine  10 MG tablet Commonly known as: ZYRTEC  Take 10 mg by mouth daily.   doxycycline  100 MG tablet Commonly known as: VIBRA -TABS Take 1 tablet (100 mg total) by mouth 2 (two) times daily for 14 days.   DULoxetine  30 MG capsule Commonly known as: CYMBALTA  Take 30-60 mg by mouth See admin instructions. Take 30 mg in the morning and 60 mg at bedtime   Emgality 120 MG/ML Soaj Generic drug: Galcanezumab-gnlm Inject 120 mg into the skin every 30 (thirty) days.   fluticasone  50 MCG/ACT nasal spray Commonly known as: FLONASE  Place 2 sprays into both nostrils daily for 7 days.   gabapentin  300 MG capsule Commonly known as: NEURONTIN  Take  1 capsule (300 mg total) by mouth 2 (two) times daily.   ibuprofen  600 MG tablet Commonly known as: ADVIL  Take 1 tablet (600 mg total) by mouth every 8 (eight) hours as needed.   IRON PO Take 1 tablet by mouth daily. Gummy   methocarbamol  750 MG tablet Commonly known as: ROBAXIN  Take 750 mg by mouth.   multivitamin with minerals tablet Take 1 tablet by mouth daily. Gummy   omeprazole 20 MG capsule Commonly known as: PRILOSEC Take 20 mg by mouth 2 (two) times daily before a meal. What changed: additional instructions   PROBIOTIC PO Take 1 tablet by mouth daily. 30 billion   Vitamin B-12 5000 MCG Tbdp Take 5,000 mcg by mouth daily.        Follow-up Information     Hamad Whyte  M, MD Follow up in 1 week(s).   Specialty: Plastic Surgery Contact information: [address], [address] [place] [address] [phone]                Dr. Damont Balles [address] , [place]  [address] [phone]  Signed: Nevia Henkin M Kiowa Peifer 10/02/2023, 8:35 AM

## 2023-10-05 ENCOUNTER — Encounter: Payer: Self-pay | Admitting: *Deleted

## 2023-10-05 ENCOUNTER — Encounter (HOSPITAL_BASED_OUTPATIENT_CLINIC_OR_DEPARTMENT_OTHER): Payer: Self-pay | Admitting: Surgery

## 2023-10-05 LAB — SURGICAL PATHOLOGY

## 2023-10-11 ENCOUNTER — Inpatient Hospital Stay: Attending: Hematology and Oncology | Admitting: Hematology and Oncology

## 2023-10-11 VITALS — BP 105/78 | HR 105 | Temp 97.9°F | Resp 18 | Ht 65.0 in | Wt 176.4 lb

## 2023-10-11 DIAGNOSIS — Z1722 Progesterone receptor negative status: Secondary | ICD-10-CM | POA: Insufficient documentation

## 2023-10-11 DIAGNOSIS — Z9013 Acquired absence of bilateral breasts and nipples: Secondary | ICD-10-CM | POA: Insufficient documentation

## 2023-10-11 DIAGNOSIS — Z79899 Other long term (current) drug therapy: Secondary | ICD-10-CM | POA: Insufficient documentation

## 2023-10-11 DIAGNOSIS — Z171 Estrogen receptor negative status [ER-]: Secondary | ICD-10-CM | POA: Insufficient documentation

## 2023-10-11 DIAGNOSIS — Z1732 Human epidermal growth factor receptor 2 negative status: Secondary | ICD-10-CM | POA: Diagnosis not present

## 2023-10-11 DIAGNOSIS — C50212 Malignant neoplasm of upper-inner quadrant of left female breast: Secondary | ICD-10-CM | POA: Insufficient documentation

## 2023-10-11 MED ORDER — LIDOCAINE-PRILOCAINE 2.5-2.5 % EX CREA: TOPICAL_CREAM | CUTANEOUS | 3 refills | Status: AC

## 2023-10-11 MED ORDER — DEXAMETHASONE 4 MG PO TABS: ORAL_TABLET | ORAL | 0 refills | Status: DC

## 2023-10-11 MED ORDER — PROCHLORPERAZINE MALEATE 10 MG PO TABS: 10.0000 mg | ORAL_TABLET | Freq: Four times a day (QID) | ORAL | 1 refills | Status: AC | PRN

## 2023-10-11 MED ORDER — ONDANSETRON HCL 8 MG PO TABS: 8.0000 mg | ORAL_TABLET | Freq: Three times a day (TID) | ORAL | 1 refills | Status: AC | PRN

## 2023-10-11 NOTE — Progress Notes (Signed)
 DISCONTINUE ON PATHWAY REGIMEN - Breast  No Medical Intervention - Off Treatment.  PRIOR TREATMENT: [id number]: Referral to Surgery  START ON PATHWAY REGIMEN - Breast     A cycle is every 21 days:     Cyclophosphamide      Docetaxel   **Always confirm dose/schedule in your pharmacy ordering system**  Patient Characteristics: Postoperative without Neoadjuvant Therapy, M0 (Pathologic Staging), Invasive Disease, Adjuvant Therapy, HER2 Negative, ER Negative, Node Negative, pT1a-b, N0, Chemotherapy Indicated Therapeutic Status: Postoperative without Neoadjuvant Therapy, M0 (Pathologic Staging) AJCC Grade: G3 AJCC N Category: pN0 AJCC M Category: cM0 ER Status: Negative (-) AJCC 8 Stage Grouping: IB HER2 Status: Negative (-) Oncotype Dx Recurrence Score: Not Appropriate AJCC T Category: pT1b PR Status: Negative (-) Intervention Indicated: Chemotherapy Intent of Therapy: Curative Intent, Discussed with Patient

## 2023-10-11 NOTE — Assessment & Plan Note (Signed)
 10/01/2023:Bilateral mastectomies Left mastectomy: Grade 3 IDC 0.7 cm, margins negative, 0/3 lymph nodes negative, ER 0%, PR 0%, HER2 -1+, Ki-67 40% Right mastectomy: Benign  Pathology counseling: I discussed the final pathology report of the patient provided  a copy of this report. I discussed the margins as well as lymph node surgeries. We also discussed the final staging along with previously performed ER/PR and HER-2/neu testing.  Treatment plan: Adjuvant chemotherapy with Taxotere and Cytoxan every 3 weeks x 4 Adjuvant radiation therapy  Return to clinic in 3 weeks to start chemotherapy

## 2023-10-11 NOTE — Progress Notes (Signed)
 Patient Care Team: Verena Mems, MD as PCP - General (Family Medicine) Lonni Slain, MD as PCP - Cardiology (Cardiology) Tyree Nanetta SAILOR, RN as Oncology Nurse Navigator Gerome, Devere HERO, RN as Registered Nurse Odean Potts, MD as Consulting Physician (Hematology and Oncology) Vernetta Berg, MD as Consulting Physician (General Surgery)  DIAGNOSIS:  Encounter Diagnosis  Name Primary?   Malignant neoplasm of upper-inner quadrant of left breast in female, estrogen receptor negative (HCC) Yes    SUMMARY OF ONCOLOGIC HISTORY: Oncology History  Malignant neoplasm of upper-inner quadrant of left breast in female, estrogen receptor negative (HCC)  09/01/2023 Initial Diagnosis   Malignant neoplasm of upper-inner quadrant of left breast in female, estrogen receptor negative (HCC)   10/01/2023 Surgery   Bilateral mastectomies Left mastectomy: Grade 3 IDC 0.7 cm, margins negative, 0/3 lymph nodes negative, ER 0%, PR 0%, HER2 -1+, Ki-67 40% Right mastectomy: Benign     CHIEF COMPLIANT: Follow-up after surgery to discuss pathology report  HISTORY OF PRESENT ILLNESS:  History of Present Illness Tonya Andrews is a 48 year old female with breast cancer who presents for post-surgical follow-up and chemotherapy planning.  She underwent surgery on October 01, 2023, for breast cancer, resulting in negative margins and staging as 1B. The tumor is triple negative, and three lymph nodes removed show no evidence of cancer. The right side is clear of cancer.  She experiences pain around the surgical drains, which are still in place, affecting her sleep and mobility. Drainage is expected to decrease over the next week.  She is scheduled to start chemotherapy with Taxotere and Cytoxan every three weeks. She is concerned about hair loss during treatment and plans to review information on hair care during chemotherapy.  She feels irritable and has gum irritation.     ALLERGIES:  is  allergic to amoxicillin, nifedipine, rho d immune globulin, and latex.  MEDICATIONS:  Current Outpatient Medications  Medication Sig Dispense Refill   acetaminophen  (TYLENOL ) 500 MG tablet Take 1 tablet (500 mg total) by mouth every 6 (six) hours. 30 tablet 0   albuterol  (VENTOLIN  HFA) 108 (90 Base) MCG/ACT inhaler Inhale 1-2 puffs into the lungs every 6 (six) hours as needed for wheezing or shortness of breath. 8.5 g 0   cetirizine  (ZYRTEC ) 10 MG tablet Take 10 mg by mouth daily.     Cyanocobalamin  (VITAMIN B-12) 5000 MCG TBDP Take 5,000 mcg by mouth daily.     doxycycline  (VIBRA -TABS) 100 MG tablet Take 1 tablet (100 mg total) by mouth 2 (two) times daily for 14 days. 28 tablet 0   DULoxetine  (CYMBALTA ) 30 MG capsule Take 30-60 mg by mouth See admin instructions. Take 30 mg in the morning and 60 mg at bedtime     EMGALITY 120 MG/ML SOAJ Inject 120 mg into the skin every 30 (thirty) days.     Ferrous Sulfate (IRON PO) Take 1 tablet by mouth daily. Gummy     fluticasone  (FLONASE ) 50 MCG/ACT nasal spray Place 2 sprays into both nostrils daily for 7 days. 16 g 2   gabapentin  (NEURONTIN ) 300 MG capsule Take 1 capsule (300 mg total) by mouth 2 (two) times daily. 14 capsule 0   ibuprofen  (ADVIL ) 600 MG tablet Take 1 tablet (600 mg total) by mouth every 8 (eight) hours as needed. 30 tablet 0   methocarbamol  (ROBAXIN ) 750 MG tablet Take 750 mg by mouth.     Multiple Vitamins-Minerals (MULTIVITAMIN WITH MINERALS) tablet Take 1 tablet by mouth daily. Gummy  omeprazole (PRILOSEC) 20 MG capsule Take 20 mg by mouth 2 (two) times daily before a meal.     Probiotic Product (PROBIOTIC PO) Take 1 tablet by mouth daily. 30 billion     No current facility-administered medications for this visit.    PHYSICAL EXAMINATION: ECOG PERFORMANCE STATUS: 1 - Symptomatic but completely ambulatory  Vitals:   10/11/23 1100  BP: 105/78  Pulse: (!) 105  Resp: 18  Temp: 97.9 F (36.6 C)  SpO2: 99%   Filed  Weights   10/11/23 1100  Weight: 176 lb 6.4 oz (80 kg)      LABORATORY DATA:  I have reviewed the data as listed    Latest Ref Rng & Units 06/11/2023    9:31 AM 01/22/2023   11:18 AM 08/11/2022   12:29 AM  CMP  Glucose 70 - 99 mg/dL 84  86  893   BUN 6 - 20 mg/dL 10  7  10    Creatinine 0.44 - 1.00 mg/dL 9.38  9.28  9.21   Sodium 135 - 145 mmol/L 136  139  137   Potassium 3.5 - 5.1 mmol/L 4.0  3.9  3.9   Chloride 98 - 111 mmol/L 103  103  105   CO2 22 - 32 mmol/L 28  20  25    Calcium 8.9 - 10.3 mg/dL 9.2  9.3  9.1   Total Protein 6.5 - 8.1 g/dL   7.3   Total Bilirubin 0.3 - 1.2 mg/dL   0.3   Alkaline Phos 38 - 126 U/L   60   AST 15 - 41 U/L   34   ALT 0 - 44 U/L   41     Lab Results  Component Value Date   WBC 6.0 06/11/2023   HGB 14.0 06/11/2023   HCT 41.7 06/11/2023   MCV 94.8 06/11/2023   PLT 300 06/11/2023   NEUTROABS 4.5 08/11/2022    ASSESSMENT & PLAN:  Malignant neoplasm of upper-inner quadrant of left breast in female, estrogen receptor negative (HCC) 10/01/2023:Bilateral mastectomies Left mastectomy: Grade 3 IDC 0.7 cm, margins negative, 0/3 lymph nodes negative, ER 0%, PR 0%, HER2 -1+, Ki-67 40% Right mastectomy: Benign  Pathology counseling: I discussed the final pathology report of the patient provided  a copy of this report. I discussed the margins as well as lymph node surgeries. We also discussed the final staging along with previously performed ER/PR and HER-2/neu testing.  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 Three lymph nodes tested negative, and the  right breast was benign. Surgery is complete, and the final stage is confirmed as 1B. - Initiate chemotherapy regimen with Taxotere and Cytoxan, discussed potential side effects such as neuropathy. - Recommend compression gloves and 20-30 mmHg compression socks to mitigate neuropathy risk. - Advise cold therapy as a preventive measure for neuropathy. - Refer to the [employer] website for hair care guidance to minimize hair loss. - Schedule chemotherapy to start on October 7th, with treatments every three weeks. - Arrange a chemo class with a nurse and pharmacist to discuss treatment details, dietary recommendations, and medication management. - Schedule a follow-up  appointment one week after the first treatment to monitor blood counts and adjust dosage if necessary.      No orders of the defined types were placed in this encounter.  The patient has a good understanding of the overall plan. she agrees with it. she will call with any problems that may develop before the next visit here. Total time spent: 45 mins including face to face time and time spent for planning, charting and co-ordination of care   Naomi MARLA Chad, MD 10/11/23

## 2023-10-12 ENCOUNTER — Encounter: Payer: Self-pay | Admitting: *Deleted

## 2023-10-12 ENCOUNTER — Ambulatory Visit (INDEPENDENT_AMBULATORY_CARE_PROVIDER_SITE_OTHER)

## 2023-10-12 ENCOUNTER — Telehealth: Payer: Self-pay

## 2023-10-12 ENCOUNTER — Other Ambulatory Visit: Payer: Self-pay

## 2023-10-12 ENCOUNTER — Telehealth: Payer: Self-pay | Admitting: *Deleted

## 2023-10-12 VITALS — BP 112/83 | HR 108 | Ht 65.0 in | Wt 174.4 lb

## 2023-10-12 DIAGNOSIS — Z9889 Other specified postprocedural states: Secondary | ICD-10-CM

## 2023-10-12 NOTE — Telephone Encounter (Signed)
 Left message for return phone call to inquire if patient had all necessary information regarding cold cap and if she would using this for chemo.

## 2023-10-12 NOTE — Progress Notes (Signed)
   Established Patient Office Visit  Subjective   Patient ID: Tonya Andrews, female    DOB: 09/22/1975  Age: 48 y.o. MRN: [id number]  Chief Complaint  Patient presents with   Post-op Follow-up    HPI Patient is a 48 yo F with history of breast cancer who underwent bilateral mastectomies followed by subpectoral tissue expander reconstruction on 10/01/2023. Patient is having expected pain controlled with current pain regimen. All drains draining over 30 cc, yet output seems to be decreasing. Incisions C/D/I.   Patient Active Problem List   Diagnosis Date Noted   Breast cancer (HCC) 10/01/2023   Hospice care patient 09/17/2023   Genetic testing 09/17/2023   Malignant neoplasm of upper-inner quadrant of left breast in female, estrogen receptor negative (HCC) 09/01/2023   Primary localized osteoarthritis of right knee 06/15/2023   Gastrointestinal complaints 02/25/2022   Dietary counseling and surveillance 02/25/2022   Gastroesophageal reflux disease 02/25/2022   Other allergic rhinitis 02/25/2022   Reactive airway disease 02/25/2022   Excessive daytime sleepiness 09/17/2021   Cholelithiasis without obstruction 08/18/2021   Fibromyalgia 08/18/2021   Paresthesia of arm 08/18/2021   Twitching 08/18/2021   Vitamin B12 deficiency (non anemic) 08/18/2021   Cerebrovascular accident (HCC) 04/18/2020   PFO (patent foramen ovale) 05/03/2009   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 Reinforced today. JP drains with serous output. Dressings changed today. We placed injectable saline in the Expander using a sterile technique: Right: 50 cc for a total of 50 cc Left: 50 cc for a total of 50 cc  No results found for any visits on 10/12/23.  Last CBC Lab Results  Component Value Date   WBC 6.0 06/11/2023   HGB 14.0 06/11/2023   HCT 41.7 06/11/2023   MCV 94.8 06/11/2023   MCH 31.8 06/11/2023   RDW 12.4 06/11/2023   PLT 300 06/11/2023   The ASCVD Risk score (Arnett DK, et al., 2019) failed to calculate for the following reasons:   Risk score cannot be calculated because patient has a medical history suggesting prior/existing ASCVD    Assessment & Plan:   Problem List Items Addressed This Visit   None Visit Diagnoses       S/P breast reconstruction    -  Primary     Postoperative course as expected. Continue JP drais until they drain <30 cc for 3 consecutive days. Continue current pain medication protocol. Will refill gabapentin  for patient. Postoperative pictures obtained. Tolerated today's expansion well. Follow up in 1 week for further expansion.   Hiyab Nhem M. Jibril Mcminn, MD [facility]

## 2023-10-12 NOTE — Telephone Encounter (Signed)
 Pt is asking when her medication would be ordered for her. Pt visit note from today mentions gabapentin . Thank you

## 2023-10-13 MED ORDER — GABAPENTIN 300 MG PO CAPS: 300.0000 mg | ORAL_CAPSULE | Freq: Two times a day (BID) | ORAL | 0 refills | Status: DC

## 2023-10-13 NOTE — Addendum Note (Signed)
 Addended by: EUSTACIO POUR on: 10/13/2023 10:19 AM   Modules accepted: Orders

## 2023-10-19 ENCOUNTER — Other Ambulatory Visit: Payer: Self-pay | Admitting: Hematology and Oncology

## 2023-10-19 ENCOUNTER — Ambulatory Visit

## 2023-10-19 VITALS — BP 113/78 | HR 100

## 2023-10-19 DIAGNOSIS — C50212 Malignant neoplasm of upper-inner quadrant of left female breast: Secondary | ICD-10-CM

## 2023-10-19 DIAGNOSIS — Z09 Encounter for follow-up examination after completed treatment for conditions other than malignant neoplasm: Secondary | ICD-10-CM

## 2023-10-19 MED ORDER — GABAPENTIN 300 MG PO CAPS: 300.0000 mg | ORAL_CAPSULE | Freq: Two times a day (BID) | ORAL | 0 refills | Status: DC

## 2023-10-19 NOTE — Progress Notes (Signed)
   Established Patient Office Visit  Subjective   Patient ID: Tonya Andrews, female    DOB: 1975-06-08  Age: 48 y.o. MRN: [id number]  Chief Complaint  Patient presents with   Post-op Follow-up    HPI  Patient is a 48 yo F with history of breast cancer who underwent bilateral mastectomies followed by subpectoral tissue expander reconstruction on 10/01/2023.Patient is having expected pain controlled with current pain regimen. Right drain draining over 30 cc/day for the last 3 days, left drains with <30 cc/day for the last 3 days. Incisions C/D/I.   ROS ROS negative except as noted in HPI    Objective:     BP 113/78 (BP Location: Right Arm, Patient Position: Sitting, Cuff Size: Normal)   Pulse 100   LMP 09/17/2023 (Approximate)   SpO2 98%  BP Readings from Last 3 Encounters:  10/19/23 113/78  10/12/23 112/83  10/11/23 105/78      Physical Exam Steri-strips clean and dry. Reinforced today. JP drains with serous output. Dressings changed today on the right.. Jp's on the left removed today. We placed injectable saline in the Expander using a sterile technique: Right: 50 cc for a total of 100 cc Left: 50 cc for a total of 100 cc  Last CBC Lab Results  Component Value Date   WBC 6.0 06/11/2023   HGB 14.0 06/11/2023   HCT 41.7 06/11/2023   MCV 94.8 06/11/2023   MCH 31.8 06/11/2023   RDW 12.4 06/11/2023   PLT 300 06/11/2023   Last metabolic panel Lab Results  Component Value Date   GLUCOSE 84 06/11/2023   NA 136 06/11/2023   K 4.0 06/11/2023   CL 103 06/11/2023   CO2 28 06/11/2023   BUN 10 06/11/2023   CREATININE 0.61 06/11/2023   GFRNONAA >60 06/11/2023   CALCIUM 9.2 06/11/2023   PHOS 3.0 04/16/2009   PROT 7.3 08/11/2022   ALBUMIN  3.6 08/11/2022   BILITOT 0.3 08/11/2022   ALKPHOS 60 08/11/2022   AST 34 08/11/2022   ALT 41 08/11/2022   ANIONGAP 5 06/11/2023      The ASCVD Risk score (Arnett DK, et al., 2019) failed to calculate for the following  reasons:   Risk score cannot be calculated because patient has a medical history suggesting prior/existing ASCVD    Assessment & Plan:   Problem List Items Addressed This Visit   None Visit Diagnoses       Follow-up exam    -  Primary      Postoperative course as expected. Right JP drain to continue until it drains <30 cc for 3 consecutive days. Continue current pain medication protocol. Will refill gabapentin  for patient. Postoperative pictures obtained. Tolerated today's expansion well. Follow up in 1 week for further expansion.   Caliyah Sieh M. Melchizedek Espinola, MD [facility]

## 2023-10-20 ENCOUNTER — Other Ambulatory Visit: Payer: Self-pay

## 2023-10-21 ENCOUNTER — Encounter: Payer: Self-pay | Admitting: *Deleted

## 2023-10-21 ENCOUNTER — Other Ambulatory Visit: Payer: Self-pay

## 2023-10-22 ENCOUNTER — Encounter: Admitting: Physician Assistant

## 2023-10-25 ENCOUNTER — Ambulatory Visit (INDEPENDENT_AMBULATORY_CARE_PROVIDER_SITE_OTHER)

## 2023-10-25 ENCOUNTER — Encounter: Admitting: Student

## 2023-10-25 VITALS — BP 112/80 | HR 85

## 2023-10-25 DIAGNOSIS — Z09 Encounter for follow-up examination after completed treatment for conditions other than malignant neoplasm: Secondary | ICD-10-CM

## 2023-10-25 NOTE — Progress Notes (Signed)
   Established Patient Office Visit  Subjective   Patient ID: Tonya Andrews, female    DOB: [birth year]  Age: 48 y.o. MRN: [id number]  Chief Complaint  Patient presents with   Post-op Follow-up    HPI  Patient is a 48 yo F with history of breast cancer who underwent bilateral mastectomies followed by subpectoral tissue expander reconstruction on 10/01/2023.Patient is pain is controlled and almost resolved. Right drain < 10 cc/day for the last 3 days. Steri strips removed. Incisions C/D/I.       Objective:     BP 112/80 (BP Location: Right Arm, Patient Position: Sitting, Cuff Size: Normal)   Pulse 85   LMP 09/17/2023 (Approximate)   SpO2 98%  BP Readings from Last 3 Encounters:  10/25/23 112/80  10/19/23 113/78  10/12/23 112/83      Physical Exam Steri-strips clean and dry. Removed today. Right JP drain with serous output. Jp's on the right removed today. We placed injectable saline in the Expander using a sterile technique: Right: 50 cc for a total of 150 cc Left: 50 cc for a total of 150 cc  Last CBC Lab Results  Component Value Date   WBC 6.0 06/11/2023   HGB 14.0 06/11/2023   HCT 41.7 06/11/2023   MCV 94.8 06/11/2023   MCH 31.8 06/11/2023   RDW 12.4 06/11/2023   PLT 300 06/11/2023    The ASCVD Risk score (Arnett DK, et al., 2019) failed to calculate for the following reasons:   Risk score cannot be calculated because patient has a medical history suggesting prior/existing ASCVD    Assessment & Plan:   Postoperative course as expected. Right JP drain removed today. Continue current pain medication protocol as needed. Tolerated today's expansion well.  Follow up in 1 week for further expansion.    Tonya Andrews M. Alydia Gosser, MD [facility]

## 2023-11-01 ENCOUNTER — Ambulatory Visit (INDEPENDENT_AMBULATORY_CARE_PROVIDER_SITE_OTHER)

## 2023-11-01 VITALS — BP 108/75 | HR 90

## 2023-11-01 DIAGNOSIS — Z9889 Other specified postprocedural states: Secondary | ICD-10-CM

## 2023-11-01 DIAGNOSIS — Z09 Encounter for follow-up examination after completed treatment for conditions other than malignant neoplasm: Secondary | ICD-10-CM

## 2023-11-01 MED ORDER — GABAPENTIN 300 MG PO CAPS: 300.0000 mg | ORAL_CAPSULE | Freq: Two times a day (BID) | ORAL | 0 refills | Status: DC

## 2023-11-01 NOTE — Progress Notes (Signed)
 Pharmacist Chemotherapy Monitoring - Initial Assessment    Anticipated start date: 11/09/23   The following has been reviewed per standard work regarding the patient's treatment regimen: The patient's diagnosis, treatment plan and drug doses, and organ/hematologic function Lab orders and baseline tests specific to treatment regimen  The treatment plan start date, drug sequencing, and pre-medications Prior authorization status  Patient's documented medication list, including drug-drug interaction screen and prescriptions for anti-emetics and supportive care specific to the treatment regimen The drug concentrations, fluid compatibility, administration routes, and timing of the medications to be used The patient's access for treatment and lifetime cumulative dose history, if applicable  The patient's medication allergies and previous infusion related reactions, if applicable   Changes made to treatment plan:  N/A  Follow up needed:  Pending authorization for treatment   Harlene JONELLE Nasuti, RPH, 11/01/2023  3:37 PM

## 2023-11-01 NOTE — Therapy (Signed)
 OUTPATIENT PHYSICAL THERAPY BREAST CANCER POST OP FOLLOW UP   Patient Name: Tonya Andrews MRN: [id number] DOB:Dec 10, 1975, 48 y.o., female Today's Date: 11/02/2023  END OF SESSION:  PT End of Session - 11/02/23 0925     Visit Number 2    Number of Visits 2    PT Start Time 0900    PT Stop Time 0925    PT Time Calculation (min) 25 min    Activity Tolerance Patient tolerated treatment well    Behavior During Therapy [facility] for tasks assessed/performed          Past Medical History:  Diagnosis Date   Arthritis    Celiac disease    Fibromyalgia    Gallstones    GERD (gastroesophageal reflux disease)    Migraine headache    Dr Oneita   Nasal polyp    Nerve pain    PFO (patent foramen ovale) 04/2009   by bubble study/TEE, Dr Wonda, cardiology   PONV (postoperative nausea and vomiting)    Seasonal allergies    Stroke (HCC) 04/2009, 07/2009   Dr Rosemarie right arm weakness   Past Surgical History:  Procedure Laterality Date   BREAST BIOPSY Left 08/19/2023   US  LT BREAST BX W LOC DEV 1ST LESION IMG BX SPEC US  GUIDE 08/19/2023 [facility] MAMMOGRAPHY   CHOLECYSTECTOMY N/A 05/30/2019   Procedure: LAPAROSCOPIC CHOLECYSTECTOMY;  Surgeon: Tanda Locus, MD;  Location: [facility];  Service: General;  Laterality: N/A;   HARDWARE REMOVAL Right 06/15/2023   Procedure: REMOVAL, HARDWARE;  Surgeon: Sheril Coy, MD;  Location: [facility];  Service: Orthopedics;  Laterality: Right;   KNEE SURGERY Right    x2   PATENT FORAMEN OVALE(PFO) CLOSURE  09/2009   transcath closure    PORTACATH PLACEMENT N/A 10/01/2023   Procedure: INSERTION, TUNNELED CENTRAL VENOUS DEVICE, WITH PORT;  Surgeon: Vernetta Berg, MD;  Location: [facility];  Service: General;  Laterality: N/A;  PORT-A-CATH INSERTION WITH ULTRASOUND GUIDANCE   SENTINEL NODE BIOPSY Left 10/01/2023   Procedure: BIOPSY, LYMPH NODE, SENTINEL;  Surgeon: Vernetta Berg, MD;  Location: [facility];  Service: General;   Laterality: Left;   TISSUE EXPANDER PLACEMENT Bilateral 10/01/2023   Procedure: INSERTION, TISSUE EXPANDER;  Surgeon: Montorfano, Lisandro M, MD;  Location: [facility];  Service: Plastics;  Laterality: Bilateral;  WITH ACELLULAR DERMAL MATRIX   TOTAL KNEE ARTHROPLASTY Right 06/15/2023   Procedure: ARTHROPLASTY, KNEE, TOTAL;  Surgeon: Sheril Coy, MD;  Location: [facility];  Service: Orthopedics;  Laterality: Right;   TOTAL MASTECTOMY Bilateral 10/01/2023   Procedure: MASTECTOMY, SIMPLE;  Surgeon: Vernetta Berg, MD;  Location: [facility];  Service: General;  Laterality: Bilateral;   WISDOM TOOTH EXTRACTION     Patient Active Problem List   Diagnosis Date Noted   Breast cancer (HCC) 10/01/2023   Hospice care patient 09/17/2023   Genetic testing 09/17/2023   Malignant neoplasm of upper-inner quadrant of left breast in female, estrogen receptor negative (HCC) 09/01/2023   Primary localized osteoarthritis of right knee 06/15/2023   Gastrointestinal complaints 02/25/2022   Dietary counseling and surveillance 02/25/2022   Gastroesophageal reflux disease 02/25/2022   Other allergic rhinitis 02/25/2022   Reactive airway disease 02/25/2022   Excessive daytime sleepiness 09/17/2021   Cholelithiasis without obstruction 08/18/2021   Fibromyalgia 08/18/2021   Paresthesia of arm 08/18/2021   Twitching 08/18/2021   Vitamin B12 deficiency (non anemic) 08/18/2021   Cerebrovascular accident (HCC) 04/18/2020   PFO (patent foramen ovale) 05/03/2009  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   History of cardioembolic cerebrovascular accident (CVA) 05/02/2009   Migraine 05/02/2009    REFERRING PROVIDER: Reena Duck, MD  REFERRING DIAG: Diagnosis [phone] (ICD-10-CM) - Malignant neoplasm of unspecified site of left female breast  THERAPY DIAG:  Invasive ductal carcinoma of left breast (HCC)  Abnormal posture  Aftercare following surgery for neoplasm  At risk  for lymphedema  Rationale for Evaluation and Treatment: Rehabilitation  ONSET DATE: 08/22/23  SUBJECTIVE:                                                                                                                                                                                           SUBJECTIVE STATEMENT: I start chemotherapy next Tuesday.  I just had a fill yesterday so its a bit sore.  No compression required.    PERTINENT HISTORY:  Post bilateral mastectomy on 10/01/23 with expander placement. 3 negative nodes removed on the Lt.  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 Rt TKR 06/27/23. Hx of 2 strokes with Rt sided weakness. Hx of hole in the heart. Fibromyalgia.   PATIENT GOALS:  Reassess how my recovery is going related to arm function, pain, and swelling.  PAIN:  Are you having pain? Yes: NPRS scale: 3/10 Pain location: chest and under the left arm  Pain description: sore from the fill and some of the stitches   Aggravating factors: sudden movements  Relieving factors: advil  and gabapentin    PRECAUTIONS: Recent Surgery, left UE Lymphedema risk, None  RED FLAGS: None   ACTIVITY LEVEL / LEISURE: ADLs have returned to normal.     OBJECTIVE:   PATIENT SURVEYS:  QUICK DASH: 25% From 18.81% at baseline   OBSERVATIONS: Not supposed to wear compression   POSTURE:  WNL  LYMPHEDEMA ASSESSMENT:   UPPER EXTREMITY AROM/PROM:   A/PROM RIGHT   eval   11/02/23  Shoulder extension 60 75  Shoulder flexion 164 168  Shoulder abduction 165 172  Shoulder internal rotation 70   Shoulder external rotation 80 110                          (Blank rows = not tested)   A/PROM LEFT   eval 11/02/23  Shoulder extension 60 67  Shoulder flexion 163 160  Shoulder abduction 164 169  Shoulder internal rotation 70   Shoulder external rotation 81 110                          (Blank rows = not tested)   CERVICAL AROM: All within normal limits:  UPPER EXTREMITY  STRENGTH: 4/5 bil without pain    LYMPHEDEMA ASSESSMENTS (in cm):    LANDMARK RIGHT   eval  10 cm proximal to olecranon process 29.5  Olecranon process 25.5  10 cm proximal to ulnar styloid process 18.9  Just proximal to ulnar styloid process 15.7  Across hand at thumb web space 18.3  At base of 2nd digit 6.3  (Blank rows = not tested)   LANDMARK LEFT   eval 11/02/23  10 cm proximal to olecranon process 29.5 29.1  Olecranon process 26.8 26.4  10 cm proximal to ulnar styloid process 19.3 19.3  Just proximal to ulnar styloid process 15.8 15.7  Across hand at thumb web space 18.3 18.1  At base of 2nd digit 6.0 6.3  (Blank rows = not tested)   PATIENT EDUCATION:  Education details: per post op instruction section below  Person educated: Patient Education method: Programmer, multimedia, Demonstration, and Handouts Education comprehension: verbalized understanding and returned demonstration  HOME EXERCISE PROGRAM: Reviewed previously given post op HEP.   ASSESSMENT:  CLINICAL IMPRESSION: Pt is doing very well after bil mastectomy with expanders.  She has returned to baseline AROM and daily activities.  She will continue with SOZO screenings.    Pt will benefit from skilled therapeutic intervention to improve on the following deficits: Decreased knowledge of precautions, impaired UE functional use, pain, decreased ROM, postural dysfunction.   PT treatment/interventions: ADL/Self care home management, [phone]- Self Care and Patient/Family education   GOALS: Goals reviewed with patient? Yes  GOALS MET AT EVAL:  GOALS Name Target Date Goal status  1 Pt will be able to verbalize understanding of pertinent lymphedema risk reduction practices relevant to her dx specifically related to skin care.  Baseline:  No knowledge Eval Achieved at eval  2 Pt will be able to return demo and/or verbalize understanding of the post op HEP related to regaining shoulder ROM. Baseline:  No knowledge Eval  Achieved at eval  3 Pt will be able to verbalize understanding of the importance of viewing the post op After Breast CA Class video for further lymphedema risk reduction education and therapeutic exercise.  Baseline:  No knowledge Eval Achieved at eval   LONG TERM GOALS:  (STG=LTG)  GOALS Name Target Date  Goal status  1 Pt will demonstrate she has regained full shoulder ROM and function post operatively compared to baselines.  Baseline: 11/02/23 MET                    PLAN:  PT FREQUENCY/DURATION: SOZO only  PLAN FOR NEXT SESSION: SOZO only    [facility]  [address], [address]  [place] [address]  [phone]  After Breast Cancer Class Video It is recommended you view the ABC class video to be educated on lymphedema risk reduction. This video lasts for about 30 minutes. It can be viewed on our website here: [email or web address]  Scar massage You can begin gentle scar massage to you incision sites. Gently place one hand on the incision and move the skin (without sliding on the skin) in various directions. Do this for a few minutes and then you can gently massage either coconut oil or vitamin E cream into the scars.  Compression garment You should continue wearing your compression bra until you feel like you no longer have swelling.  Home exercise Program Continue doing the exercises you were given until you feel like you can do them without feeling any tightness at  the end.   Walking Program Studies show that 30 minutes of walking per day (fast enough to elevate your heart rate) can significantly reduce the risk of a cancer recurrence. If you can't walk due to other medical reasons, we encourage you to find another activity you could do (like a stationary bike or water exercise).  Posture After breast cancer surgery, people frequently sit with rounded shoulders posture because it  puts their incisions on slack and feels better. If you sit like this and scar tissue forms in that position, you can become very tight and have pain sitting or standing with good posture. Try to be aware of your posture and sit and stand up tall to heal properly.  Follow up PT: It is recommended you return every 3 months for the first 3 years following surgery to be assessed on the SOZO machine for an L-Dex score. This helps prevent clinically significant lymphedema in 95% of patients. These follow up screens are 10 minute appointments that you are not billed for.  Larue Saddie SAUNDERS, PT 11/02/2023, 9:26 AM  PHYSICAL THERAPY DISCHARGE SUMMARY  Visits from Start of Care: 2  Current functional level related to goals / functional outcomes: See above   Remaining deficits: Lymphedema risk    Education / Equipment: Final self care plan  Plan: Patient agrees to discharge.  Patient is being discharged due to meeting the stated rehab goals.

## 2023-11-01 NOTE — Progress Notes (Signed)
   Established Patient Office Visit  Subjective   Patient ID: Tonya Andrews, female    DOB: 1975/04/07  Age: 48 y.o. MRN: [id number]  No chief complaint on file.   HPI  Patient is a 48 yo F with history of breast cancer who underwent bilateral mastectomies followed by subpectoral tissue expander reconstruction on 10/01/2023.Patient is better pain, yet still present on axillary dissection side. Incisions C/D/I. Still undecided on DIEPs versus implant based reconstruction. Will start chemotherapy in a few weeks.     Objective:     LMP 09/17/2023 (Approximate)  BP Readings from Last 3 Encounters:  10/25/23 112/80  10/19/23 113/78  10/12/23 112/83    Physical Exam MA as chaperone Incisions C/D/I. Small stitch below the skin noticed, skin intact.  We placed injectable saline in the Expander using a sterile technique: Right: 100 cc for a total of 250 cc Left: 100 cc for a total of 250 cc  Last CBC Lab Results  Component Value Date   WBC 6.0 06/11/2023   HGB 14.0 06/11/2023   HCT 41.7 06/11/2023   MCV 94.8 06/11/2023   MCH 31.8 06/11/2023   RDW 12.4 06/11/2023   PLT 300 06/11/2023   Last metabolic panel Lab Results  Component Value Date   GLUCOSE 84 06/11/2023   NA 136 06/11/2023   K 4.0 06/11/2023   CL 103 06/11/2023   CO2 28 06/11/2023   BUN 10 06/11/2023   CREATININE 0.61 06/11/2023   GFRNONAA >60 06/11/2023   CALCIUM 9.2 06/11/2023   PHOS 3.0 04/16/2009   PROT 7.3 08/11/2022   ALBUMIN  3.6 08/11/2022   BILITOT 0.3 08/11/2022   ALKPHOS 60 08/11/2022   AST 34 08/11/2022   ALT 41 08/11/2022   ANIONGAP 5 06/11/2023   The ASCVD Risk score (Arnett DK, et al., 2019) failed to calculate for the following reasons:   Risk score cannot be calculated because patient has a medical history suggesting prior/existing ASCVD   Assessment & Plan:   Problem List Items Addressed This Visit   None Visit Diagnoses       Follow-up exam    -  Primary      Postoperative  course as expected. Tolerated today's expansion well.  Follow up in 1 week for further expansion.   Amado Andal M. Kieron Kantner, MD [facility]

## 2023-11-02 ENCOUNTER — Encounter: Payer: Self-pay | Admitting: Rehabilitation

## 2023-11-02 ENCOUNTER — Ambulatory Visit: Attending: Surgery | Admitting: Rehabilitation

## 2023-11-02 DIAGNOSIS — Z483 Aftercare following surgery for neoplasm: Secondary | ICD-10-CM | POA: Diagnosis not present

## 2023-11-02 DIAGNOSIS — R293 Abnormal posture: Secondary | ICD-10-CM | POA: Insufficient documentation

## 2023-11-02 DIAGNOSIS — C50912 Malignant neoplasm of unspecified site of left female breast: Secondary | ICD-10-CM | POA: Diagnosis not present

## 2023-11-02 DIAGNOSIS — Z9189 Other specified personal risk factors, not elsewhere classified: Secondary | ICD-10-CM | POA: Insufficient documentation

## 2023-11-03 ENCOUNTER — Other Ambulatory Visit: Payer: Self-pay

## 2023-11-03 ENCOUNTER — Other Ambulatory Visit: Payer: Self-pay | Admitting: Pharmacist

## 2023-11-03 ENCOUNTER — Inpatient Hospital Stay: Attending: Hematology and Oncology

## 2023-11-03 ENCOUNTER — Inpatient Hospital Stay: Admitting: Pharmacist

## 2023-11-03 DIAGNOSIS — Z9013 Acquired absence of bilateral breasts and nipples: Secondary | ICD-10-CM | POA: Insufficient documentation

## 2023-11-03 DIAGNOSIS — Z79899 Other long term (current) drug therapy: Secondary | ICD-10-CM | POA: Insufficient documentation

## 2023-11-03 DIAGNOSIS — C50212 Malignant neoplasm of upper-inner quadrant of left female breast: Secondary | ICD-10-CM

## 2023-11-03 DIAGNOSIS — D6481 Anemia due to antineoplastic chemotherapy: Secondary | ICD-10-CM | POA: Insufficient documentation

## 2023-11-03 DIAGNOSIS — Z5189 Encounter for other specified aftercare: Secondary | ICD-10-CM | POA: Insufficient documentation

## 2023-11-03 DIAGNOSIS — Z171 Estrogen receptor negative status [ER-]: Secondary | ICD-10-CM | POA: Diagnosis not present

## 2023-11-03 DIAGNOSIS — Z5111 Encounter for antineoplastic chemotherapy: Secondary | ICD-10-CM | POA: Diagnosis not present

## 2023-11-03 DIAGNOSIS — Z91040 Latex allergy status: Secondary | ICD-10-CM | POA: Diagnosis not present

## 2023-11-03 DIAGNOSIS — Z1722 Progesterone receptor negative status: Secondary | ICD-10-CM | POA: Insufficient documentation

## 2023-11-03 DIAGNOSIS — T451X5A Adverse effect of antineoplastic and immunosuppressive drugs, initial encounter: Secondary | ICD-10-CM | POA: Diagnosis not present

## 2023-11-03 DIAGNOSIS — Z1732 Human epidermal growth factor receptor 2 negative status: Secondary | ICD-10-CM | POA: Diagnosis not present

## 2023-11-03 NOTE — Progress Notes (Signed)
 [facility]       Telephone: [phone]?Fax: [phone]   Oncology Clinical Pharmacist Practitioner Initial Assessment  Tonya Andrews is a 48 y.o. female with a diagnosis of breast cancer. They were contacted today via in-person visit. She is accompanied by her husband.  Indication/Regimen Docetaxel (Taxotere) and cyclophosphamide (Cytoxan) are being used appropriately for treatment of breast cancer by Dr. Vinay Gudena.      Wt Readings from Last 1 Encounters:  10/12/23 174 lb 6.4 oz (79.1 kg)    Estimated body surface area is 1.9 meters squared as calculated from the following:   Height as of 10/12/23: 5' 5 (1.651 m).   Weight as of 10/12/23: 174 lb 6.4 oz (79.1 kg).  The dosing regimen cycle is every 21 days x 4 cycles  Docetaxel (75 mg/m2) on Day 1 Cyclophosphamide (600 mg/m2) on Day 1 Pegfilgrastim (6 mg) on Day 3  It is planned to continue until treatment plan completion or unacceptable toxicity. The tentative start date is: 11/09/23  Dose Modifications none   Allergies Allergies  Allergen Reactions   Amoxicillin Nausea And Vomiting    Does not ever want again. Made her feel bad.     Nifedipine Other (See Comments)   Rho D Immune Globulin Other (See Comments)   Latex Other (See Comments)    Unknown to patient, happened during surgery    Vitals    11/01/2023    8:32 AM 10/25/2023   10:55 AM 10/19/2023    3:57 PM  Oncology Vitals  Pulse Rate 90 85 100  BP 108/75 112/80 113/78  SpO2 97 % 98 % 98 %     Laboratory Data    Latest Ref Rng & Units 06/11/2023    9:31 AM 08/11/2022   12:29 AM 05/25/2019    3:18 PM  CBC EXTENDED  WBC 4.0 - 10.5 K/uL 6.0  8.8  7.2   RBC 3.87 - 5.11 MIL/uL 4.40  4.45  4.47   Hemoglobin 12.0 - 15.0 g/dL 85.9  86.4  86.0   HCT 36.0 - 46.0 % 41.7  42.0  42.1   Platelets 150 - 400 K/uL 300  312  291   NEUT# 1.7 - 7.7 K/uL  4.5    Lymph# 0.7 - 4.0 K/uL  3.3         Latest Ref Rng & Units 06/11/2023    9:31 AM  01/22/2023   11:18 AM 08/11/2022   12:29 AM  CMP  Glucose 70 - 99 mg/dL 84  86  893   BUN 6 - 20 mg/dL 10  7  10    Creatinine 0.44 - 1.00 mg/dL 9.38  9.28  9.21   Sodium 135 - 145 mmol/L 136  139  137   Potassium 3.5 - 5.1 mmol/L 4.0  3.9  3.9   Chloride 98 - 111 mmol/L 103  103  105   CO2 22 - 32 mmol/L 28  20  25    Calcium 8.9 - 10.3 mg/dL 9.2  9.3  9.1   Total Protein 6.5 - 8.1 g/dL   7.3   Total Bilirubin 0.3 - 1.2 mg/dL   0.3   Alkaline Phos 38 - 126 U/L   60   AST 15 - 41 U/L   34   ALT 0 - 44 U/L   41     Contraindications Contraindications were reviewed? Yes Contraindications to therapy were identified? No   Safety Precautions The following safety precautions  were reviewed:  Fever: reviewed the importance of having a thermometer and the Centers for Disease Control and Prevention (CDC) definition of fever which is 100.36F (38C) or higher. Patient should call 24/7 triage at [phone] if experiencing a fever or any other symptoms Decreased white blood cells (WBCs) and increased risk for infection Decreased platelet count and increased risk of bleeding Decreased hemoglobin, part of the red blood cells that carry iron and oxygen Hair Loss Fatigue Fluid retention or swelling (edema) Peripheral Neuropathy Mouth sores Rash or itchy skin Muscle or joint pain or weakness Nausea or vomiting Diarrhea Nail Changes Hypersensitivity reactions Secondary Malignancies Hemorrhagic cystitis Pneumonitis Handling body fluids and waste Intimacy, sexual activity, contraception, fertility  Medication Reconciliation Current Outpatient Medications  Medication Sig Dispense Refill   acetaminophen  (TYLENOL ) 500 MG tablet Take 1 tablet (500 mg total) by mouth every 6 (six) hours. 30 tablet 0   albuterol  (VENTOLIN  HFA) 108 (90 Base) MCG/ACT inhaler Inhale 1-2 puffs into the lungs every 6 (six) hours as needed for wheezing or shortness of breath. 8.5 g 0   cetirizine  (ZYRTEC ) 10 MG  tablet Take 10 mg by mouth daily.     Cyanocobalamin  (VITAMIN B-12) 5000 MCG TBDP Take 5,000 mcg by mouth daily.     DULoxetine  (CYMBALTA ) 30 MG capsule Take 30-60 mg by mouth See admin instructions. Take 30 mg in the morning and 60 mg at bedtime     EMGALITY 120 MG/ML SOAJ Inject 120 mg into the skin every 30 (thirty) days.     Ferrous Sulfate (IRON PO) Take 1 tablet by mouth daily. Gummy     fluticasone  (FLONASE ) 50 MCG/ACT nasal spray Place 2 sprays into both nostrils daily for 7 days. 16 g 2   methocarbamol  (ROBAXIN ) 750 MG tablet Take 750 mg by mouth.     Multiple Vitamins-Minerals (MULTIVITAMIN WITH MINERALS) tablet Take 1 tablet by mouth daily. Gummy     omeprazole (PRILOSEC) 20 MG capsule Take 20 mg by mouth 2 (two) times daily before a meal.     Probiotic Product (PROBIOTIC PO) Take 1 tablet by mouth daily. 30 billion     dexamethasone  (DECADRON ) 4 MG tablet Take 1 tablet day before chemo and 1 tablet day after chemo with food (Patient not taking: Reported on 11/03/2023) 8 tablet 0   gabapentin  (NEURONTIN ) 300 MG capsule Take 1 capsule (300 mg total) by mouth 2 (two) times daily. 14 capsule 0   ibuprofen  (ADVIL ) 600 MG tablet Take 1 tablet (600 mg total) by mouth every 8 (eight) hours as needed. 30 tablet 0   lidocaine -prilocaine  (EMLA ) cream Apply to affected area once (Patient not taking: Reported on 11/03/2023) 30 g 3   ondansetron  (ZOFRAN ) 8 MG tablet Take 1 tablet (8 mg total) by mouth every 8 (eight) hours as needed for nausea or vomiting. Start on the third day after chemotherapy. (Patient not taking: Reported on 11/03/2023) 30 tablet 1   prochlorperazine  (COMPAZINE ) 10 MG tablet Take 1 tablet (10 mg total) by mouth every 6 (six) hours as needed for nausea or vomiting. (Patient not taking: Reported on 11/03/2023) 30 tablet 1   No current facility-administered medications for this visit.    Medication reconciliation is based on the patient's most recent medication list in the  electronic medical record (EMR) including herbal products and OTC medications.   The patient's medication list was reviewed today with the patient? Yes   Drug-drug interactions (DDIs) DDIs were evaluated? Yes Significant DDIs identified? Rho D  Immune Globulin allergy  alert for polysorbate 80. Ms. Skilling and husband were not aware of a specific reaction. Discussed with Dr. Gudena and will monitor closely during infusion. Nursing communication ordered added to day 1 of each cycle per Dr. Gudena..  Drug-Food Interactions Drug-food interactions were evaluated? Yes Drug-food interactions identified? Avoid grapefruit products  Follow-up Plan  Treatment start date: 11/09/23 Port placement date: 10/01/23 We reviewed the prescriptions, premedications, and treatment regimen with the patient. Possible side effects of the treatment regimen were reviewed and management strategies were discussed.  Can use loperamide as needed for diarrhea, loratadine as needed for G-CSF bone pain, and Senna-S as needed for constipation.  She is doing DigniCap and has all the material to bring to appointments. Dr. Gudena has added extra time for DigniCap Reviewed allergies with Rho D Immune Globulin with patient and Dr. Gudena. Ms. Lodwick and husband not aware of specific reaction. Dr. Gudena okay with close observation. Nursing communication added to day 1 of each cycle per Dr. Gudena. Clinical pharmacy will assist Dr. Vinay Gudena and Omya R Cypert on an as needed basis going forward  [employer] participated in the discussion, expressed understanding, and voiced agreement with the above plan. All questions were answered to her satisfaction. The patient was advised to contact the clinic at [phone] with any questions or concerns prior to her return visit.   I spent 60 minutes assessing the patient.  Casondra Gasca A. Lucila, PharmD, BCOP, CPP  Norleen DELENA Lucila, RPH-CPP, 11/03/2023 11:59 AM  **Disclaimer: This note was  dictated with voice recognition software. Similar sounding words can inadvertently be transcribed and this note may contain transcription errors which may not have been corrected upon publication of note.**

## 2023-11-08 ENCOUNTER — Inpatient Hospital Stay

## 2023-11-08 ENCOUNTER — Ambulatory Visit

## 2023-11-08 ENCOUNTER — Inpatient Hospital Stay (HOSPITAL_BASED_OUTPATIENT_CLINIC_OR_DEPARTMENT_OTHER): Admitting: Hematology and Oncology

## 2023-11-08 VITALS — BP 110/72 | HR 94 | Temp 97.4°F | Resp 18 | Ht 65.0 in | Wt 173.3 lb

## 2023-11-08 VITALS — BP 104/72 | HR 103 | Ht 65.0 in | Wt 173.0 lb

## 2023-11-08 DIAGNOSIS — Z91040 Latex allergy status: Secondary | ICD-10-CM | POA: Diagnosis not present

## 2023-11-08 DIAGNOSIS — Z5111 Encounter for antineoplastic chemotherapy: Secondary | ICD-10-CM | POA: Diagnosis not present

## 2023-11-08 DIAGNOSIS — C50212 Malignant neoplasm of upper-inner quadrant of left female breast: Secondary | ICD-10-CM

## 2023-11-08 DIAGNOSIS — Z9889 Other specified postprocedural states: Secondary | ICD-10-CM

## 2023-11-08 DIAGNOSIS — Z171 Estrogen receptor negative status [ER-]: Secondary | ICD-10-CM | POA: Diagnosis not present

## 2023-11-08 DIAGNOSIS — T451X5A Adverse effect of antineoplastic and immunosuppressive drugs, initial encounter: Secondary | ICD-10-CM | POA: Diagnosis not present

## 2023-11-08 DIAGNOSIS — Z9013 Acquired absence of bilateral breasts and nipples: Secondary | ICD-10-CM | POA: Diagnosis not present

## 2023-11-08 DIAGNOSIS — Z1722 Progesterone receptor negative status: Secondary | ICD-10-CM | POA: Diagnosis not present

## 2023-11-08 DIAGNOSIS — Z5189 Encounter for other specified aftercare: Secondary | ICD-10-CM | POA: Diagnosis not present

## 2023-11-08 DIAGNOSIS — Z79899 Other long term (current) drug therapy: Secondary | ICD-10-CM | POA: Diagnosis not present

## 2023-11-08 DIAGNOSIS — Z1732 Human epidermal growth factor receptor 2 negative status: Secondary | ICD-10-CM | POA: Diagnosis not present

## 2023-11-08 DIAGNOSIS — D6481 Anemia due to antineoplastic chemotherapy: Secondary | ICD-10-CM | POA: Diagnosis not present

## 2023-11-08 DIAGNOSIS — Z09 Encounter for follow-up examination after completed treatment for conditions other than malignant neoplasm: Secondary | ICD-10-CM

## 2023-11-08 LAB — CBC WITH DIFFERENTIAL (CANCER CENTER ONLY)
Abs Immature Granulocytes: 0.01 K/uL (ref 0.00–0.07)
Basophils Absolute: 0.1 K/uL (ref 0.0–0.1)
Basophils Relative: 1 %
Eosinophils Absolute: 0.2 K/uL (ref 0.0–0.5)
Eosinophils Relative: 4 %
HCT: 36.6 % (ref 36.0–46.0)
Hemoglobin: 12.3 g/dL (ref 12.0–15.0)
Immature Granulocytes: 0 %
Lymphocytes Relative: 25 %
Lymphs Abs: 1.5 K/uL (ref 0.7–4.0)
MCH: 30.7 pg (ref 26.0–34.0)
MCHC: 33.6 g/dL (ref 30.0–36.0)
MCV: 91.3 fL (ref 80.0–100.0)
Monocytes Absolute: 0.4 K/uL (ref 0.1–1.0)
Monocytes Relative: 7 %
Neutro Abs: 3.7 K/uL (ref 1.7–7.7)
Neutrophils Relative %: 63 %
Platelet Count: 296 K/uL (ref 150–400)
RBC: 4.01 MIL/uL (ref 3.87–5.11)
RDW: 13 % (ref 11.5–15.5)
WBC Count: 5.9 K/uL (ref 4.0–10.5)
nRBC: 0 % (ref 0.0–0.2)

## 2023-11-08 LAB — CMP (CANCER CENTER ONLY)
ALT: 11 U/L (ref 0–44)
AST: 14 U/L — ABNORMAL LOW (ref 15–41)
Albumin: 3.7 g/dL (ref 3.5–5.0)
Alkaline Phosphatase: 47 U/L (ref 38–126)
Anion gap: 5 (ref 5–15)
BUN: 10 mg/dL (ref 6–20)
CO2: 28 mmol/L (ref 22–32)
Calcium: 9 mg/dL (ref 8.9–10.3)
Chloride: 104 mmol/L (ref 98–111)
Creatinine: 0.8 mg/dL (ref 0.44–1.00)
GFR, Estimated: >60 mL/min (ref >60)
Glucose, Bld: 87 mg/dL (ref 70–99)
Potassium: 4.2 mmol/L (ref 3.5–5.1)
Sodium: 137 mmol/L (ref 135–145)
Total Bilirubin: 0.3 mg/dL (ref 0.0–1.2)
Total Protein: 6.4 g/dL — ABNORMAL LOW (ref 6.5–8.1)

## 2023-11-08 LAB — PREGNANCY, URINE: Preg Test, Ur: NEGATIVE

## 2023-11-08 NOTE — Progress Notes (Signed)
 Patient Care Team: Verena Mems, MD as PCP - General (Family Medicine) Lonni Slain, MD as PCP - Cardiology (Cardiology) Tyree Nanetta SAILOR, RN as Oncology Nurse Navigator Gerome, Devere HERO, RN as Registered Nurse Odean Potts, MD as Consulting Physician (Hematology and Oncology) Vernetta Berg, MD as Consulting Physician (General Surgery)  DIAGNOSIS:  Encounter Diagnosis  Name Primary?   Malignant neoplasm of upper-inner quadrant of left breast in female, estrogen receptor negative (HCC) Yes    SUMMARY OF ONCOLOGIC HISTORY: Oncology History  Malignant neoplasm of upper-inner quadrant of left breast in female, estrogen receptor negative (HCC)  08/19/2023 Initial Diagnosis   Mammogram and ultrasound detected left breast mass 0.6 cm, biopsy: Grade 2 IDC ER 0%, PR 0%, Ki67 40%, HER2 1+ negative    10/01/2023 Surgery   Bilateral mastectomies Left mastectomy: Grade 3 IDC 0.7 cm, margins negative, 0/3 lymph nodes negative, ER 0%, PR 0%, HER2 -1+, Ki-67 40% Right mastectomy: Benign   10/11/2023 Cancer Staging   Staging form: Breast, AJCC 8th Edition - Clinical stage from 10/11/2023: Stage IB (cT1b, cN0, cM0, G3, ER-, PR-, HER2-) - Signed by Odean Potts, MD on 10/11/2023 Histologic grading system: 3 grade system   11/09/2023 -  Chemotherapy   Patient is on Treatment Plan : BREAST TC q21d       CHIEF COMPLIANT: Cycle 1 Taxotere and Cytoxan to start 11/09/2023  HISTORY OF PRESENT ILLNESS: Tonya Andrews is a 48 year old with above-mentioned history of bilateral mastectomies for left breast triple negative breast cancer who is here to discuss starting chemotherapy tomorrow on 11/09/2023.  She had gone through chemo education and is doing her chemo with Dignicap.  She did not have any specific questions or concerns regarding chemo plan.    ALLERGIES:  is allergic to amoxicillin, nifedipine, rho d immune globulin, and latex.  MEDICATIONS:  Current Outpatient Medications  Medication  Sig Dispense Refill   acetaminophen  (TYLENOL ) 500 MG tablet Take 1 tablet (500 mg total) by mouth every 6 (six) hours. 30 tablet 0   albuterol  (VENTOLIN  HFA) 108 (90 Base) MCG/ACT inhaler Inhale 1-2 puffs into the lungs every 6 (six) hours as needed for wheezing or shortness of breath. 8.5 g 0   cetirizine  (ZYRTEC ) 10 MG tablet Take 10 mg by mouth daily.     Cyanocobalamin  (VITAMIN B-12) 5000 MCG TBDP Take 5,000 mcg by mouth daily.     dexamethasone  (DECADRON ) 4 MG tablet Take 1 tablet day before chemo and 1 tablet day after chemo with food 8 tablet 0   DULoxetine  (CYMBALTA ) 30 MG capsule Take 30-60 mg by mouth See admin instructions. Take 30 mg in the morning and 60 mg at bedtime     EMGALITY 120 MG/ML SOAJ Inject 120 mg into the skin every 30 (thirty) days.     Ferrous Sulfate (IRON PO) Take 1 tablet by mouth daily. Gummy     fluticasone  (FLONASE ) 50 MCG/ACT nasal spray Place 2 sprays into both nostrils daily for 7 days. 16 g 2   gabapentin  (NEURONTIN ) 300 MG capsule Take 1 capsule (300 mg total) by mouth 2 (two) times daily. 14 capsule 0   ibuprofen  (ADVIL ) 600 MG tablet Take 1 tablet (600 mg total) by mouth every 8 (eight) hours as needed. 30 tablet 0   lidocaine -prilocaine  (EMLA ) cream Apply to affected area once 30 g 3   methocarbamol  (ROBAXIN ) 750 MG tablet Take 750 mg by mouth.     Multiple Vitamins-Minerals (MULTIVITAMIN WITH MINERALS) tablet Take 1 tablet by mouth  daily. Gummy     omeprazole (PRILOSEC) 20 MG capsule Take 20 mg by mouth 2 (two) times daily before a meal.     ondansetron  (ZOFRAN ) 8 MG tablet Take 1 tablet (8 mg total) by mouth every 8 (eight) hours as needed for nausea or vomiting. Start on the third day after chemotherapy. 30 tablet 1   Probiotic Product (PROBIOTIC PO) Take 1 tablet by mouth daily. 30 billion     prochlorperazine  (COMPAZINE ) 10 MG tablet Take 1 tablet (10 mg total) by mouth every 6 (six) hours as needed for nausea or vomiting. 30 tablet 1   No current  facility-administered medications for this visit.    PHYSICAL EXAMINATION: ECOG PERFORMANCE STATUS: 1 - Symptomatic but completely ambulatory  Vitals:   11/08/23 0816  BP: 110/72  Pulse: 94  Resp: 18  Temp: (!) 97.4 F (36.3 C)  SpO2: 100%   Filed Weights   11/08/23 0816  Weight: 173 lb 4.8 oz (78.6 kg)      LABORATORY DATA:  I have reviewed the data as listed    Latest Ref Rng & Units 06/11/2023    9:31 AM 01/22/2023   11:18 AM 08/11/2022   12:29 AM  CMP  Glucose 70 - 99 mg/dL 84  86  893   BUN 6 - 20 mg/dL 10  7  10    Creatinine 0.44 - 1.00 mg/dL 9.38  9.28  9.21   Sodium 135 - 145 mmol/L 136  139  137   Potassium 3.5 - 5.1 mmol/L 4.0  3.9  3.9   Chloride 98 - 111 mmol/L 103  103  105   CO2 22 - 32 mmol/L 28  20  25    Calcium 8.9 - 10.3 mg/dL 9.2  9.3  9.1   Total Protein 6.5 - 8.1 g/dL   7.3   Total Bilirubin 0.3 - 1.2 mg/dL   0.3   Alkaline Phos 38 - 126 U/L   60   AST 15 - 41 U/L   34   ALT 0 - 44 U/L   41     Lab Results  Component Value Date   WBC 5.9 11/08/2023   HGB 12.3 11/08/2023   HCT 36.6 11/08/2023   MCV 91.3 11/08/2023   PLT 296 11/08/2023   NEUTROABS 3.7 11/08/2023    ASSESSMENT & PLAN:  Malignant neoplasm of upper-inner quadrant of left breast in female, estrogen receptor negative (HCC) 10/01/2023:Bilateral mastectomies Left mastectomy: Grade 3 IDC 0.7 cm, margins negative, 0/3 lymph nodes negative, ER 0%, PR 0%, HER2 -1+, Ki-67 40% Right mastectomy: Benign   Treatment plan: Adjuvant chemotherapy with Taxotere and Cytoxan every 3 weeks x 4 (starting 10/8 ) Adjuvant radiation therapy -------------------------------------------------------------------------------------------------------------------------------------- Current treatment: Cycle 1 day 1 Taxotere and Cytoxan (Dignicap) to start 11/09/2023 Labs reviewed, chemo education completed, chemo consent obtained, antiemetics were reviewed Return to clinic in 1 week for toxicity check      No orders of the defined types were placed in this encounter.  The patient has a good understanding of the overall plan. she agrees with it. she will call with any problems that may develop before the next visit here. Total time spent: 30 mins including face to face time and time spent for planning, charting and co-ordination of care   Tonya MARLA Chad, MD 11/08/23

## 2023-11-08 NOTE — Progress Notes (Signed)
 Established Patient Office Visit  Subjective   Patient ID: Tonya Andrews, female    DOB: 1975/05/27  Age: 48 y.o. MRN: [id number]  Chief Complaint  Patient presents with   Post-op Follow-up    HPI Patient is a 48 yo F with history of breast cancer who underwent bilateral mastectomies followed by subpectoral tissue expander reconstruction on 10/01/2023.Patient is better pain, yet still present on axillary dissection side. Incisions C/D/I. Still undecided on DIEPs versus implant based reconstruction. Will start chemotherapy this week.    Objective:     BP 104/72 (BP Location: Right Arm, Patient Position: Sitting, Cuff Size: Normal)   Pulse (!) 103   Ht 5' 5 (1.651 m)   Wt 173 lb (78.5 kg)   SpO2 97%   BMI 28.79 kg/m  BP Readings from Last 3 Encounters:  11/08/23 104/72  11/08/23 110/72  11/01/23 108/75   Physical Exam MA as chaperone Incisions C/D/I. Small stitch below the skin noticed, skin intact.  We placed injectable saline in the Expander using a sterile technique: Right: 100 cc for a total of 350 cc Left: 100 cc for a total of 350 cc  Results for orders placed or performed in visit on 11/08/23  Pregnancy, urine  Result Value Ref Range   Preg Test, Ur NEGATIVE NEGATIVE  CBC with Differential (Cancer Center Only)  Result Value Ref Range   WBC Count 5.9 4.0 - 10.5 K/uL   RBC 4.01 3.87 - 5.11 MIL/uL   Hemoglobin 12.3 12.0 - 15.0 g/dL   HCT 63.3 63.9 - 53.9 %   MCV 91.3 80.0 - 100.0 fL   MCH 30.7 26.0 - 34.0 pg   MCHC 33.6 30.0 - 36.0 g/dL   RDW 86.9 88.4 - 84.4 %   Platelet Count 296 150 - 400 K/uL   nRBC 0.0 0.0 - 0.2 %   Neutrophils Relative % 63 %   Neutro Abs 3.7 1.7 - 7.7 K/uL   Lymphocytes Relative 25 %   Lymphs Abs 1.5 0.7 - 4.0 K/uL   Monocytes Relative 7 %   Monocytes Absolute 0.4 0.1 - 1.0 K/uL   Eosinophils Relative 4 %   Eosinophils Absolute 0.2 0.0 - 0.5 K/uL   Basophils Relative 1 %   Basophils Absolute 0.1 0.0 - 0.1 K/uL   Immature  Granulocytes 0 %   Abs Immature Granulocytes 0.01 0.00 - 0.07 K/uL  CMP (Cancer Center only)  Result Value Ref Range   Sodium 137 135 - 145 mmol/L   Potassium 4.2 3.5 - 5.1 mmol/L   Chloride 104 98 - 111 mmol/L   CO2 28 22 - 32 mmol/L   Glucose, Bld 87 70 - 99 mg/dL   BUN 10 6 - 20 mg/dL   Creatinine 9.19 9.55 - 1.00 mg/dL   Calcium 9.0 8.9 - 89.6 mg/dL   Total Protein 6.4 (L) 6.5 - 8.1 g/dL   Albumin  3.7 3.5 - 5.0 g/dL   AST 14 (L) 15 - 41 U/L   ALT 11 0 - 44 U/L   Alkaline Phosphatase 47 38 - 126 U/L   Total Bilirubin 0.3 0.0 - 1.2 mg/dL   GFR, Estimated >39 >39 mL/min   Anion gap 5 5 - 15    Last CBC Lab Results  Component Value Date   WBC 5.9 11/08/2023   HGB 12.3 11/08/2023   HCT 36.6 11/08/2023   MCV 91.3 11/08/2023   MCH 30.7 11/08/2023   RDW 13.0 11/08/2023   PLT  296 11/08/2023   Last metabolic panel Lab Results  Component Value Date   GLUCOSE 87 11/08/2023   NA 137 11/08/2023   K 4.2 11/08/2023   CL 104 11/08/2023   CO2 28 11/08/2023   BUN 10 11/08/2023   CREATININE 0.80 11/08/2023   GFRNONAA >60 11/08/2023   CALCIUM 9.0 11/08/2023   PHOS 3.0 04/16/2009   PROT 6.4 (L) 11/08/2023   ALBUMIN  3.7 11/08/2023   BILITOT 0.3 11/08/2023   ALKPHOS 47 11/08/2023   AST 14 (L) 11/08/2023   ALT 11 11/08/2023   ANIONGAP 5 11/08/2023      The ASCVD Risk score (Arnett DK, et al., 2019) failed to calculate for the following reasons:   Risk score cannot be calculated because patient has a medical history suggesting prior/existing ASCVD    Assessment & Plan:   Problem List Items Addressed This Visit   None Visit Diagnoses       Follow-up exam    -  Primary       Postoperative course as expected. Tolerated today's expansion well.  Follow up in a few weeks for checkup.  We will hold off on expansions until end of chemotherapy.   Tonya Andrews M Delvon Chipps, MD

## 2023-11-08 NOTE — Assessment & Plan Note (Signed)
 10/01/2023:Bilateral mastectomies Left mastectomy: Grade 3 IDC 0.7 cm, margins negative, 0/3 lymph nodes negative, ER 0%, PR 0%, HER2 -1+, Ki-67 40% Right mastectomy: Benign   Treatment plan: Adjuvant chemotherapy with Taxotere and Cytoxan every 3 weeks x 4 (to start 11/09/2023) Adjuvant radiation therapy -------------------------------------------------------------------------------------------------------------------------------------- Current treatment: Cycle 1 day 1 Taxotere and Cytoxan Labs reviewed, chemo education completed, chemo consent obtained, antiemetics were reviewed Return to clinic in 1 week for toxicity check

## 2023-11-09 ENCOUNTER — Encounter: Payer: Self-pay | Admitting: Hematology and Oncology

## 2023-11-09 ENCOUNTER — Inpatient Hospital Stay

## 2023-11-09 VITALS — BP 110/82 | HR 98 | Temp 97.7°F | Resp 16

## 2023-11-09 DIAGNOSIS — Z5111 Encounter for antineoplastic chemotherapy: Secondary | ICD-10-CM | POA: Diagnosis not present

## 2023-11-09 DIAGNOSIS — C50212 Malignant neoplasm of upper-inner quadrant of left female breast: Secondary | ICD-10-CM

## 2023-11-09 MED ORDER — SODIUM CHLORIDE 0.9 % IV SOLN
75.0000 mg/m2 | Freq: Once | INTRAVENOUS | Status: AC
  Administered 2023-11-09: 160 mg via INTRAVENOUS
  Filled 2023-11-09: qty 16

## 2023-11-09 MED ORDER — PALONOSETRON HCL INJECTION 0.25 MG/5ML
0.2500 mg | Freq: Once | INTRAVENOUS | Status: AC
  Administered 2023-11-09: 0.25 mg via INTRAVENOUS
  Filled 2023-11-09: qty 5

## 2023-11-09 MED ORDER — DEXAMETHASONE SODIUM PHOSPHATE 10 MG/ML IJ SOLN
10.0000 mg | Freq: Once | INTRAMUSCULAR | Status: AC
  Administered 2023-11-09: 10 mg via INTRAVENOUS
  Filled 2023-11-09: qty 1

## 2023-11-09 MED ORDER — SODIUM CHLORIDE 0.9 % IV SOLN: Freq: Once | INTRAVENOUS | Status: DC | PRN

## 2023-11-09 MED ORDER — METHYLPREDNISOLONE SODIUM SUCC 125 MG IJ SOLR
125.0000 mg | Freq: Once | INTRAMUSCULAR | Status: AC | PRN
  Administered 2023-11-09: 125 mg via INTRAVENOUS

## 2023-11-09 MED ORDER — SODIUM CHLORIDE 0.9 % IV SOLN
600.0000 mg/m2 | Freq: Once | INTRAVENOUS | Status: AC
  Administered 2023-11-09: 1160 mg via INTRAVENOUS
  Filled 2023-11-09: qty 58

## 2023-11-09 MED ORDER — FAMOTIDINE IN NACL 20-0.9 MG/50ML-% IV SOLN
20.0000 mg | Freq: Once | INTRAVENOUS | Status: AC | PRN
  Administered 2023-11-09: 20 mg via INTRAVENOUS

## 2023-11-09 MED ORDER — ACETAMINOPHEN 325 MG PO TABS
650.0000 mg | ORAL_TABLET | Freq: Once | ORAL | Status: AC
  Administered 2023-11-09: 650 mg via ORAL
  Filled 2023-11-09: qty 2

## 2023-11-09 MED ORDER — MORPHINE SULFATE (PF) 2 MG/ML IV SOLN
1.0000 mg | Freq: Once | INTRAVENOUS | Status: AC
  Administered 2023-11-09: 1 mg via INTRAVENOUS
  Filled 2023-11-09: qty 1

## 2023-11-09 MED ORDER — SODIUM CHLORIDE 0.9 % IV SOLN: INTRAVENOUS | Status: DC

## 2023-11-09 NOTE — Progress Notes (Signed)
 Pt here for first time taxotere/cytoxan. After the second bump up, pt c/o pain 5/10 in left shoulder radiating to back. Due to a possible reaction from taxotere related to previous rhogam shot reaction (during miscarriage), and out of an abundance of caution, we treated this as a reaction. Dr. Gudena and Johnston were notified. Pt received IVF, pepcid, morphine  and solumedrol. EKG normal. Pt pain did not subside. It was concluded that this pain was not coming from taxotere and treatment was restarted. Pt able to complete treatment without further incident. No changes were made to future treatments. Pt stated she thinks pain may be coming from recent mastectomy with expanders and/or lymph node removal. Pt discharged in stable condition.

## 2023-11-09 NOTE — Patient Instructions (Signed)
 [facility] - A DEPT OF [facility]  Discharge Instructions: Thank you for choosing [facility] to provide your oncology and hematology care.   If you have a lab appointment with the Cancer Center, please go directly to the Cancer Center and check in at the registration area.   Wear comfortable clothing and clothing appropriate for easy access to any Portacath or PICC line.   We strive to give you quality time with your provider. You may need to reschedule your appointment if you arrive late (15 or more minutes).  Arriving late affects you and other patients whose appointments are after yours.  Also, if you miss three or more appointments without notifying the office, you may be dismissed from the clinic at the provider's discretion.      For prescription refill requests, have your pharmacy contact our office and allow 72 hours for refills to be completed.    Today you received the following chemotherapy and/or immunotherapy agents docetaxel, cyclophosphamind      To help prevent nausea and vomiting after your treatment, we encourage you to take your nausea medication as directed.  BELOW ARE SYMPTOMS THAT SHOULD BE REPORTED IMMEDIATELY: *FEVER GREATER THAN 100.4 F (38 C) OR HIGHER *CHILLS OR SWEATING *NAUSEA AND VOMITING THAT IS NOT CONTROLLED WITH YOUR NAUSEA MEDICATION *UNUSUAL SHORTNESS OF BREATH *UNUSUAL BRUISING OR BLEEDING *URINARY PROBLEMS (pain or burning when urinating, or frequent urination) *BOWEL PROBLEMS (unusual diarrhea, constipation, pain near the anus) TENDERNESS IN MOUTH AND THROAT WITH OR WITHOUT PRESENCE OF ULCERS (sore throat, sores in mouth, or a toothache) UNUSUAL RASH, SWELLING OR PAIN  UNUSUAL VAGINAL DISCHARGE OR ITCHING   Items with * indicate a potential emergency and should be followed up as soon as possible or go to the Emergency Department if any problems should occur.  Please show the CHEMOTHERAPY ALERT CARD  or IMMUNOTHERAPY ALERT CARD at check-in to the Emergency Department and triage nurse.  Should you have questions after your visit or need to cancel or reschedule your appointment, please contact [facility] - A DEPT OF [facility]  Dept: [phone]  and follow the prompts.  Office hours are 8:00 a.m. to 4:30 p.m. Monday - Friday. Please note that voicemails left after 4:00 p.m. may not be returned until the following business day.  We are closed weekends and major holidays. You have access to a nurse at all times for urgent questions. Please call the main number to the clinic Dept: [phone] and follow the prompts.   For any non-urgent questions, you may also contact your provider using MyChart. We now offer e-Visits for anyone 17 and older to request care online for non-urgent symptoms. For details visit [email or web address].   Also download the MyChart app! Go to the app store, search MyChart, open the app, select [facility], and log in with your MyChart username and password.

## 2023-11-10 ENCOUNTER — Other Ambulatory Visit: Payer: Self-pay

## 2023-11-10 ENCOUNTER — Encounter: Payer: Self-pay | Admitting: Hematology and Oncology

## 2023-11-10 MED ORDER — GABAPENTIN 300 MG PO CAPS: 300.0000 mg | ORAL_CAPSULE | Freq: Two times a day (BID) | ORAL | 6 refills | Status: AC

## 2023-11-10 NOTE — Telephone Encounter (Signed)
 Called & left message for pt to return call tomorrow to let us  know how she is doing.

## 2023-11-10 NOTE — Telephone Encounter (Signed)
-----   Message from Nurse Kerri GAILS sent at 11/09/2023  3:43 PM EDT ----- Regarding: FT chemo Gudena Pt able to complete treatment but did have a significant delay due to shoulder pain during taxotere. We weren't sure if it was a reaction, so we treated it as such. It was concluded she did NOT react to taxotere and was able to complete all treatments. Pt used a dignicap as well.

## 2023-11-11 ENCOUNTER — Other Ambulatory Visit: Payer: Self-pay | Admitting: *Deleted

## 2023-11-11 ENCOUNTER — Inpatient Hospital Stay

## 2023-11-11 VITALS — BP 97/72 | HR 105 | Temp 97.9°F | Resp 16

## 2023-11-11 DIAGNOSIS — T451X5A Adverse effect of antineoplastic and immunosuppressive drugs, initial encounter: Secondary | ICD-10-CM | POA: Diagnosis not present

## 2023-11-11 DIAGNOSIS — C50212 Malignant neoplasm of upper-inner quadrant of left female breast: Secondary | ICD-10-CM | POA: Diagnosis not present

## 2023-11-11 DIAGNOSIS — Z91040 Latex allergy status: Secondary | ICD-10-CM | POA: Diagnosis not present

## 2023-11-11 DIAGNOSIS — D6481 Anemia due to antineoplastic chemotherapy: Secondary | ICD-10-CM | POA: Diagnosis not present

## 2023-11-11 DIAGNOSIS — Z5111 Encounter for antineoplastic chemotherapy: Secondary | ICD-10-CM | POA: Diagnosis not present

## 2023-11-11 DIAGNOSIS — Z9013 Acquired absence of bilateral breasts and nipples: Secondary | ICD-10-CM | POA: Diagnosis not present

## 2023-11-11 DIAGNOSIS — Z1722 Progesterone receptor negative status: Secondary | ICD-10-CM | POA: Diagnosis not present

## 2023-11-11 DIAGNOSIS — Z79899 Other long term (current) drug therapy: Secondary | ICD-10-CM | POA: Diagnosis not present

## 2023-11-11 DIAGNOSIS — Z171 Estrogen receptor negative status [ER-]: Secondary | ICD-10-CM

## 2023-11-11 DIAGNOSIS — R350 Frequency of micturition: Secondary | ICD-10-CM

## 2023-11-11 DIAGNOSIS — Z1732 Human epidermal growth factor receptor 2 negative status: Secondary | ICD-10-CM | POA: Diagnosis not present

## 2023-11-11 MED ORDER — PEGFILGRASTIM-JMDB 6 MG/0.6ML ~~LOC~~ SOSY
6.0000 mg | PREFILLED_SYRINGE | Freq: Once | SUBCUTANEOUS | Status: AC
  Administered 2023-11-11: 6 mg via SUBCUTANEOUS
  Filled 2023-11-11: qty 0.6

## 2023-11-11 NOTE — Progress Notes (Signed)
 Received call from pt with complaint of bladder pressure and urine frequency. Per MD pt needing to be seen by [facility] with [facility].  Appts scheduled, pt notified and verbalized understanding.

## 2023-11-12 ENCOUNTER — Inpatient Hospital Stay (HOSPITAL_BASED_OUTPATIENT_CLINIC_OR_DEPARTMENT_OTHER): Admitting: Physician Assistant

## 2023-11-12 ENCOUNTER — Encounter: Payer: Self-pay | Admitting: Hematology and Oncology

## 2023-11-12 ENCOUNTER — Inpatient Hospital Stay

## 2023-11-12 ENCOUNTER — Encounter: Payer: Self-pay | Admitting: Physician Assistant

## 2023-11-12 VITALS — BP 100/67 | HR 99 | Temp 97.9°F | Resp 20 | Wt 175.0 lb

## 2023-11-12 DIAGNOSIS — Z91040 Latex allergy status: Secondary | ICD-10-CM | POA: Diagnosis not present

## 2023-11-12 DIAGNOSIS — Z1732 Human epidermal growth factor receptor 2 negative status: Secondary | ICD-10-CM | POA: Diagnosis not present

## 2023-11-12 DIAGNOSIS — C50212 Malignant neoplasm of upper-inner quadrant of left female breast: Secondary | ICD-10-CM | POA: Diagnosis not present

## 2023-11-12 DIAGNOSIS — R350 Frequency of micturition: Secondary | ICD-10-CM | POA: Diagnosis not present

## 2023-11-12 DIAGNOSIS — Z1722 Progesterone receptor negative status: Secondary | ICD-10-CM | POA: Diagnosis not present

## 2023-11-12 DIAGNOSIS — K5900 Constipation, unspecified: Secondary | ICD-10-CM

## 2023-11-12 DIAGNOSIS — Z171 Estrogen receptor negative status [ER-]: Secondary | ICD-10-CM

## 2023-11-12 DIAGNOSIS — Z9013 Acquired absence of bilateral breasts and nipples: Secondary | ICD-10-CM | POA: Diagnosis not present

## 2023-11-12 DIAGNOSIS — Z5111 Encounter for antineoplastic chemotherapy: Secondary | ICD-10-CM | POA: Diagnosis not present

## 2023-11-12 DIAGNOSIS — Z5189 Encounter for other specified aftercare: Secondary | ICD-10-CM | POA: Diagnosis not present

## 2023-11-12 DIAGNOSIS — T451X5A Adverse effect of antineoplastic and immunosuppressive drugs, initial encounter: Secondary | ICD-10-CM | POA: Diagnosis not present

## 2023-11-12 DIAGNOSIS — Z79899 Other long term (current) drug therapy: Secondary | ICD-10-CM | POA: Diagnosis not present

## 2023-11-12 DIAGNOSIS — D6481 Anemia due to antineoplastic chemotherapy: Secondary | ICD-10-CM | POA: Diagnosis not present

## 2023-11-12 LAB — URINALYSIS, COMPLETE (UACMP) WITH MICROSCOPIC
Bilirubin Urine: NEGATIVE
Glucose, UA: NEGATIVE mg/dL
Hgb urine dipstick: NEGATIVE
Ketones, ur: NEGATIVE mg/dL
Leukocytes,Ua: NEGATIVE
Nitrite: NEGATIVE
Protein, ur: NEGATIVE mg/dL
Specific Gravity, Urine: 1.001 — ABNORMAL LOW (ref 1.005–1.030)
pH: 7 (ref 5.0–8.0)

## 2023-11-12 MED ORDER — PHENAZOPYRIDINE HCL 100 MG PO TABS
100.0000 mg | ORAL_TABLET | Freq: Three times a day (TID) | ORAL | 0 refills | Status: DC | PRN
Start: 2023-11-12 — End: 2023-12-21

## 2023-11-12 NOTE — Progress Notes (Signed)
 Symptom Management Consult Note [facility]    Patient Care Team: Verena Mems, MD as PCP - General (Family Medicine) Lonni Slain, MD as PCP - Cardiology (Cardiology) Tyree Nanetta SAILOR, RN as Oncology Nurse Navigator Gerome, Devere HERO, RN as Registered Nurse Odean Potts, MD as Consulting Physician (Hematology and Oncology) Vernetta Berg, MD as Consulting Physician (General Surgery)    Name / MRN / DOBAzoria Andrews  [id number]  [birth year]   Date of visit: 11/12/2023   Chief Complaint/Reason for visit: urinary frequency   Current Therapy: Taxotere and Cytoxan with fulphila  Last treatment:  Day 1   Cycle 1 on 11/09/23 with G-CSF on 11/11/23    ASSESSMENT AND PLAN Patient is a 48 y.o. female with oncologic history of malignant neoplasm of upper-inner quadrant of left breast in female, estrogen receptor negative followed by Dr. Odean.  I have viewed most recent oncology note and lab work.  #Malignant neoplasm of upper-inner quadrant of left breast in female, estrogen receptor negative  - Next appointment with oncologist is 11/30/23. - Discussed common symptoms of G-CSF injection and signs of infection to monitor for.  #Urinary frequency - Pt non toxic appearing. Mild suprapubic tenderness without guarding or peritoneal signs. No CVA tenderness. - UA is negative for infection. Will follow up on urine culture. - Prescription sent to pharmacy for pyridium for symptomatic treatment.  #Constipation - Started taking Miralax today. Discussed adding stool softener if no improvement with Miralax only.   Strict ED precautions discussed should symptoms worsen.   HEME/ONC HISTORY Oncology History  Malignant neoplasm of upper-inner quadrant of left breast in female, estrogen receptor negative (HCC)  08/19/2023 Initial Diagnosis   Mammogram and ultrasound detected left breast mass 0.6 cm, biopsy: Grade 2 IDC ER 0%, PR 0%, Ki67 40%, HER2 1+ negative     10/01/2023 Surgery   Bilateral mastectomies Left mastectomy: Grade 3 IDC 0.7 cm, margins negative, 0/3 lymph nodes negative, ER 0%, PR 0%, HER2 -1+, Ki-67 40% Right mastectomy: Benign   10/11/2023 Cancer Staging   Staging form: Breast, AJCC 8th Edition - Clinical stage from 10/11/2023: Stage IB (cT1b, cN0, cM0, G3, ER-, PR-, HER2-) - Signed by Odean Potts, MD on 10/11/2023 Histologic grading system: 3 grade system   11/09/2023 -  Chemotherapy   Patient is on Treatment Plan : BREAST TC q21d         INTERVAL HISTORY  Discussed the use of AI scribe software for clinical note transcription with the patient, who gave verbal consent to proceed.    Tonya Andrews is a 48 y.o. female with oncologic history as above presenting to [facility] today with chief complaint of urinary frequency. Patient presents unaccompanied to visit today.  She is experiencing urinary symptoms including a sensation of pressure and discomfort in the bladder area, increased frequency of urination x 2 days. There is no dysuria or gross hematuria.   Constipation is a concern, with the last bowel movement occurring on Wednesday, described as hard stool. She typically has daily bowel movements and started taking Miralax to alleviate the constipation today.   She reports generalized body aches and flu-like symptoms following her injection related to her chemotherapy treatment. The pain affects her long bones, including the thighs, arms, chest, and back. She has taken Advil  for pain. Mild nausea is present, but there is no vomiting. Denies fever or chills.       ROS  All other systems are reviewed and are negative  for acute change except as noted in the HPI.    Allergies  Allergen Reactions   Amoxicillin Nausea And Vomiting    Does not ever want again. Made her feel bad.     Nifedipine Other (See Comments)   Rho D Immune Globulin Other (See Comments)   Latex Other (See Comments)    Unknown to patient, happened during  surgery     Past Medical History:  Diagnosis Date   Arthritis    Celiac disease    Fibromyalgia    Gallstones    GERD (gastroesophageal reflux disease)    Migraine headache    Dr Oneita   Nasal polyp    Nerve pain    PFO (patent foramen ovale) 04/2009   by bubble study/TEE, Dr Wonda, cardiology   PONV (postoperative nausea and vomiting)    Seasonal allergies    Stroke (HCC) 04/2009, 07/2009   Dr Rosemarie right arm weakness     Past Surgical History:  Procedure Laterality Date   BREAST BIOPSY Left 08/19/2023   US  LT BREAST BX W LOC DEV 1ST LESION IMG BX SPEC US  GUIDE 08/19/2023 [facility] MAMMOGRAPHY   CHOLECYSTECTOMY N/A 05/30/2019   Procedure: LAPAROSCOPIC CHOLECYSTECTOMY;  Surgeon: Tanda Locus, MD;  Location: [facility];  Service: General;  Laterality: N/A;   HARDWARE REMOVAL Right 06/15/2023   Procedure: REMOVAL, HARDWARE;  Surgeon: Sheril Coy, MD;  Location: [facility];  Service: Orthopedics;  Laterality: Right;   KNEE SURGERY Right    x2   PATENT FORAMEN OVALE(PFO) CLOSURE  09/2009   transcath closure    PORTACATH PLACEMENT N/A 10/01/2023   Procedure: INSERTION, TUNNELED CENTRAL VENOUS DEVICE, WITH PORT;  Surgeon: Vernetta Berg, MD;  Location: [facility];  Service: General;  Laterality: N/A;  PORT-A-CATH INSERTION WITH ULTRASOUND GUIDANCE   SENTINEL NODE BIOPSY Left 10/01/2023   Procedure: BIOPSY, LYMPH NODE, SENTINEL;  Surgeon: Vernetta Berg, MD;  Location: [facility];  Service: General;  Laterality: Left;   TISSUE EXPANDER PLACEMENT Bilateral 10/01/2023   Procedure: INSERTION, TISSUE EXPANDER;  Surgeon: Montorfano, Lisandro M, MD;  Location: [facility];  Service: Plastics;  Laterality: Bilateral;  WITH ACELLULAR DERMAL MATRIX   TOTAL KNEE ARTHROPLASTY Right 06/15/2023   Procedure: ARTHROPLASTY, KNEE, TOTAL;  Surgeon: Sheril Coy, MD;  Location: [facility];  Service: Orthopedics;  Laterality: Right;   TOTAL MASTECTOMY Bilateral  10/01/2023   Procedure: MASTECTOMY, SIMPLE;  Surgeon: Vernetta Berg, MD;  Location: [facility];  Service: General;  Laterality: Bilateral;   WISDOM TOOTH EXTRACTION      Social History   Socioeconomic History   Marital status: Married    Spouse name: Not on file   Number of children: 3   Years of education: masters   Highest education level: Not on file  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  Tobacco Use   Smoking status: Never    Passive exposure: Never   Smokeless tobacco: Never  Vaping Use   Vaping status: Never Used  Substance and Sexual Activity   Alcohol use: Never   Drug use: No   Sexual activity: Not on file    Comment: Vasectomy  Other Topics Concern   Not on file  Social History Narrative   Lives home with family   Caffeine- coffee, 1 daily   Social Drivers of Health   Financial Resource Strain: Not on file  Food Insecurity: No Food Insecurity (09/01/2023)   Hunger Vital Sign  Worried About Programme researcher, broadcasting/film/video in the Last Year: Never true    [employer] of Food in the Last Year: Never true  Transportation Needs: No Transportation Needs (09/01/2023)   PRAPARE - Administrator, Civil Service (Medical): No    Lack of Transportation (Non-Medical): No  Physical Activity: Not on file  Stress: Not on file  Social Connections: Unknown (03/02/2022)   Received from [facility]   Social Network    Social Network: Not on file  Intimate Partner Violence: Not At Risk (09/01/2023)   Humiliation, Afraid, Rape, and Kick questionnaire    Fear of Current or Ex-Partner: No    Emotionally Abused: No    Physically Abused: No    Sexually Abused: No    Family History  Problem Relation Age of Onset   Hypertension Mother    Arthritis/Rheumatoid Father    Prostate cancer Father 73   Lung cancer Maternal Uncle 50       smoked   Prostate cancer Maternal Grandfather 78 - 45       metastatic   Eczema Daughter    Asthma Daughter    Eczema  Daughter    Asthma Daughter    Allergic rhinitis Daughter    Eczema Son    Asthma Son    Allergic rhinitis Son      Current Outpatient Medications:    phenazopyridine (PYRIDIUM) 100 MG tablet, Take 1 tablet (100 mg total) by mouth 3 (three) times daily as needed for up to 3 days for pain., Disp: 9 tablet, Rfl: 0   acetaminophen  (TYLENOL ) 500 MG tablet, Take 1 tablet (500 mg total) by mouth every 6 (six) hours., Disp: 30 tablet, Rfl: 0   albuterol  (VENTOLIN  HFA) 108 (90 Base) MCG/ACT inhaler, Inhale 1-2 puffs into the lungs every 6 (six) hours as needed for wheezing or shortness of breath., Disp: 8.5 g, Rfl: 0   cetirizine  (ZYRTEC ) 10 MG tablet, Take 10 mg by mouth daily., Disp: , Rfl:    Cyanocobalamin  (VITAMIN B-12) 5000 MCG TBDP, Take 5,000 mcg by mouth daily., Disp: , Rfl:    dexamethasone  (DECADRON ) 4 MG tablet, Take 1 tablet day before chemo and 1 tablet day after chemo with food, Disp: 8 tablet, Rfl: 0   DULoxetine  (CYMBALTA ) 30 MG capsule, Take 30-60 mg by mouth See admin instructions. Take 30 mg in the morning and 60 mg at bedtime, Disp: , Rfl:    EMGALITY 120 MG/ML SOAJ, Inject 120 mg into the skin every 30 (thirty) days., Disp: , Rfl:    Ferrous Sulfate (IRON PO), Take 1 tablet by mouth daily. Gummy, Disp: , Rfl:    fluticasone  (FLONASE ) 50 MCG/ACT nasal spray, Place 2 sprays into both nostrils daily for 7 days., Disp: 16 g, Rfl: 2   gabapentin  (NEURONTIN ) 300 MG capsule, Take 1 capsule (300 mg total) by mouth 2 (two) times daily., Disp: 14 capsule, Rfl: 6   ibuprofen  (ADVIL ) 600 MG tablet, Take 1 tablet (600 mg total) by mouth every 8 (eight) hours as needed., Disp: 30 tablet, Rfl: 0   lidocaine -prilocaine  (EMLA ) cream, Apply to affected area once, Disp: 30 g, Rfl: 3   methocarbamol  (ROBAXIN ) 750 MG tablet, Take 750 mg by mouth., Disp: , Rfl:    Multiple Vitamins-Minerals (MULTIVITAMIN WITH MINERALS) tablet, Take 1 tablet by mouth daily. Gummy, Disp: , Rfl:    omeprazole  (PRILOSEC) 20 MG capsule, Take 20 mg by mouth 2 (two) times daily before a meal., Disp: ,  Rfl:    ondansetron  (ZOFRAN ) 8 MG tablet, Take 1 tablet (8 mg total) by mouth every 8 (eight) hours as needed for nausea or vomiting. Start on the third day after chemotherapy., Disp: 30 tablet, Rfl: 1   Probiotic Product (PROBIOTIC PO), Take 1 tablet by mouth daily. 30 billion, Disp: , Rfl:    prochlorperazine  (COMPAZINE ) 10 MG tablet, Take 1 tablet (10 mg total) by mouth every 6 (six) hours as needed for nausea or vomiting., Disp: 30 tablet, Rfl: 1  PHYSICAL EXAM ECOG FS:1 - Symptomatic but completely ambulatory    Vitals:   11/12/23 1020 11/12/23 1100  BP: 98/75 100/67  Pulse: (!) 109 99  Resp: 20 20  Temp: 97.9 F (36.6 C)   SpO2: 100% 100%  Weight: 175 lb (79.4 kg)    Physical Exam Vitals and nursing note reviewed.  Constitutional:      Appearance: She is not ill-appearing or toxic-appearing.  HENT:     Head: Normocephalic.     Mouth/Throat:     Mouth: Mucous membranes are moist.  Eyes:     Conjunctiva/sclera: Conjunctivae normal.  Cardiovascular:     Rate and Rhythm: Normal rate.  Pulmonary:     Effort: Pulmonary effort is normal.  Abdominal:     General: There is no distension.     Palpations: Abdomen is soft.     Tenderness: There is no right CVA tenderness, left CVA tenderness, guarding or rebound.     Comments: Suprapubic tenderness without peritoneal signs  Musculoskeletal:     Cervical back: Normal range of motion.  Skin:    General: Skin is warm and dry.  Neurological:     Mental Status: She is alert.        LABORATORY DATA I have reviewed the data as listed    Latest Ref Rng & Units 11/08/2023    7:54 AM 06/11/2023    9:31 AM 08/11/2022   12:29 AM  CBC  WBC 4.0 - 10.5 K/uL 5.9  6.0  8.8   Hemoglobin 12.0 - 15.0 g/dL 87.6  85.9  86.4   Hematocrit 36.0 - 46.0 % 36.6  41.7  42.0   Platelets 150 - 400 K/uL 296  300  312         Latest Ref Rng & Units  11/08/2023    7:54 AM 06/11/2023    9:31 AM 01/22/2023   11:18 AM  CMP  Glucose 70 - 99 mg/dL 87  84  86   BUN 6 - 20 mg/dL 10  10  7    Creatinine 0.44 - 1.00 mg/dL 9.19  9.38  9.28   Sodium 135 - 145 mmol/L 137  136  139   Potassium 3.5 - 5.1 mmol/L 4.2  4.0  3.9   Chloride 98 - 111 mmol/L 104  103  103   CO2 22 - 32 mmol/L 28  28  20    Calcium 8.9 - 10.3 mg/dL 9.0  9.2  9.3   Total Protein 6.5 - 8.1 g/dL 6.4     Total Bilirubin 0.0 - 1.2 mg/dL 0.3     Alkaline Phos 38 - 126 U/L 47     AST 15 - 41 U/L 14     ALT 0 - 44 U/L 11          RADIOGRAPHIC STUDIES (from last 24 hours if applicable) I have personally reviewed the radiological images as listed and agreed with the findings in the report. No results found.  Visit Diagnosis: 1. Malignant neoplasm of upper-inner quadrant of left breast in female, estrogen receptor negative (HCC)   2. Urine frequency   3. Constipation, unspecified constipation type      No orders of the defined types were placed in this encounter.   All questions were answered. The patient knows to call the clinic with any problems, questions or concerns. No barriers to learning was detected.  A total of more than 30 minutes were spent on this encounter with face-to-face time and non-face-to-face time, including preparing to see the patient, ordering tests and/or medications, counseling the patient and coordination of care as outlined above.    Thank you for allowing me to participate in the care of this patient.    Lexandra Rettke E  Walisiewicz, PA-C Department of Hematology/Oncology [facility] at [facility] Phone: [phone]  [phone]    11/12/2023 12:14 PM

## 2023-11-14 LAB — URINE CULTURE: Culture: NO GROWTH

## 2023-11-15 ENCOUNTER — Telehealth: Payer: Self-pay

## 2023-11-15 NOTE — Telephone Encounter (Signed)
 Left voicemail for patient regarding recent urinalysis (UA) results. Patient informed that her UA was reviewed by Mallie Combes, PA-C, and Dr. Gudena, and results were negative. She may continue taking Pyridium for discomfort and use either Advil  or Motrin  for ongoing pain. Discussed that her back pain is likely related to the G-CSF injection received on 10/9. Recommended supportive measures including Claritin for the next few days and warm compresses in combination with pain medication to help alleviate symptoms. Provided call-back number for any additional questions or concerns.

## 2023-11-28 ENCOUNTER — Emergency Department (HOSPITAL_COMMUNITY)

## 2023-11-28 ENCOUNTER — Other Ambulatory Visit: Payer: Self-pay

## 2023-11-28 ENCOUNTER — Encounter (HOSPITAL_COMMUNITY): Payer: Self-pay

## 2023-11-28 ENCOUNTER — Emergency Department (HOSPITAL_COMMUNITY)
Admission: EM | Admit: 2023-11-28 | Discharge: 2023-11-29 | Disposition: A | Discharge status: Home / Self Care | Attending: Emergency Medicine | Admitting: Emergency Medicine

## 2023-11-28 DIAGNOSIS — R0989 Other specified symptoms and signs involving the circulatory and respiratory systems: Secondary | ICD-10-CM | POA: Insufficient documentation

## 2023-11-28 DIAGNOSIS — Z91040 Latex allergy status: Secondary | ICD-10-CM | POA: Diagnosis not present

## 2023-11-28 DIAGNOSIS — Z8673 Personal history of transient ischemic attack (TIA), and cerebral infarction without residual deficits: Secondary | ICD-10-CM | POA: Insufficient documentation

## 2023-11-28 DIAGNOSIS — R0789 Other chest pain: Secondary | ICD-10-CM | POA: Diagnosis not present

## 2023-11-28 DIAGNOSIS — R071 Chest pain on breathing: Secondary | ICD-10-CM | POA: Diagnosis not present

## 2023-11-28 DIAGNOSIS — Z0389 Encounter for observation for other suspected diseases and conditions ruled out: Secondary | ICD-10-CM | POA: Diagnosis not present

## 2023-11-28 DIAGNOSIS — G8918 Other acute postprocedural pain: Secondary | ICD-10-CM | POA: Diagnosis not present

## 2023-11-28 DIAGNOSIS — Z853 Personal history of malignant neoplasm of breast: Secondary | ICD-10-CM | POA: Diagnosis not present

## 2023-11-28 DIAGNOSIS — R079 Chest pain, unspecified: Secondary | ICD-10-CM | POA: Diagnosis not present

## 2023-11-28 LAB — COMPREHENSIVE METABOLIC PANEL WITH GFR
ALT: 12 U/L (ref 0–44)
AST: 17 U/L (ref 15–41)
Albumin: 3.8 g/dL (ref 3.5–5.0)
Alkaline Phosphatase: 58 U/L (ref 38–126)
Anion gap: 9 (ref 5–15)
BUN: 13 mg/dL (ref 6–20)
CO2: 27 mmol/L (ref 22–32)
Calcium: 9.5 mg/dL (ref 8.9–10.3)
Chloride: 103 mmol/L (ref 98–111)
Creatinine, Ser: 0.79 mg/dL (ref 0.44–1.00)
GFR, Estimated: >60 mL/min (ref >60)
Glucose, Bld: 92 mg/dL (ref 70–99)
Potassium: 3.9 mmol/L (ref 3.5–5.1)
Sodium: 139 mmol/L (ref 135–145)
Total Bilirubin: 0.2 mg/dL (ref 0.0–1.2)
Total Protein: 6.6 g/dL (ref 6.5–8.1)

## 2023-11-28 LAB — CBC WITH DIFFERENTIAL/PLATELET
Abs Immature Granulocytes: 0.03 K/uL (ref 0.00–0.07)
Basophils Absolute: 0.2 K/uL — ABNORMAL HIGH (ref 0.0–0.1)
Basophils Relative: 3 %
Eosinophils Absolute: 0.1 K/uL (ref 0.0–0.5)
Eosinophils Relative: 1 %
HCT: 37.1 % (ref 36.0–46.0)
Hemoglobin: 11.7 g/dL — ABNORMAL LOW (ref 12.0–15.0)
Immature Granulocytes: 0 %
Lymphocytes Relative: 32 %
Lymphs Abs: 2.2 K/uL (ref 0.7–4.0)
MCH: 29.5 pg (ref 26.0–34.0)
MCHC: 31.5 g/dL (ref 30.0–36.0)
MCV: 93.7 fL (ref 80.0–100.0)
Monocytes Absolute: 0.7 K/uL (ref 0.1–1.0)
Monocytes Relative: 11 %
Neutro Abs: 3.7 K/uL (ref 1.7–7.7)
Neutrophils Relative %: 53 %
Platelets: 486 K/uL — ABNORMAL HIGH (ref 150–400)
RBC: 3.96 MIL/uL (ref 3.87–5.11)
RDW: 13.9 % (ref 11.5–15.5)
WBC: 6.9 K/uL (ref 4.0–10.5)
nRBC: 0 % (ref 0.0–0.2)

## 2023-11-28 LAB — TROPONIN T, HIGH SENSITIVITY: Troponin T High Sensitivity: <15 ng/L (ref 0–19)

## 2023-11-28 LAB — PROTIME-INR
INR: 0.9 (ref 0.8–1.2)
Prothrombin Time: 13.1 s (ref 11.4–15.2)

## 2023-11-28 LAB — I-STAT CG4 LACTIC ACID, ED: Lactic Acid, Venous: 0.8 mmol/L (ref 0.5–1.9)

## 2023-11-28 LAB — HCG, SERUM, QUALITATIVE: Preg, Serum: NEGATIVE

## 2023-11-28 LAB — PRO BRAIN NATRIURETIC PEPTIDE: Pro Brain Natriuretic Peptide: 70.5 pg/mL (ref <300.0)

## 2023-11-28 MED ORDER — IOHEXOL 350 MG/ML SOLN
75.0000 mL | Freq: Once | INTRAVENOUS | Status: AC | PRN
  Administered 2023-11-28: 75 mL via INTRAVENOUS

## 2023-11-28 NOTE — ED Provider Notes (Signed)
 [place] EMERGENCY DEPARTMENT AT [facility] Provider Note   CSN: [id number] Arrival date & time: 11/28/23  1956     History {Add pertinent medical, surgical, social history, OB history to HPI:1} Chief Complaint  Patient presents with  . Shortness of Breath    Tonya Andrews is a 48 y.o. female with history of breast cancer status post bilateral mastectomy followed by subpectoral tissue expander reconstruction on 10/01/2023 who presents with from home with husband via POV. Pt reports rattle in chest for a couple days. Pt denies SOB, denies cough. Endorses not feeling well, fatigued, lightheaded. Pt does endorse R lower CP upon deep inspiration. Pt currently receiving chemotherapy last treatment Oct 7th.  Has tissue expanders in place after mastectomy, and the right expander has been painful for the last week or so.    Past Medical History:  Diagnosis Date  . Arthritis   . Celiac disease   . Fibromyalgia   . Gallstones   . GERD (gastroesophageal reflux disease)   . Migraine headache    Dr Oneita  . Nasal polyp   . Nerve pain   . PFO (patent foramen ovale) 04/2009   by bubble study/TEE, Dr Wonda, cardiology  . PONV (postoperative nausea and vomiting)   . Seasonal allergies   . Stroke [facility]) 04/2009, 07/2009   Dr Rosemarie right arm weakness       Home Medications Prior to Admission medications   Medication Sig Start Date End Date Taking? Authorizing Provider  acetaminophen  (TYLENOL ) 500 MG tablet Take 1 tablet (500 mg total) by mouth every 6 (six) hours. 09/20/23   Montorfano, Lisandro M, MD  albuterol  (VENTOLIN  HFA) 108 (90 Base) MCG/ACT inhaler Inhale 1-2 puffs into the lungs every 6 (six) hours as needed for wheezing or shortness of breath. 12/20/20   Elnor Jayson LABOR, DO  cetirizine  (ZYRTEC ) 10 MG tablet Take 10 mg by mouth daily.    [provider]  Cyanocobalamin  (VITAMIN B-12) 5000 MCG TBDP Take 5,000 mcg by mouth daily.    [provider]   dexamethasone  (DECADRON ) 4 MG tablet Take 1 tablet day before chemo and 1 tablet day after chemo with food 10/11/23   Gudena, Vinay, MD  DULoxetine  (CYMBALTA ) 30 MG capsule Take 30-60 mg by mouth See admin instructions. Take 30 mg in the morning and 60 mg at bedtime 04/08/20   [provider]  EMGALITY 120 MG/ML SOAJ Inject 120 mg into the skin every 30 (thirty) days.    [provider]  Ferrous Sulfate (IRON PO) Take 1 tablet by mouth daily. Gummy    [provider]  fluticasone  (FLONASE ) 50 MCG/ACT nasal spray Place 2 sprays into both nostrils daily for 7 days. 12/20/20 11/08/23  Elnor Jayson LABOR, DO  gabapentin  (NEURONTIN ) 300 MG capsule Take 1 capsule (300 mg total) by mouth 2 (two) times daily. 11/10/23   Montorfano, Lisandro M, MD  ibuprofen  (ADVIL ) 600 MG tablet Take 1 tablet (600 mg total) by mouth every 8 (eight) hours as needed. 09/17/23   Montorfano, Lisandro M, MD  lidocaine -prilocaine  (EMLA ) cream Apply to affected area once 10/11/23   Gudena, Vinay, MD  methocarbamol  (ROBAXIN ) 750 MG tablet Take 750 mg by mouth.    [provider]  Multiple Vitamins-Minerals (MULTIVITAMIN WITH MINERALS) tablet Take 1 tablet by mouth daily. Gummy    [provider]  omeprazole (PRILOSEC) 20 MG capsule Take 20 mg by mouth 2 (two) times daily before a meal.  [provider]  ondansetron  (ZOFRAN ) 8 MG tablet Take 1 tablet (8 mg total) by mouth every 8 (eight) hours as needed for nausea or vomiting. Start on the third day after chemotherapy. 10/11/23   Gudena, Vinay, MD  Probiotic Product (PROBIOTIC PO) Take 1 tablet by mouth daily. 30 billion    [provider]  prochlorperazine  (COMPAZINE ) 10 MG tablet Take 1 tablet (10 mg total) by mouth every 6 (six) hours as needed for nausea or vomiting. 10/11/23   Gudena, Vinay, MD      Allergies    Amoxicillin, Nifedipine, Rho d immune globulin, and Latex    Review of Systems   Review of Systems A 10 point  review of systems was performed and is negative unless otherwise reported in HPI.  Physical Exam Updated Vital Signs BP 104/80   Pulse 93   Temp 98.3 F (36.8 C) (Oral)   Resp 17   Ht 5' 5 (1.651 m)   Wt 72.6 kg   LMP 11/17/2023 (Approximate)   SpO2 98%   BMI 26.63 kg/m  Physical Exam General: Normal appearing female, lying in bed.  HEENT: PERRLA, Sclera anicteric, MMM, trachea midline.  Cardiology: RRR, no murmurs/rubs/gallops. BL radial and DP pulses equal bilaterally.  Resp: Normal respiratory rate and effort. CTAB, no wheezes, rhonchi, crackles.  Abd: Soft, non-tender, non-distended. No rebound tenderness or guarding.  GU: Deferred. MSK: No peripheral edema or signs of trauma. Extremities without deformity or TTP. No cyanosis or clubbing. Skin: warm, dry. No rashes or lesions. Back: No CVA tenderness Neuro: A&Ox4, CNs II-XII grossly intact. MAEs. Sensation grossly intact.  Psych: Normal mood and affect.   ED Results / Procedures / Treatments   Labs (all labs ordered are listed, but only abnormal results are displayed) Labs Reviewed  CBC WITH DIFFERENTIAL/PLATELET - Abnormal; Notable for the following components:      Result Value   Hemoglobin 11.7 (*)    Platelets 486 (*)    Basophils Absolute 0.2 (*)    All other components within normal limits  CULTURE, BLOOD (ROUTINE X 2)  CULTURE, BLOOD (ROUTINE X 2)  COMPREHENSIVE METABOLIC PANEL WITH GFR  PROTIME-INR  HCG, SERUM, QUALITATIVE  PRO BRAIN NATRIURETIC PEPTIDE  URINALYSIS, W/ REFLEX TO CULTURE (INFECTION SUSPECTED)  I-STAT CG4 LACTIC ACID, ED  TROPONIN T, HIGH SENSITIVITY    EKG None  Radiology DG Chest 2 View if patient is not in a treatment room. Result Date: 11/28/2023 EXAM: 2 VIEW(S) XRAY OF THE CHEST 11/28/2023 09:06:55 PM COMPARISON: 10/01/2023 CLINICAL HISTORY: Suspected Sepsis. Per pt notes: Pt from home with husband via POV. Pt reports rattle in chest for a couple days. Pt denies SOB, denies  cough. Pt does endorse R lower CP upon deep inspiration. FINDINGS: LINES, TUBES AND DEVICES: Right chest port in place. Atrial occlusion device noted. LUNGS AND PLEURA: Improved aeration of the lung bases. No focal pulmonary opacity. No pulmonary edema. No pleural effusion. No pneumothorax. HEART AND MEDIASTINUM: No acute abnormality of the cardiac and mediastinal silhouettes. BONES AND SOFT TISSUES: Bilateral breast tissue expanders in place. Cholecystectomy clips noted. No acute osseous abnormality. IMPRESSION: 1. No acute findings. Electronically signed by: Dorethia Molt MD 11/28/2023 09:22 PM EDT RP Workstation: [id number]    Procedures Procedures  {Document cardiac monitor, telemetry assessment procedure when appropriate:1}  Medications Ordered in ED Medications - No data to display  ED Course/ Medical Decision Making/ A&P  Medical Decision Making Amount and/or Complexity of Data Reviewed Labs: ordered. Radiology: ordered.    This patient presents to the ED for concern of ***, this involves an extensive number of treatment options, and is a complaint that carries with it a high risk of complications and morbidity.  I considered the following differential and admission for this acute, potentially life threatening condition.   MDM:    DDX for dyspnea includes but is not limited to:  Cardiac- CHF, Myocardial Ischemia, Valvular heart disease, Arrhythmia, Cardiac tamponade   Respiratory - Pneumonia / atelectasis / pulmonary effusion / cavitary lung disease, Pneumothorax, COPD/ reactive airway disease, PE, ARDS   Other - Sepsis, Anemia     Clinical Course as of 11/28/23 2147  Sun Nov 28, 2023  2146 Pro Brain Natriuretic Peptide: 70.5 [HN]  2147 Lactic Acid, Venous: 0.8 [HN]  2147 Preg, Serum: NEGATIVE [HN]    Clinical Course User Index [HN] Franklyn Sid SAILOR, MD    Labs: I Ordered, and personally interpreted labs.  The pertinent results include:   ***  Imaging Studies ordered: I ordered imaging studies including *** I independently visualized and interpreted imaging. I agree with the radiologist interpretation  Additional history obtained from ***.  External records from outside source obtained and reviewed including ***  Cardiac Monitoring: .The patient was maintained on a cardiac monitor.  I personally viewed and interpreted the cardiac monitored which showed an underlying rhythm of: ***  Reevaluation: After the interventions noted above, I reevaluated the patient and found that they have :{resolved/improved/worsened:23923::improved}  Social Determinants of Health: .***  Disposition:  ***  Co morbidities that complicate the patient evaluation . Past Medical History:  Diagnosis Date  . Arthritis   . Celiac disease   . Fibromyalgia   . Gallstones   . GERD (gastroesophageal reflux disease)   . Migraine headache    Dr Oneita  . Nasal polyp   . Nerve pain   . PFO (patent foramen ovale) 04/2009   by bubble study/TEE, Dr Wonda, cardiology  . PONV (postoperative nausea and vomiting)   . Seasonal allergies   . Stroke [facility]) 04/2009, 07/2009   Dr Rosemarie right arm weakness     Medicines No orders of the defined types were placed in this encounter.   I have reviewed the patients home medicines and have made adjustments as needed  Problem List / ED Course: Problem List Items Addressed This Visit   None        {Document critical care time when appropriate:1} {Document review of labs and clinical decision tools ie heart score, Chads2Vasc2 etc:1}  {Document your independent review of radiology images, and any outside records:1} {Document your discussion with family members, caretakers, and with consultants:1} {Document social determinants of health affecting pt's care:1} {Document your decision making why or why not admission, treatments were needed:1}  This note was created using dictation software, which may  contain spelling or grammatical errors.

## 2023-11-28 NOTE — ED Triage Notes (Signed)
 Pt from home with husband via POV.  Pt reports rattle in chest for a couple days.  Pt denies SOB, denies cough.  Pt does endorse R lower CP upon deep inspiration.    Pt currently receiving chemotherapy last treatment Oct 7th.

## 2023-11-28 NOTE — ED Notes (Signed)
 Patient transported to CT and returned to room.

## 2023-11-28 NOTE — Discharge Instructions (Signed)
 Thank you for coming to [facility] Emergency Department. You were seen for rattling in chest. We did an exam, labs, and imaging, and these showed no acute findings.  Please follow up with your primary care provider within 1 week.   Do not hesitate to return to the ED or call 911 if you experience: -Worsening symptoms -Shortness of breath -Cough -Lightheadedness, passing out -Fevers/chills -Anything else that concerns you

## 2023-11-30 ENCOUNTER — Inpatient Hospital Stay

## 2023-11-30 ENCOUNTER — Encounter: Payer: Self-pay | Admitting: *Deleted

## 2023-11-30 ENCOUNTER — Other Ambulatory Visit: Payer: Self-pay | Admitting: *Deleted

## 2023-11-30 ENCOUNTER — Other Ambulatory Visit (HOSPITAL_COMMUNITY): Payer: Self-pay

## 2023-11-30 ENCOUNTER — Inpatient Hospital Stay (HOSPITAL_BASED_OUTPATIENT_CLINIC_OR_DEPARTMENT_OTHER): Admitting: Hematology and Oncology

## 2023-11-30 VITALS — BP 98/78 | Temp 97.8°F | Resp 18 | Ht 65.0 in | Wt 170.0 lb

## 2023-11-30 VITALS — HR 96

## 2023-11-30 DIAGNOSIS — C50212 Malignant neoplasm of upper-inner quadrant of left female breast: Secondary | ICD-10-CM | POA: Diagnosis not present

## 2023-11-30 DIAGNOSIS — Z171 Estrogen receptor negative status [ER-]: Secondary | ICD-10-CM

## 2023-11-30 DIAGNOSIS — Z5111 Encounter for antineoplastic chemotherapy: Secondary | ICD-10-CM | POA: Diagnosis not present

## 2023-11-30 LAB — CBC WITH DIFFERENTIAL (CANCER CENTER ONLY)
Abs Immature Granulocytes: 0.05 K/uL (ref 0.00–0.07)
Basophils Absolute: 0 K/uL (ref 0.0–0.1)
Basophils Relative: 0 %
Eosinophils Absolute: 0 K/uL (ref 0.0–0.5)
Eosinophils Relative: 0 %
HCT: 33.3 % — ABNORMAL LOW (ref 36.0–46.0)
Hemoglobin: 11.3 g/dL — ABNORMAL LOW (ref 12.0–15.0)
Immature Granulocytes: 1 %
Lymphocytes Relative: 15 %
Lymphs Abs: 1.7 K/uL (ref 0.7–4.0)
MCH: 30.6 pg (ref 26.0–34.0)
MCHC: 33.9 g/dL (ref 30.0–36.0)
MCV: 90.2 fL (ref 80.0–100.0)
Monocytes Absolute: 0.7 K/uL (ref 0.1–1.0)
Monocytes Relative: 6 %
Neutro Abs: 8.5 K/uL — ABNORMAL HIGH (ref 1.7–7.7)
Neutrophils Relative %: 78 %
Platelet Count: 499 K/uL — ABNORMAL HIGH (ref 150–400)
RBC: 3.69 MIL/uL — ABNORMAL LOW (ref 3.87–5.11)
RDW: 13.8 % (ref 11.5–15.5)
WBC Count: 10.9 K/uL — ABNORMAL HIGH (ref 4.0–10.5)
nRBC: 0 % (ref 0.0–0.2)

## 2023-11-30 LAB — CMP (CANCER CENTER ONLY)
ALT: 11 U/L (ref 0–44)
AST: 12 U/L — ABNORMAL LOW (ref 15–41)
Albumin: 3.8 g/dL (ref 3.5–5.0)
Alkaline Phosphatase: 50 U/L (ref 38–126)
Anion gap: 7 (ref 5–15)
BUN: 14 mg/dL (ref 6–20)
CO2: 26 mmol/L (ref 22–32)
Calcium: 9.2 mg/dL (ref 8.9–10.3)
Chloride: 105 mmol/L (ref 98–111)
Creatinine: 0.75 mg/dL (ref 0.44–1.00)
GFR, Estimated: >60 mL/min (ref >60)
Glucose, Bld: 114 mg/dL — ABNORMAL HIGH (ref 70–99)
Potassium: 3.7 mmol/L (ref 3.5–5.1)
Sodium: 138 mmol/L (ref 135–145)
Total Bilirubin: 0.3 mg/dL (ref 0.0–1.2)
Total Protein: 6.6 g/dL (ref 6.5–8.1)

## 2023-11-30 LAB — PREGNANCY, URINE: Preg Test, Ur: NEGATIVE

## 2023-11-30 MED ORDER — SODIUM CHLORIDE 0.9 % IV SOLN
75.0000 mg/m2 | Freq: Once | INTRAVENOUS | Status: AC
  Administered 2023-11-30: 160 mg via INTRAVENOUS
  Filled 2023-11-30: qty 16

## 2023-11-30 MED ORDER — NYSTATIN 100000 UNIT/ML MT SUSP
5.0000 mL | Freq: Three times a day (TID) | OROMUCOSAL | 1 refills | Status: DC | PRN
  Filled 2023-11-30: qty 210, 14d supply, fill #0

## 2023-11-30 MED ORDER — DEXAMETHASONE SOD PHOSPHATE PF 10 MG/ML IJ SOLN
10.0000 mg | Freq: Once | INTRAMUSCULAR | Status: AC
  Administered 2023-11-30: 10 mg via INTRAVENOUS

## 2023-11-30 MED ORDER — SODIUM CHLORIDE 0.9% FLUSH
10.0000 mL | INTRAVENOUS | Status: DC | PRN
  Administered 2023-11-30: 10 mL

## 2023-11-30 MED ORDER — SODIUM CHLORIDE 0.9 % IV SOLN: INTRAVENOUS | Status: DC

## 2023-11-30 MED ORDER — CLINDAMYCIN PHOS-BENZOYL PEROX 1-5 % EX GEL: Freq: Two times a day (BID) | CUTANEOUS | 0 refills | Status: AC

## 2023-11-30 MED ORDER — MAGIC MOUTHWASH W/LIDOCAINE: 5.0000 mL | Freq: Three times a day (TID) | ORAL | 1 refills | Status: DC | PRN

## 2023-11-30 MED ORDER — PALONOSETRON HCL INJECTION 0.25 MG/5ML
0.2500 mg | Freq: Once | INTRAVENOUS | Status: AC
  Administered 2023-11-30: 0.25 mg via INTRAVENOUS
  Filled 2023-11-30: qty 5

## 2023-11-30 MED ORDER — DIPHENOXYLATE-ATROPINE 2.5-0.025 MG PO TABS: 1.0000 | ORAL_TABLET | Freq: Four times a day (QID) | ORAL | 0 refills | Status: AC | PRN

## 2023-11-30 MED ORDER — SODIUM CHLORIDE 0.9 % IV SOLN
600.0000 mg/m2 | Freq: Once | INTRAVENOUS | Status: AC
  Administered 2023-11-30: 1160 mg via INTRAVENOUS
  Filled 2023-11-30: qty 58

## 2023-11-30 NOTE — Assessment & Plan Note (Signed)
 10/01/2023:Bilateral mastectomies Left mastectomy: Grade 3 IDC 0.7 cm, margins negative, 0/3 lymph nodes negative, ER 0%, PR 0%, HER2 -1+, Ki-67 40% Right mastectomy: Benign   Treatment plan: Adjuvant chemotherapy with Taxotere and Cytoxan every 3 weeks x 4 (starting 10/8 ) Adjuvant radiation therapy -------------------------------------------------------------------------------------------------------------------------------------- Current treatment: Cycle 2 Taxotere and Cytoxan (Dignicap) started 11/09/2023  Chemo toxicities:  Return to clinic in 3 weeks for cycle 3

## 2023-11-30 NOTE — Progress Notes (Signed)
 Patient Care Team: Verena Mems, MD as PCP - General (Family Medicine) Lonni Slain, MD as PCP - Cardiology (Cardiology) Tyree Nanetta SAILOR, RN as Oncology Nurse Navigator Gerome, Devere HERO, RN as Registered Nurse Odean Potts, MD as Consulting Physician (Hematology and Oncology) Vernetta Berg, MD as Consulting Physician (General Surgery)  DIAGNOSIS:  Encounter Diagnosis  Name Primary?   Malignant neoplasm of upper-inner quadrant of left breast in female, estrogen receptor negative (HCC) Yes    SUMMARY OF ONCOLOGIC HISTORY: Oncology History  Malignant neoplasm of upper-inner quadrant of left breast in female, estrogen receptor negative (HCC)  08/19/2023 Initial Diagnosis   Mammogram and ultrasound detected left breast mass 0.6 cm, biopsy: Grade 2 IDC ER 0%, PR 0%, Ki67 40%, HER2 1+ negative    10/01/2023 Surgery   Bilateral mastectomies Left mastectomy: Grade 3 IDC 0.7 cm, margins negative, 0/3 lymph nodes negative, ER 0%, PR 0%, HER2 -1+, Ki-67 40% Right mastectomy: Benign   10/11/2023 Cancer Staging   Staging form: Breast, AJCC 8th Edition - Clinical stage from 10/11/2023: Stage IB (cT1b, cN0, cM0, G3, ER-, PR-, HER2-) - Signed by Odean Potts, MD on 10/11/2023 Histologic grading system: 3 grade system   11/09/2023 -  Chemotherapy   Patient is on Treatment Plan : BREAST TC q21d       CHIEF COMPLIANT:   HISTORY OF PRESENT ILLNESS: Discussed the use of AI scribe software for clinical note transcription with the patient, who gave verbal consent to proceed.  History of Present Illness Tonya Andrews is a 48 year old female undergoing chemotherapy who presents with side effects from the treatment.  She experiences significant diarrhea beginning three to four days post-chemotherapy, lasting about a week, occurring more than three times daily. She uses Imodium, doubling the usual dose, but continues to have stomach discomfort and struggles with fluid intake. She  manages electrolyte balance with daily Gatorade and vitamins.  Mouth sores have improved with a dry mouth rinse. She has a painful, persistent cystic acne breakout, managed with a clay mask cream and regular face washing.  She experiences significant fatigue and hair loss, with hair falling out in large clumps despite using a cooling cap. A metallic taste in her mouth affects her ability to taste food, including citrus flavors, and she uses plastic utensils to mitigate this.     ALLERGIES:  is allergic to amoxicillin, nifedipine, rho d immune globulin, and latex.  MEDICATIONS:  Current Outpatient Medications  Medication Sig Dispense Refill   albuterol  (VENTOLIN  HFA) 108 (90 Base) MCG/ACT inhaler Inhale 1-2 puffs into the lungs every 6 (six) hours as needed for wheezing or shortness of breath. 8.5 g 0   cetirizine  (ZYRTEC ) 10 MG tablet Take 10 mg by mouth daily.     Cyanocobalamin  (VITAMIN B-12) 5000 MCG TBDP Take 5,000 mcg by mouth daily.     dexamethasone  (DECADRON ) 4 MG tablet Take 1 tablet day before chemo and 1 tablet day after chemo with food 8 tablet 0   DULoxetine  (CYMBALTA ) 30 MG capsule Take 30-60 mg by mouth See admin instructions. Take 30 mg in the morning and 60 mg at bedtime     EMGALITY 120 MG/ML SOAJ Inject 120 mg into the skin every 30 (thirty) days.     Ferrous Sulfate (IRON PO) Take 1 tablet by mouth daily. Gummy     fluticasone  (FLONASE ) 50 MCG/ACT nasal spray Place 2 sprays into both nostrils daily for 7 days. 16 g 2   gabapentin  (NEURONTIN ) 300  MG capsule Take 1 capsule (300 mg total) by mouth 2 (two) times daily. 14 capsule 6   ibuprofen  (ADVIL ) 600 MG tablet Take 1 tablet (600 mg total) by mouth every 8 (eight) hours as needed. 30 tablet 0   lidocaine -prilocaine  (EMLA ) cream Apply to affected area once 30 g 3   methocarbamol  (ROBAXIN ) 750 MG tablet Take 750 mg by mouth every 6 (six) hours as needed.     Multiple Vitamins-Minerals (MULTIVITAMIN WITH MINERALS) tablet  Take 1 tablet by mouth daily. Gummy     omeprazole (PRILOSEC) 20 MG capsule Take 20 mg by mouth 2 (two) times daily before a meal.     ondansetron  (ZOFRAN ) 8 MG tablet Take 1 tablet (8 mg total) by mouth every 8 (eight) hours as needed for nausea or vomiting. Start on the third day after chemotherapy. 30 tablet 1   Probiotic Product (PROBIOTIC PO) Take 1 tablet by mouth daily. 30 billion     prochlorperazine  (COMPAZINE ) 10 MG tablet Take 1 tablet (10 mg total) by mouth every 6 (six) hours as needed for nausea or vomiting. 30 tablet 1   acetaminophen  (TYLENOL ) 500 MG tablet Take 1 tablet (500 mg total) by mouth every 6 (six) hours. (Patient taking differently: Take 500 mg by mouth every 6 (six) hours as needed.) 30 tablet 0   No current facility-administered medications for this visit.    PHYSICAL EXAMINATION: ECOG PERFORMANCE STATUS: 1 - Symptomatic but completely ambulatory  Vitals:   11/30/23 0928  BP: 98/78  Resp: 18  Temp: 97.8 F (36.6 C)  SpO2: 98%   Filed Weights   11/30/23 0928  Weight: 170 lb (77.1 kg)    Physical Exam   (exam performed in the presence of a chaperone)  LABORATORY DATA:  I have reviewed the data as listed    Latest Ref Rng & Units 11/28/2023    8:44 PM 11/08/2023    7:54 AM 06/11/2023    9:31 AM  CMP  Glucose 70 - 99 mg/dL 92  87  84   BUN 6 - 20 mg/dL 13  10  10    Creatinine 0.44 - 1.00 mg/dL 9.20  9.19  9.38   Sodium 135 - 145 mmol/L 139  137  136   Potassium 3.5 - 5.1 mmol/L 3.9  4.2  4.0   Chloride 98 - 111 mmol/L 103  104  103   CO2 22 - 32 mmol/L 27  28  28    Calcium 8.9 - 10.3 mg/dL 9.5  9.0  9.2   Total Protein 6.5 - 8.1 g/dL 6.6  6.4    Total Bilirubin 0.0 - 1.2 mg/dL 0.2  0.3    Alkaline Phos 38 - 126 U/L 58  47    AST 15 - 41 U/L 17  14    ALT 0 - 44 U/L 12  11      Lab Results  Component Value Date   WBC 10.9 (H) 11/30/2023   HGB 11.3 (L) 11/30/2023   HCT 33.3 (L) 11/30/2023   MCV 90.2 11/30/2023   PLT 499 (H) 11/30/2023    NEUTROABS 8.5 (H) 11/30/2023    ASSESSMENT & PLAN:  Malignant neoplasm of upper-inner quadrant of left breast in female, estrogen receptor negative (HCC) 10/01/2023:Bilateral mastectomies Left mastectomy: Grade 3 IDC 0.7 cm, margins negative, 0/3 lymph nodes negative, ER 0%, PR 0%, HER2 -1+, Ki-67 40% Right mastectomy: Benign   Treatment plan: Adjuvant chemotherapy with Taxotere and Cytoxan every 3 weeks x  4 (starting 10/8 ) Adjuvant radiation therapy -------------------------------------------------------------------------------------------------------------------------------------- Current treatment: Cycle 2 Taxotere and Cytoxan (Dignicap) started 11/09/2023  Chemo toxicities: Severe diarrhea lasted a week: I sent a new prescription for Lomotil.  In spite of the diarrhea she is able to maintain her fluid and electrolyte balance extremely well Complete loss of taste Acneform rash on the jaw: Send a prescription for BenzaClin Mouth sores: Sent a prescription for Magic mouthwash Severe fatigue Chemo-induced anemia  Return to clinic in 3 weeks for cycle 3 ------------------------------------- Assessment and Plan Assessment & Plan Malignant neoplasm of upper-inner quadrant of left breast Undergoing chemotherapy with multiple side effects.  Chemotherapy-induced diarrhea Severe diarrhea post-chemotherapy with limited relief from Imodium. Risk of dehydration noted. - Prescribe Lomotil for stronger anti-diarrheal effect. - Encourage fluid intake to prevent dehydration.  Chemotherapy-induced oral mucositis Mouth sores causing discomfort and affecting eating. Discussed prescription mouthwash with polysorbate 80, past reaction noted but low risk for oral use. - Prescribe Magic Mouthwash containing Maalox, lidocaine , and other components for pain relief.  Chemotherapy-induced acneiform skin eruption Cystic acne-like lesions causing discomfort. - Prescribe antibiotic cream for acne  relief.  Chemotherapy-induced alopecia Significant hair loss despite cold cap use. Hair regrowth expected to be faster with cold cap.  Chemotherapy-induced fatigue Significant fatigue expected as a side effect of chemotherapy.  Chemotherapy-induced dysgeusia Metallic taste affecting food intake. Suggested citrus flavors and plastic utensils to mitigate taste changes. - Try using plastic utensils to reduce metallic taste. - Experiment with herbs and seasonings to enhance food flavor.  Chemotherapy-induced anemia Mild anemia with hemoglobin drop from 12.3 to 11.3, expected with chemotherapy.  Gastroesophageal reflux disease (GERD) Chronic acid reflux possibly exacerbated by chemotherapy. Recent episode of wheezing likely related to GERD. Recent clear chest CT and X-ray. - Continue current acid reflux medication.  Polysorbate 80 sensitivity Past reaction to polysorbate 80 noted. Current prescription mouthwash contains polysorbate 80, but risk considered low for oral use. - Proceed with prescribing Magic Mouthwash.      No orders of the defined types were placed in this encounter.  The patient has a good understanding of the overall plan. she agrees with it. she will call with any problems that may develop before the next visit here.  I personally spent a total of 45 minutes in the care of the patient today including preparing to see the patient, getting/reviewing separately obtained history, performing a medically appropriate exam/evaluation, counseling and educating, placing orders, referring and communicating with other health care professionals, documenting clinical information in the EHR, independently interpreting results, communicating results, and coordinating care.   Viinay K Leinani Lisbon, MD 11/30/23

## 2023-12-02 ENCOUNTER — Encounter: Payer: Self-pay | Admitting: *Deleted

## 2023-12-02 ENCOUNTER — Other Ambulatory Visit: Payer: Self-pay | Admitting: *Deleted

## 2023-12-02 ENCOUNTER — Inpatient Hospital Stay

## 2023-12-02 VITALS — BP 94/74 | HR 114 | Temp 98.2°F | Resp 16

## 2023-12-02 DIAGNOSIS — Z1722 Progesterone receptor negative status: Secondary | ICD-10-CM | POA: Diagnosis not present

## 2023-12-02 DIAGNOSIS — Z91040 Latex allergy status: Secondary | ICD-10-CM | POA: Diagnosis not present

## 2023-12-02 DIAGNOSIS — Z1732 Human epidermal growth factor receptor 2 negative status: Secondary | ICD-10-CM | POA: Diagnosis not present

## 2023-12-02 DIAGNOSIS — C50212 Malignant neoplasm of upper-inner quadrant of left female breast: Secondary | ICD-10-CM | POA: Diagnosis not present

## 2023-12-02 DIAGNOSIS — Z5111 Encounter for antineoplastic chemotherapy: Secondary | ICD-10-CM | POA: Diagnosis not present

## 2023-12-02 DIAGNOSIS — Z171 Estrogen receptor negative status [ER-]: Secondary | ICD-10-CM

## 2023-12-02 DIAGNOSIS — Z79899 Other long term (current) drug therapy: Secondary | ICD-10-CM | POA: Diagnosis not present

## 2023-12-02 DIAGNOSIS — T451X5A Adverse effect of antineoplastic and immunosuppressive drugs, initial encounter: Secondary | ICD-10-CM | POA: Diagnosis not present

## 2023-12-02 DIAGNOSIS — Z9013 Acquired absence of bilateral breasts and nipples: Secondary | ICD-10-CM | POA: Diagnosis not present

## 2023-12-02 DIAGNOSIS — D6481 Anemia due to antineoplastic chemotherapy: Secondary | ICD-10-CM | POA: Diagnosis not present

## 2023-12-02 MED ORDER — PEGFILGRASTIM-JMDB 6 MG/0.6ML ~~LOC~~ SOSY
6.0000 mg | PREFILLED_SYRINGE | Freq: Once | SUBCUTANEOUS | Status: AC
  Administered 2023-12-02: 6 mg via SUBCUTANEOUS
  Filled 2023-12-02: qty 0.6

## 2023-12-02 MED ORDER — SODIUM CHLORIDE 0.9 % IV SOLN: Freq: Once | INTRAVENOUS | Status: AC

## 2023-12-02 NOTE — Progress Notes (Signed)
 Patient received 1 L NS over 1 hr per Dr Odean. Following completion of fluids, pt reported feeling nauseous but did feel a little better from when she initially came in. (See previous RN note 12/02/23)  Patient voiced she did feel like she could benefit from an additional liter of fluid but will also increase PO intake at home as well. This RN reached out to Larraine, [place] who will get patient scheduled for IVF on Friday 12/03/23.  Patient was discharged home ambulatory, VSS.

## 2023-12-02 NOTE — Progress Notes (Signed)
 Per MD okay to give IVF over 1 hr today.

## 2023-12-02 NOTE — Patient Instructions (Signed)

## 2023-12-02 NOTE — Progress Notes (Unsigned)
 Patient here for Fuphila injection.  BP 89/73 HR 114.  C/O headaches, dizziness, nausea, almost passed out this morining in shower, no appetite, drank 20 oz. Gatorade, 40 oz. Water since yesterday.  Secure chatted Dr.Gudena, Kelsey/RN.  Patient will proceed with injection per Dr. Gudena

## 2023-12-02 NOTE — Progress Notes (Signed)
 Pt presented to injection room today for Fulphila injection.  Pt BP 89/73, HR 114 and experiencing headache, dizziness, nausea and lightheadedness.  Per MD pt needing 1 unit NS today and re-assess VS afterwards to determine if pt needs fluids on Saturday as well.  Charge nurse notified of need for today, orders placed under sign and held.

## 2023-12-02 NOTE — Progress Notes (Addendum)
 Pt completed 1 L NS today today due to hypotension.  Post 1 L NS pt states she feels like she would benefit from an additional 1L on Friday.  Orders placed under sign and held for pt to receive 1 L NS over 1 hour.  Message sent to charge nurse and scheduling team.

## 2023-12-03 ENCOUNTER — Inpatient Hospital Stay

## 2023-12-03 DIAGNOSIS — Z5111 Encounter for antineoplastic chemotherapy: Secondary | ICD-10-CM | POA: Diagnosis not present

## 2023-12-03 DIAGNOSIS — Z171 Estrogen receptor negative status [ER-]: Secondary | ICD-10-CM

## 2023-12-03 DIAGNOSIS — Z1732 Human epidermal growth factor receptor 2 negative status: Secondary | ICD-10-CM | POA: Diagnosis not present

## 2023-12-03 LAB — CULTURE, BLOOD (ROUTINE X 2)
Culture: NO GROWTH
Culture: NO GROWTH
Special Requests: ADEQUATE
Special Requests: ADEQUATE

## 2023-12-03 MED ORDER — SODIUM CHLORIDE 0.9 % IV SOLN: Freq: Once | INTRAVENOUS | Status: AC

## 2023-12-03 NOTE — Patient Instructions (Signed)

## 2023-12-06 ENCOUNTER — Other Ambulatory Visit (HOSPITAL_COMMUNITY): Payer: Self-pay

## 2023-12-06 ENCOUNTER — Telehealth: Payer: Self-pay

## 2023-12-06 ENCOUNTER — Other Ambulatory Visit: Payer: Self-pay | Admitting: Hematology and Oncology

## 2023-12-06 MED ORDER — ZOLPIDEM TARTRATE 5 MG PO TABS: 5.0000 mg | ORAL_TABLET | Freq: Every evening | ORAL | 0 refills | Status: AC | PRN

## 2023-12-06 MED ORDER — OMEPRAZOLE 20 MG PO CPDR: 20.0000 mg | DELAYED_RELEASE_CAPSULE | Freq: Two times a day (BID) | ORAL | 3 refills | Status: AC

## 2023-12-06 NOTE — Telephone Encounter (Signed)
 Pt called and reports that she is having difficulty sleeping d/t pain in her extremities which started after she received her G-CSF. She reports she is taking cetirizine  as recommended after the injection, as well as alternated tylenol /advil . She does take Cymbalta  prescribed for her fibromyalgia. She is asking for a sleep aid, as she has exhausted all OTC options with no help. She also states she is having a great deal of anxiety. Recommended pt reach out to her PCP to help with managing anxiety, and if they are unable to, we would be glad to refer her to psychiatry to manage this.   Her main concern is lack of sleep, as she states she is only sleeping 2 hr per night and is asking for sleep aid. This request given to MD and advised pt we would be in touch,.

## 2023-12-06 NOTE — Progress Notes (Signed)
 Patient is experiencing severe insomnia and spite of over-the-counter medications.  I sent a prescription for Ambien 5 mg to be used at night on an as needed basis.

## 2023-12-08 ENCOUNTER — Encounter: Payer: Self-pay | Admitting: *Deleted

## 2023-12-08 ENCOUNTER — Other Ambulatory Visit: Payer: Self-pay

## 2023-12-16 ENCOUNTER — Ambulatory Visit (INDEPENDENT_AMBULATORY_CARE_PROVIDER_SITE_OTHER)

## 2023-12-16 DIAGNOSIS — Z9889 Other specified postprocedural states: Secondary | ICD-10-CM

## 2023-12-16 DIAGNOSIS — Z09 Encounter for follow-up examination after completed treatment for conditions other than malignant neoplasm: Secondary | ICD-10-CM

## 2023-12-16 DIAGNOSIS — Z853 Personal history of malignant neoplasm of breast: Secondary | ICD-10-CM

## 2023-12-16 NOTE — Progress Notes (Signed)
   Established Patient Office Visit  Subjective   Patient ID: Tonya Andrews, female    DOB: 12-31-1975  Age: 48 y.o. MRN: [id number]   HPI Patient is a 48 yo F with history of breast cancer who underwent bilateral mastectomies followed by subpectoral tissue expander reconstruction on 10/01/2023. Incisions C/D/I.  She is undergoing chemotherapy, per patient has had several side effects and is looking forward to finish the therapy.  She believes she will pursue implant based reconstruction after she recovers from chemotherapy.  Patient also complaining about port discomfort, looking forward to remove the port as soon as she finished chemotherapy. Comes accompanied by husband.    Objective:     LMP 11/17/2023 (Approximate)  BP Readings from Last 3 Encounters:  12/03/23 103/66  12/02/23 109/70  12/02/23 94/74      Physical Exam MA as chaperone Incisions C/D/I. Skin intact.  No signs of infection.  No results found for any visits on 12/16/23.  Last CBC Lab Results  Component Value Date   WBC 10.9 (H) 11/30/2023   HGB 11.3 (L) 11/30/2023   HCT 33.3 (L) 11/30/2023   MCV 90.2 11/30/2023   MCH 30.6 11/30/2023   RDW 13.8 11/30/2023   PLT 499 (H) 11/30/2023   Last metabolic panel Lab Results  Component Value Date   GLUCOSE 114 (H) 11/30/2023   NA 138 11/30/2023   K 3.7 11/30/2023   CL 105 11/30/2023   CO2 26 11/30/2023   BUN 14 11/30/2023   CREATININE 0.75 11/30/2023   GFRNONAA >60 11/30/2023   CALCIUM 9.2 11/30/2023   PHOS 3.0 04/16/2009   PROT 6.6 11/30/2023   ALBUMIN  3.8 11/30/2023   BILITOT 0.3 11/30/2023   ALKPHOS 50 11/30/2023   AST 12 (L) 11/30/2023   ALT 11 11/30/2023   ANIONGAP 7 11/30/2023      The ASCVD Risk score (Arnett DK, et al., 2019) failed to calculate for the following reasons:   Risk score cannot be calculated because patient has a medical history suggesting prior/existing ASCVD    Assessment & Plan:   Problem List Items Addressed This Visit    None Visit Diagnoses       Follow-up exam    -  Primary     History of breast cancer         History of breast reconstruction          Postoperative course as expected. Has 2 cycles of chemotherapy left.  Recommended to follow-up with Dr. Vernetta for port removal after. We will hold off on further expansions until end of chemotherapy.  We will see each other in 3 months to discuss reconstructive options and schedule surgery.   Rannie Craney M. Jaline Pincock, MD [facility]

## 2023-12-17 ENCOUNTER — Other Ambulatory Visit: Payer: Self-pay

## 2023-12-20 NOTE — Progress Notes (Unsigned)
 [facility]   [phone] [phone]    Patient Care Team: Verena Mems, MD as PCP - General (Family Medicine) Lonni Slain, MD as PCP - Cardiology (Cardiology) Tyree Nanetta SAILOR, RN as Oncology Nurse Navigator Gerome, Devere HERO, RN as Registered Nurse Odean Potts, MD as Consulting Physician (Hematology and Oncology) Vernetta Berg, MD as Consulting Physician (General Surgery)   CHIEF COMPLAINT: Follow-up left breast cancer  Oncology History  Malignant neoplasm of upper-inner quadrant of left breast in female, estrogen receptor negative (HCC)  08/19/2023 Initial Diagnosis   Mammogram and ultrasound detected left breast mass 0.6 cm, biopsy: Grade 2 IDC ER 0%, PR 0%, Ki67 40%, HER2 1+ negative    10/01/2023 Surgery   Bilateral mastectomies Left mastectomy: Grade 3 IDC 0.7 cm, margins negative, 0/3 lymph nodes negative, ER 0%, PR 0%, HER2 -1+, Ki-67 40% Right mastectomy: Benign   10/11/2023 Cancer Staging   Staging form: Breast, AJCC 8th Edition - Clinical stage from 10/11/2023: Stage IB (cT1b, cN0, cM0, G3, ER-, PR-, HER2-) - Signed by Odean Potts, MD on 10/11/2023 Histologic grading system: 3 grade system   11/09/2023 -  Chemotherapy   Patient is on Treatment Plan : BREAST TC q21d        CURRENT THERAPY: Adjuvant chemo, TC every 3 weeks x 4 starting 11/10/2023  INTERVAL HISTORY Ms. Manring returns for follow-up, she completed cycle 2 TC.  ROS   Past Medical History:  Diagnosis Date   Arthritis    Celiac disease    Fibromyalgia    Gallstones    GERD (gastroesophageal reflux disease)    Migraine headache    Dr Oneita   Nasal polyp    Nerve pain    PFO (patent foramen ovale) 04/2009   by bubble study/TEE, Dr Wonda, cardiology   PONV (postoperative nausea and vomiting)    Seasonal allergies    Stroke (HCC) 04/2009, 07/2009   Dr Rosemarie right arm weakness     Past Surgical History:  Procedure Laterality Date    BREAST BIOPSY Left 08/19/2023   US  LT BREAST BX W LOC DEV 1ST LESION IMG BX SPEC US  GUIDE 08/19/2023 [facility] MAMMOGRAPHY   CHOLECYSTECTOMY N/A 05/30/2019   Procedure: LAPAROSCOPIC CHOLECYSTECTOMY;  Surgeon: Tanda Locus, MD;  Location: [facility];  Service: General;  Laterality: N/A;   HARDWARE REMOVAL Right 06/15/2023   Procedure: REMOVAL, HARDWARE;  Surgeon: Sheril Coy, MD;  Location: [facility];  Service: Orthopedics;  Laterality: Right;   KNEE SURGERY Right    x2   PATENT FORAMEN OVALE(PFO) CLOSURE  09/2009   transcath closure    PORTACATH PLACEMENT N/A 10/01/2023   Procedure: INSERTION, TUNNELED CENTRAL VENOUS DEVICE, WITH PORT;  Surgeon: Vernetta Berg, MD;  Location: [facility];  Service: General;  Laterality: N/A;  PORT-A-CATH INSERTION WITH ULTRASOUND GUIDANCE   SENTINEL NODE BIOPSY Left 10/01/2023   Procedure: BIOPSY, LYMPH NODE, SENTINEL;  Surgeon: Vernetta Berg, MD;  Location: [facility];  Service: General;  Laterality: Left;   TISSUE EXPANDER PLACEMENT Bilateral 10/01/2023   Procedure: INSERTION, TISSUE EXPANDER;  Surgeon: Montorfano, Lisandro M, MD;  Location:  SURGERY CENTER;  Service: Plastics;  Laterality: Bilateral;  WITH ACELLULAR DERMAL MATRIX   TOTAL KNEE ARTHROPLASTY Right 06/15/2023   Procedure: ARTHROPLASTY, KNEE, TOTAL;  Surgeon: Sheril Coy, MD;  Location: [facility];  Service: Orthopedics;  Laterality: Right;   TOTAL MASTECTOMY Bilateral 10/01/2023   Procedure: MASTECTOMY, SIMPLE;  Surgeon: Vernetta Berg, MD;  Location: [facility];  Service: General;  Laterality: Bilateral;   WISDOM TOOTH EXTRACTION       Outpatient Encounter Medications as of 12/21/2023  Medication Sig   acetaminophen  (TYLENOL ) 500 MG tablet Take 1 tablet (500 mg total) by mouth every 6 (six) hours. (Patient taking differently: Take 500 mg by mouth every 6 (six) hours as needed.)   albuterol  (VENTOLIN  HFA) 108 (90 Base) MCG/ACT inhaler Inhale  1-2 puffs into the lungs every 6 (six) hours as needed for wheezing or shortness of breath.   cetirizine  (ZYRTEC ) 10 MG tablet Take 10 mg by mouth daily.   clindamycin-benzoyl peroxide (BENZACLIN) gel Apply topically 2 (two) times daily.   Cyanocobalamin  (VITAMIN B-12) 5000 MCG TBDP Take 5,000 mcg by mouth daily.   dexamethasone  (DECADRON ) 4 MG tablet Take 1 tablet day before chemo and 1 tablet day after chemo with food   diphenoxylate-atropine (LOMOTIL) 2.5-0.025 MG tablet Take 1 tablet by mouth 4 (four) times daily as needed for diarrhea or loose stools.   DULoxetine  (CYMBALTA ) 30 MG capsule Take 30-60 mg by mouth See admin instructions. Take 30 mg in the morning and 60 mg at bedtime   EMGALITY 120 MG/ML SOAJ Inject 120 mg into the skin every 30 (thirty) days.   Ferrous Sulfate (IRON PO) Take 1 tablet by mouth daily. Gummy   fluticasone  (FLONASE ) 50 MCG/ACT nasal spray Place 2 sprays into both nostrils daily for 7 days.   gabapentin  (NEURONTIN ) 300 MG capsule Take 1 capsule (300 mg total) by mouth 2 (two) times daily.   ibuprofen  (ADVIL ) 600 MG tablet Take 1 tablet (600 mg total) by mouth every 8 (eight) hours as needed.   lidocaine -prilocaine  (EMLA ) cream Apply to affected area once   magic mouthwash (nystatin, lidocaine , diphenhydrAMINE, alum & mag hydroxide) suspension Swish and spit out 5 mLs by mouth 3 (three) times daily as needed for mouth pain.   methocarbamol  (ROBAXIN ) 750 MG tablet Take 750 mg by mouth every 6 (six) hours as needed.   Multiple Vitamins-Minerals (MULTIVITAMIN WITH MINERALS) tablet Take 1 tablet by mouth daily. Gummy   omeprazole (PRILOSEC) 20 MG capsule Take 1 capsule (20 mg total) by mouth 2 (two) times daily before a meal.   ondansetron  (ZOFRAN ) 8 MG tablet Take 1 tablet (8 mg total) by mouth every 8 (eight) hours as needed for nausea or vomiting. Start on the third day after chemotherapy.   Probiotic Product (PROBIOTIC PO) Take 1 tablet by mouth daily. 30 billion    prochlorperazine  (COMPAZINE ) 10 MG tablet Take 1 tablet (10 mg total) by mouth every 6 (six) hours as needed for nausea or vomiting.   zolpidem (AMBIEN) 5 MG tablet Take 1 tablet (5 mg total) by mouth at bedtime as needed for sleep.   No facility-administered encounter medications on file as of 12/21/2023.     There were no vitals filed for this visit. There is no height or weight on file to calculate BMI.   ECOG PERFORMANCE STATUS: {CHL ONC ECOG [phone]}  PHYSICAL EXAM GENERAL:alert, no distress and comfortable SKIN: no rash  EYES: sclera clear NECK: without mass LYMPH:  no palpable cervical or supraclavicular lymphadenopathy  LUNGS: clear with normal breathing effort HEART: regular rate & rhythm, no lower extremity edema ABDOMEN: abdomen soft, non-tender and normal bowel sounds NEURO: alert & oriented x 3 with fluent speech, no focal motor/sensory deficits Breast exam:  PAC without erythema    CBC    Latest Ref Rng & Units  11/30/2023    9:06 AM 11/28/2023    8:44 PM 11/08/2023    7:54 AM  CBC  WBC 4.0 - 10.5 K/uL 10.9  6.9  5.9   Hemoglobin 12.0 - 15.0 g/dL 88.6  88.2  87.6   Hematocrit 36.0 - 46.0 % 33.3  37.1  36.6   Platelets 150 - 400 K/uL 499  486  296       CMP     Latest Ref Rng & Units 11/30/2023    9:06 AM 11/28/2023    8:44 PM 11/08/2023    7:54 AM  CMP  Glucose 70 - 99 mg/dL 885  92  87   BUN 6 - 20 mg/dL 14  13  10    Creatinine 0.44 - 1.00 mg/dL 9.24  9.20  9.19   Sodium 135 - 145 mmol/L 138  139  137   Potassium 3.5 - 5.1 mmol/L 3.7  3.9  4.2   Chloride 98 - 111 mmol/L 105  103  104   CO2 22 - 32 mmol/L 26  27  28    Calcium 8.9 - 10.3 mg/dL 9.2  9.5  9.0   Total Protein 6.5 - 8.1 g/dL 6.6  6.6  6.4   Total Bilirubin 0.0 - 1.2 mg/dL 0.3  0.2  0.3   Alkaline Phos 38 - 126 U/L 50  58  47   AST 15 - 41 U/L 12  17  14    ALT 0 - 44 U/L 11  12  11        ASSESSMENT & PLAN:  PLAN:  No orders of the defined types were placed in this  encounter.     All questions were answered. The patient knows to call the clinic with any problems, questions or concerns. No barriers to learning were detected. I spent *** counseling the patient face to face. The total time spent in the appointment was *** and more than 50% was on counseling, review of test results, and coordination of care.   Yunus Stoklosa K Audric Venn, NP 12/20/2023 1:13 PM

## 2023-12-21 ENCOUNTER — Inpatient Hospital Stay

## 2023-12-21 ENCOUNTER — Encounter: Payer: Self-pay | Admitting: Nurse Practitioner

## 2023-12-21 ENCOUNTER — Inpatient Hospital Stay (HOSPITAL_BASED_OUTPATIENT_CLINIC_OR_DEPARTMENT_OTHER): Admitting: Nurse Practitioner

## 2023-12-21 ENCOUNTER — Inpatient Hospital Stay: Attending: Hematology and Oncology

## 2023-12-21 VITALS — BP 102/78 | HR 95 | Temp 96.4°F | Resp 17 | Wt 174.6 lb

## 2023-12-21 DIAGNOSIS — C50212 Malignant neoplasm of upper-inner quadrant of left female breast: Secondary | ICD-10-CM

## 2023-12-21 DIAGNOSIS — Z171 Estrogen receptor negative status [ER-]: Secondary | ICD-10-CM | POA: Insufficient documentation

## 2023-12-21 DIAGNOSIS — Z8673 Personal history of transient ischemic attack (TIA), and cerebral infarction without residual deficits: Secondary | ICD-10-CM | POA: Insufficient documentation

## 2023-12-21 DIAGNOSIS — Z5189 Encounter for other specified aftercare: Secondary | ICD-10-CM | POA: Diagnosis not present

## 2023-12-21 DIAGNOSIS — Z1732 Human epidermal growth factor receptor 2 negative status: Secondary | ICD-10-CM | POA: Diagnosis not present

## 2023-12-21 DIAGNOSIS — Z79899 Other long term (current) drug therapy: Secondary | ICD-10-CM | POA: Diagnosis not present

## 2023-12-21 DIAGNOSIS — Z5111 Encounter for antineoplastic chemotherapy: Secondary | ICD-10-CM | POA: Insufficient documentation

## 2023-12-21 DIAGNOSIS — N3289 Other specified disorders of bladder: Secondary | ICD-10-CM | POA: Insufficient documentation

## 2023-12-21 DIAGNOSIS — Z1722 Progesterone receptor negative status: Secondary | ICD-10-CM | POA: Diagnosis not present

## 2023-12-21 DIAGNOSIS — Z9013 Acquired absence of bilateral breasts and nipples: Secondary | ICD-10-CM | POA: Diagnosis not present

## 2023-12-21 DIAGNOSIS — M898X9 Other specified disorders of bone, unspecified site: Secondary | ICD-10-CM | POA: Diagnosis not present

## 2023-12-21 LAB — CBC WITH DIFFERENTIAL (CANCER CENTER ONLY)
Abs Immature Granulocytes: 0.04 K/uL (ref 0.00–0.07)
Basophils Absolute: 0 K/uL (ref 0.0–0.1)
Basophils Relative: 0 %
Eosinophils Absolute: 0 K/uL (ref 0.0–0.5)
Eosinophils Relative: 0 %
HCT: 33.5 % — ABNORMAL LOW (ref 36.0–46.0)
Hemoglobin: 11.3 g/dL — ABNORMAL LOW (ref 12.0–15.0)
Immature Granulocytes: 0 %
Lymphocytes Relative: 13 %
Lymphs Abs: 1.5 K/uL (ref 0.7–4.0)
MCH: 31.6 pg (ref 26.0–34.0)
MCHC: 33.7 g/dL (ref 30.0–36.0)
MCV: 93.6 fL (ref 80.0–100.0)
Monocytes Absolute: 0.8 K/uL (ref 0.1–1.0)
Monocytes Relative: 7 %
Neutro Abs: 9.5 K/uL — ABNORMAL HIGH (ref 1.7–7.7)
Neutrophils Relative %: 80 %
Platelet Count: 373 K/uL (ref 150–400)
RBC: 3.58 MIL/uL — ABNORMAL LOW (ref 3.87–5.11)
RDW: 15.9 % — ABNORMAL HIGH (ref 11.5–15.5)
WBC Count: 12 K/uL — ABNORMAL HIGH (ref 4.0–10.5)
nRBC: 0 % (ref 0.0–0.2)

## 2023-12-21 LAB — CMP (CANCER CENTER ONLY)
ALT: 13 U/L (ref 0–44)
AST: 13 U/L — ABNORMAL LOW (ref 15–41)
Albumin: 3.9 g/dL (ref 3.5–5.0)
Alkaline Phosphatase: 55 U/L (ref 38–126)
Anion gap: 8 (ref 5–15)
BUN: 10 mg/dL (ref 6–20)
CO2: 26 mmol/L (ref 22–32)
Calcium: 9.4 mg/dL (ref 8.9–10.3)
Chloride: 105 mmol/L (ref 98–111)
Creatinine: 0.64 mg/dL (ref 0.44–1.00)
GFR, Estimated: >60 mL/min (ref >60)
Glucose, Bld: 113 mg/dL — ABNORMAL HIGH (ref 70–99)
Potassium: 3.5 mmol/L (ref 3.5–5.1)
Sodium: 139 mmol/L (ref 135–145)
Total Bilirubin: 0.3 mg/dL (ref 0.0–1.2)
Total Protein: 6.4 g/dL — ABNORMAL LOW (ref 6.5–8.1)

## 2023-12-21 LAB — PREGNANCY, URINE: Preg Test, Ur: NEGATIVE

## 2023-12-21 MED ORDER — DEXAMETHASONE SOD PHOSPHATE PF 10 MG/ML IJ SOLN
10.0000 mg | Freq: Once | INTRAMUSCULAR | Status: AC
  Administered 2023-12-21: 10 mg via INTRAVENOUS

## 2023-12-21 MED ORDER — PHENAZOPYRIDINE HCL 100 MG PO TABS: 100.0000 mg | ORAL_TABLET | Freq: Three times a day (TID) | ORAL | 1 refills | Status: AC | PRN

## 2023-12-21 MED ORDER — SODIUM CHLORIDE 0.9 % IV SOLN
75.0000 mg/m2 | Freq: Once | INTRAVENOUS | Status: AC
  Administered 2023-12-21: 160 mg via INTRAVENOUS
  Filled 2023-12-21: qty 16

## 2023-12-21 MED ORDER — SODIUM CHLORIDE 0.9 % IV SOLN
600.0000 mg/m2 | Freq: Once | INTRAVENOUS | Status: AC
  Administered 2023-12-21: 1160 mg via INTRAVENOUS
  Filled 2023-12-21: qty 58

## 2023-12-21 MED ORDER — SODIUM CHLORIDE 0.9 % IV SOLN: INTRAVENOUS | Status: DC

## 2023-12-21 MED ORDER — PALONOSETRON HCL INJECTION 0.25 MG/5ML
0.2500 mg | Freq: Once | INTRAVENOUS | Status: AC
  Administered 2023-12-21: 0.25 mg via INTRAVENOUS
  Filled 2023-12-21: qty 5

## 2023-12-21 NOTE — Patient Instructions (Addendum)
 [facility] - A DEPT OF [facility]  Discharge Instructions: Thank you for choosing [facility] to provide your oncology and hematology care.   If you have a lab appointment with the Cancer Center, please go directly to the Cancer Center and check in at the registration area.   Wear comfortable clothing and clothing appropriate for easy access to any Portacath or PICC line.   We strive to give you quality time with your provider. You may need to reschedule your appointment if you arrive late (15 or more minutes).  Arriving late affects you and other patients whose appointments are after yours.  Also, if you miss three or more appointments without notifying the office, you may be dismissed from the clinic at the provider's discretion.      For prescription refill requests, have your pharmacy contact our office and allow 72 hours for refills to be completed.    Today you received the following chemotherapy and/or immunotherapy agents: Taxotere, cytoxan    To help prevent nausea and vomiting after your treatment, we encourage you to take your nausea medication as directed.  BELOW ARE SYMPTOMS THAT SHOULD BE REPORTED IMMEDIATELY: *FEVER GREATER THAN 100.4 F (38 C) OR HIGHER *CHILLS OR SWEATING *NAUSEA AND VOMITING THAT IS NOT CONTROLLED WITH YOUR NAUSEA MEDICATION *UNUSUAL SHORTNESS OF BREATH *UNUSUAL BRUISING OR BLEEDING *URINARY PROBLEMS (pain or burning when urinating, or frequent urination) *BOWEL PROBLEMS (unusual diarrhea, constipation, pain near the anus) TENDERNESS IN MOUTH AND THROAT WITH OR WITHOUT PRESENCE OF ULCERS (sore throat, sores in mouth, or a toothache) UNUSUAL RASH, SWELLING OR PAIN  UNUSUAL VAGINAL DISCHARGE OR ITCHING   Items with * indicate a potential emergency and should be followed up as soon as possible or go to the Emergency Department if any problems should occur.  Please show the CHEMOTHERAPY ALERT CARD or  IMMUNOTHERAPY ALERT CARD at check-in to the Emergency Department and triage nurse.  Should you have questions after your visit or need to cancel or reschedule your appointment, please contact [facility] - A DEPT OF [facility]  Dept: [phone]  and follow the prompts.  Office hours are 8:00 a.m. to 4:30 p.m. Monday - Friday. Please note that voicemails left after 4:00 p.m. may not be returned until the following business day.  We are closed weekends and major holidays. You have access to a nurse at all times for urgent questions. Please call the main number to the clinic Dept: [phone] and follow the prompts.   For any non-urgent questions, you may also contact your provider using MyChart. We now offer e-Visits for anyone 97 and older to request care online for non-urgent symptoms. For details visit [email or web address].   Also download the MyChart app! Go to the app store, search MyChart, open the app, select [facility], and log in with your MyChart username and password.

## 2023-12-23 ENCOUNTER — Inpatient Hospital Stay

## 2023-12-23 VITALS — BP 101/69 | HR 99 | Temp 98.2°F | Resp 14

## 2023-12-23 DIAGNOSIS — Z5111 Encounter for antineoplastic chemotherapy: Secondary | ICD-10-CM | POA: Diagnosis not present

## 2023-12-23 DIAGNOSIS — Z171 Estrogen receptor negative status [ER-]: Secondary | ICD-10-CM

## 2023-12-23 DIAGNOSIS — N3289 Other specified disorders of bladder: Secondary | ICD-10-CM | POA: Diagnosis not present

## 2023-12-23 DIAGNOSIS — Z79899 Other long term (current) drug therapy: Secondary | ICD-10-CM | POA: Diagnosis not present

## 2023-12-23 DIAGNOSIS — Z1732 Human epidermal growth factor receptor 2 negative status: Secondary | ICD-10-CM | POA: Diagnosis not present

## 2023-12-23 DIAGNOSIS — Z8673 Personal history of transient ischemic attack (TIA), and cerebral infarction without residual deficits: Secondary | ICD-10-CM | POA: Diagnosis not present

## 2023-12-23 DIAGNOSIS — C50212 Malignant neoplasm of upper-inner quadrant of left female breast: Secondary | ICD-10-CM | POA: Diagnosis not present

## 2023-12-23 DIAGNOSIS — Z1722 Progesterone receptor negative status: Secondary | ICD-10-CM | POA: Diagnosis not present

## 2023-12-23 DIAGNOSIS — Z5189 Encounter for other specified aftercare: Secondary | ICD-10-CM | POA: Diagnosis not present

## 2023-12-23 DIAGNOSIS — Z9013 Acquired absence of bilateral breasts and nipples: Secondary | ICD-10-CM | POA: Diagnosis not present

## 2023-12-23 DIAGNOSIS — M898X9 Other specified disorders of bone, unspecified site: Secondary | ICD-10-CM | POA: Diagnosis not present

## 2023-12-23 MED ORDER — PEGFILGRASTIM-JMDB 6 MG/0.6ML ~~LOC~~ SOSY
6.0000 mg | PREFILLED_SYRINGE | Freq: Once | SUBCUTANEOUS | Status: AC
  Administered 2023-12-23: 6 mg via SUBCUTANEOUS
  Filled 2023-12-23: qty 0.6

## 2023-12-26 ENCOUNTER — Ambulatory Visit
Admission: EM | Admit: 2023-12-26 | Discharge: 2023-12-26 | Disposition: A | Discharge status: Home / Self Care | Attending: Family Medicine | Admitting: Family Medicine

## 2023-12-26 DIAGNOSIS — H6991 Unspecified Eustachian tube disorder, right ear: Secondary | ICD-10-CM

## 2023-12-26 DIAGNOSIS — B370 Candidal stomatitis: Secondary | ICD-10-CM

## 2023-12-26 MED ORDER — CLOTRIMAZOLE 10 MG MT TROC: 10.0000 mg | Freq: Every day | OROMUCOSAL | 0 refills | Status: DC

## 2023-12-26 MED ORDER — PREDNISONE 20 MG PO TABS: ORAL_TABLET | ORAL | 0 refills | Status: AC

## 2023-12-26 NOTE — ED Provider Notes (Signed)
 [facility] - URGENT CARE CENTER  Note:  This document was prepared using Conservation officer, historic buildings and may include unintentional dictation errors.  MRN: [id number] DOB: Dec 29, 1975  Subjective:   Tonya Andrews is a 48 y.o. female presenting for 2 chief complaints.  Reports 2-day history of thrush in her mouth.  Feels a white plaque over her mouth generally her tongue and mouth feels sore all the time.  Patient is undergoing chemotherapy currently.  Would like to know if she is going to get systemic fungal infection, thrush esophagitis. Reports several month history of persistent intermittent right ear pain.  Has used Flonase , oral antihistamines.  No fever, drainage.  Has a history of persistent allergic rhinitis.  No current facility-administered medications for this encounter.  Current Outpatient Medications:    acetaminophen  (TYLENOL ) 500 MG tablet, Take 1 tablet (500 mg total) by mouth every 6 (six) hours. (Patient taking differently: Take 500 mg by mouth every 6 (six) hours as needed.), Disp: 30 tablet, Rfl: 0   albuterol  (VENTOLIN  HFA) 108 (90 Base) MCG/ACT inhaler, Inhale 1-2 puffs into the lungs every 6 (six) hours as needed for wheezing or shortness of breath., Disp: 8.5 g, Rfl: 0   cetirizine  (ZYRTEC ) 10 MG tablet, Take 10 mg by mouth daily., Disp: , Rfl:    clindamycin -benzoyl peroxide (BENZACLIN) gel, Apply topically 2 (two) times daily., Disp: 25 g, Rfl: 0   Cyanocobalamin  (VITAMIN B-12) 5000 MCG TBDP, Take 5,000 mcg by mouth daily., Disp: , Rfl:    dexamethasone  (DECADRON ) 4 MG tablet, Take 1 tablet day before chemo and 1 tablet day after chemo with food, Disp: 8 tablet, Rfl: 0   diphenoxylate -atropine  (LOMOTIL ) 2.5-0.025 MG tablet, Take 1 tablet by mouth 4 (four) times daily as needed for diarrhea or loose stools., Disp: 30 tablet, Rfl: 0   DULoxetine  (CYMBALTA ) 30 MG capsule, Take 30-60 mg by mouth See admin instructions. Take 30 mg in the morning and 60 mg at  bedtime, Disp: , Rfl:    EMGALITY 120 MG/ML SOAJ, Inject 120 mg into the skin every 30 (thirty) days., Disp: , Rfl:    Ferrous Sulfate (IRON PO), Take 1 tablet by mouth daily. Gummy, Disp: , Rfl:    fluticasone  (FLONASE ) 50 MCG/ACT nasal spray, Place 2 sprays into both nostrils daily for 7 days., Disp: 16 g, Rfl: 2   gabapentin  (NEURONTIN ) 300 MG capsule, Take 1 capsule (300 mg total) by mouth 2 (two) times daily., Disp: 14 capsule, Rfl: 6   ibuprofen  (ADVIL ) 600 MG tablet, Take 1 tablet (600 mg total) by mouth every 8 (eight) hours as needed., Disp: 30 tablet, Rfl: 0   lidocaine -prilocaine  (EMLA ) cream, Apply to affected area once, Disp: 30 g, Rfl: 3   magic mouthwash (nystatin , lidocaine , diphenhydrAMINE, alum & mag hydroxide) suspension, Swish and spit out 5 mLs by mouth 3 (three) times daily as needed for mouth pain., Disp: 240 mL, Rfl: 1   methocarbamol  (ROBAXIN ) 750 MG tablet, Take 750 mg by mouth every 6 (six) hours as needed., Disp: , Rfl:    Multiple Vitamins-Minerals (MULTIVITAMIN WITH MINERALS) tablet, Take 1 tablet by mouth daily. Gummy, Disp: , Rfl:    omeprazole  (PRILOSEC) 20 MG capsule, Take 1 capsule (20 mg total) by mouth 2 (two) times daily before a meal., Disp: 60 capsule, Rfl: 3   ondansetron  (ZOFRAN ) 8 MG tablet, Take 1 tablet (8 mg total) by mouth every 8 (eight) hours as needed for nausea or vomiting. Start on the third  day after chemotherapy., Disp: 30 tablet, Rfl: 1   phenazopyridine  (PYRIDIUM ) 100 MG tablet, Take 1 tablet (100 mg total) by mouth 3 (three) times daily as needed for pain (for up to 3 days)., Disp: 9 tablet, Rfl: 1   Probiotic Product (PROBIOTIC PO), Take 1 tablet by mouth daily. 30 billion, Disp: , Rfl:    prochlorperazine  (COMPAZINE ) 10 MG tablet, Take 1 tablet (10 mg total) by mouth every 6 (six) hours as needed for nausea or vomiting., Disp: 30 tablet, Rfl: 1   zolpidem  (AMBIEN ) 5 MG tablet, Take 1 tablet (5 mg total) by mouth at bedtime as needed for  sleep., Disp: 30 tablet, Rfl: 0   Allergies  Allergen Reactions   Amoxicillin Nausea And Vomiting    Does not ever want again. Made her feel bad.     Nifedipine Other (See Comments)   Rho D Immune Globulin Other (See Comments)   Latex Other (See Comments)    Unknown to patient, happened during surgery    Past Medical History:  Diagnosis Date   Arthritis    Celiac disease    Fibromyalgia    Gallstones    GERD (gastroesophageal reflux disease)    Migraine headache    Dr Oneita   Nasal polyp    Nerve pain    PFO (patent foramen ovale) 04/2009   by bubble study/TEE, Dr Wonda, cardiology   PONV (postoperative nausea and vomiting)    Seasonal allergies    Stroke (HCC) 04/2009, 07/2009   Dr Rosemarie right arm weakness     Past Surgical History:  Procedure Laterality Date   BREAST BIOPSY Left 08/19/2023   US  LT BREAST BX W LOC DEV 1ST LESION IMG BX SPEC US  GUIDE 08/19/2023 [facility] MAMMOGRAPHY   CHOLECYSTECTOMY N/A 05/30/2019   Procedure: LAPAROSCOPIC CHOLECYSTECTOMY;  Surgeon: Tanda Locus, MD;  [facility];  Service: General;  Laterality: N/A;   HARDWARE REMOVAL Right 06/15/2023   Procedure: REMOVAL, HARDWARE;  Surgeon: Sheril Coy, MD;  [facility];  Service: Orthopedics;  Laterality: Right;   KNEE SURGERY Right    x2   PATENT FORAMEN OVALE(PFO) CLOSURE  09/2009   transcath closure    PORTACATH PLACEMENT N/A 10/01/2023   Procedure: INSERTION, TUNNELED CENTRAL VENOUS DEVICE, WITH PORT;  Surgeon: Vernetta Berg, MD;  Location: [facility];  Service: General;  Laterality: N/A;  PORT-A-CATH INSERTION WITH ULTRASOUND GUIDANCE   SENTINEL NODE BIOPSY Left 10/01/2023   Procedure: BIOPSY, LYMPH NODE, SENTINEL;  Surgeon: Vernetta Berg, MD;  Location: [facility];  Service: General;  Laterality: Left;   TISSUE EXPANDER PLACEMENT Bilateral 10/01/2023   Procedure: INSERTION, TISSUE EXPANDER;  Surgeon: Montorfano, Lisandro M, MD;  Location: [facility];  Service: Plastics;  Laterality: Bilateral;  WITH ACELLULAR DERMAL MATRIX   TOTAL KNEE ARTHROPLASTY Right 06/15/2023   Procedure: ARTHROPLASTY, KNEE, TOTAL;  Surgeon: Sheril Coy, MD;  [facility];  Service: Orthopedics;  Laterality: Right;   TOTAL MASTECTOMY Bilateral 10/01/2023   Procedure: MASTECTOMY, SIMPLE;  Surgeon: Vernetta Berg, MD;  Location: [facility];  Service: General;  Laterality: Bilateral;   WISDOM TOOTH EXTRACTION      Family History  Problem Relation Age of Onset   Hypertension Mother    Arthritis/Rheumatoid Father    Prostate cancer Father 62   Lung cancer Maternal Uncle 50       smoked   Prostate cancer Maternal Grandfather 40 - 45  metastatic   Eczema Daughter    Asthma Daughter    Eczema Daughter    Asthma Daughter    Allergic rhinitis Daughter    Eczema Son    Asthma Son    Allergic rhinitis Son     Social History   Tobacco Use   Smoking status: Never    Passive exposure: Never   Smokeless tobacco: Never  Vaping Use   Vaping status: Never Used  Substance Use Topics   Alcohol use: Never   Drug use: No    ROS   Objective:   Vitals: BP 106/75 (BP Location: Left Arm)   Pulse (!) 113   Temp 98.2 F (36.8 C) (Oral)   Resp 16   LMP  (Within Months) Comment: 1 month  SpO2 96%   Physical Exam Constitutional:      General: She is not in acute distress.    Appearance: Normal appearance. She is well-developed. She is not ill-appearing, toxic-appearing or diaphoretic.  HENT:     Head: Normocephalic and atraumatic.     Right Ear: Tympanic membrane, ear canal and external ear normal. No tenderness. There is no impacted cerumen. Tympanic membrane is not injected, perforated, erythematous or bulging.     Left Ear: Tympanic membrane, ear canal and external ear normal. No tenderness. There is no impacted cerumen. Tympanic membrane is not injected, perforated, erythematous or bulging.     Nose: Nose  normal.     Mouth/Throat:     Mouth: Mucous membranes are moist.     Pharynx: No pharyngeal swelling, oropharyngeal exudate, posterior oropharyngeal erythema or uvula swelling.     Tonsils: No tonsillar exudate or tonsillar abscesses. 0 on the right. 0 on the left.     Comments: Thick white plaque overlying tongue. Eyes:     General: No scleral icterus.       Right eye: No discharge.        Left eye: No discharge.     Extraocular Movements: Extraocular movements intact.  Cardiovascular:     Rate and Rhythm: Normal rate.  Pulmonary:     Effort: Pulmonary effort is normal.  Skin:    General: Skin is warm and dry.  Neurological:     General: No focal deficit present.     Mental Status: She is alert and oriented to person, place, and time.  Psychiatric:        Mood and Affect: Mood normal.        Behavior: Behavior normal.        Thought Content: Thought content normal.        Judgment: Judgment normal.      Assessment and Plan :   PDMP not reviewed this encounter.  1. Oral thrush   2. Eustachian tube dysfunction, right    Recommended managing for oral thrush with clotrimazole  troches.  Patient would benefit from following up with her oncologist regarding her concerns about systemic fungal infection, thrush esophagitis.  Ear exam is completely normal.  As this is a chronic issue, we will uphold antibiotic stewardship.  Offered an oral prednisone  course for eustachian tube dysfunction.  Follow-up with ENT specialist.    Christopher Savannah, PA-C 12/26/23 0922

## 2023-12-26 NOTE — ED Triage Notes (Signed)
 Pt reports chemo thrush in mouth x 2 days. Reports she has a sore in the mouth all the time. Salt rinse gives no relief.

## 2023-12-27 ENCOUNTER — Other Ambulatory Visit: Payer: Self-pay

## 2023-12-27 ENCOUNTER — Encounter: Payer: Self-pay | Admitting: Hematology and Oncology

## 2023-12-27 MED ORDER — FLUCONAZOLE 100 MG PO TABS: 100.0000 mg | ORAL_TABLET | Freq: Every day | ORAL | 0 refills | Status: DC

## 2023-12-27 NOTE — Telephone Encounter (Signed)
 S/w patient regarding recent UC visit for thrush.  Patient noted she had been assessed and prescribed medication for thrush, but wanted to get Dr. Gara feedback.  Dr. Odean aware and would like patient to also take diflucan  100 mg daily for 7 days.  Medication sent in to patient's preferred pharmacy.  Patient knows to call the office back should area not clear up within the next few days. Patient verbalized an understanding of the information.

## 2024-01-03 ENCOUNTER — Telehealth: Payer: Self-pay

## 2024-01-03 MED ORDER — FLUCONAZOLE 100 MG PO TABS: 100.0000 mg | ORAL_TABLET | Freq: Every day | ORAL | 0 refills | Status: DC

## 2024-01-03 NOTE — Telephone Encounter (Signed)
 duplicate

## 2024-01-05 ENCOUNTER — Ambulatory Visit

## 2024-01-05 VITALS — BP 104/72 | HR 100

## 2024-01-05 DIAGNOSIS — Z09 Encounter for follow-up examination after completed treatment for conditions other than malignant neoplasm: Secondary | ICD-10-CM

## 2024-01-05 DIAGNOSIS — Z9889 Other specified postprocedural states: Secondary | ICD-10-CM

## 2024-01-05 DIAGNOSIS — Z853 Personal history of malignant neoplasm of breast: Secondary | ICD-10-CM

## 2024-01-05 NOTE — Progress Notes (Signed)
   Established Patient Office Visit  Subjective   Patient ID: Tonya Andrews, female    DOB: Apr 26, 1975  Age: 48 y.o. MRN: [id number]  Chief Complaint  Patient presents with   Follow-up    HPI Patient is a 48 yo F with history of breast cancer who underwent bilateral mastectomies followed by subpectoral tissue expander reconstruction on 10/01/2023. Incisions C/D/I.  She is undergoing chemotherapy, per patient has had several side effects and is looking forward to finish the therapy.  She believes she will pursue implant based reconstruction after she recovers from chemotherapy.  Patient also complaining about port discomfort, looking forward to remove the port as soon as she finished chemotherapy. Comes accompanied by husband.   Patient presents today due to possible implant rotation on the left.    Objective:     BP 104/72 (BP Location: Right Arm, Patient Position: Sitting, Cuff Size: Normal)   Pulse 100   SpO2 98%  BP Readings from Last 3 Encounters:  01/05/24 104/72  12/26/23 106/75  12/23/23 101/69      Physical Exam MA as chaperone Incisions C/D/I. Skin intact.  No signs of infection. Left implant has flipped upside down.   Last CBC Lab Results  Component Value Date   WBC 12.0 (H) 12/21/2023   HGB 11.3 (L) 12/21/2023   HCT 33.5 (L) 12/21/2023   MCV 93.6 12/21/2023   MCH 31.6 12/21/2023   RDW 15.9 (H) 12/21/2023   PLT 373 12/21/2023   Last metabolic panel Lab Results  Component Value Date   GLUCOSE 113 (H) 12/21/2023   NA 139 12/21/2023   K 3.5 12/21/2023   CL 105 12/21/2023   CO2 26 12/21/2023   BUN 10 12/21/2023   CREATININE 0.64 12/21/2023   GFRNONAA >60 12/21/2023   CALCIUM 9.4 12/21/2023   PHOS 3.0 04/16/2009   PROT 6.4 (L) 12/21/2023   ALBUMIN  3.9 12/21/2023   BILITOT 0.3 12/21/2023   ALKPHOS 55 12/21/2023   AST 13 (L) 12/21/2023   ALT 13 12/21/2023   ANIONGAP 8 12/21/2023      The ASCVD Risk score (Arnett DK, et al., 2019) failed to calculate  for the following reasons:   Risk score cannot be calculated because patient has a medical history suggesting prior/existing ASCVD    Assessment & Plan:   Problem List Items Addressed This Visit   None  Attempted 3 times to flip the tissue expander on the left without success. Reassured patient that expander is still holding the space and told her we will wait until she is done with chemotherapy to plan final reconstruction.  Has 1 cycle of chemotherapy left.  Recommended to follow-up with Dr. Vernetta for port removal after and with me to plan reconstruction/TE reposition.  Samuele Storey M Kelda Azad, MD

## 2024-01-10 ENCOUNTER — Ambulatory Visit: Payer: Self-pay | Attending: Surgery

## 2024-01-10 VITALS — Wt 175.2 lb

## 2024-01-10 DIAGNOSIS — Z483 Aftercare following surgery for neoplasm: Secondary | ICD-10-CM | POA: Insufficient documentation

## 2024-01-10 NOTE — Therapy (Signed)
 OUTPATIENT PHYSICAL THERAPY SOZO SCREENING NOTE   Patient Name: Tonya Andrews MRN: [id number] DOB:Mar 05, 1975, 48 y.o., female Today's Date: 01/10/2024  PCP: Verena Mems, MD REFERRING PROVIDER: Vernetta Berg, MD   PT End of Session - 01/10/24 [phone]     Visit Number 2   # unchanged due to screen only   PT Start Time 0944    PT Stop Time 0949    PT Time Calculation (min) 5 min    Activity Tolerance Patient tolerated treatment well    Behavior During Therapy [facility] for tasks assessed/performed          Past Medical History:  Diagnosis Date   Arthritis    Celiac disease    Fibromyalgia    Gallstones    GERD (gastroesophageal reflux disease)    Migraine headache    Dr Oneita   Nasal polyp    Nerve pain    PFO (patent foramen ovale) 04/2009   by bubble study/TEE, Dr Wonda, cardiology   PONV (postoperative nausea and vomiting)    Seasonal allergies    Stroke (HCC) 04/2009, 07/2009   Dr Rosemarie right arm weakness   Past Surgical History:  Procedure Laterality Date   BREAST BIOPSY Left 08/19/2023   US  LT BREAST BX W LOC DEV 1ST LESION IMG BX SPEC US  GUIDE 08/19/2023 [facility] MAMMOGRAPHY   CHOLECYSTECTOMY N/A 05/30/2019   Procedure: LAPAROSCOPIC CHOLECYSTECTOMY;  Surgeon: Tanda Locus, MD;  [facility];  Service: General;  Laterality: N/A;   HARDWARE REMOVAL Right 06/15/2023   Procedure: REMOVAL, HARDWARE;  Surgeon: Sheril Coy, MD;  [facility];  Service: Orthopedics;  Laterality: Right;   KNEE SURGERY Right    x2   PATENT FORAMEN OVALE(PFO) CLOSURE  09/2009   transcath closure    PORTACATH PLACEMENT N/A 10/01/2023   Procedure: INSERTION, TUNNELED CENTRAL VENOUS DEVICE, WITH PORT;  Surgeon: Vernetta Berg, MD;  Location: [facility];  Service: General;  Laterality: N/A;  PORT-A-CATH INSERTION WITH ULTRASOUND GUIDANCE   SENTINEL NODE BIOPSY Left 10/01/2023   Procedure: BIOPSY, LYMPH NODE, SENTINEL;  Surgeon: Vernetta Berg, MD;  Location: [facility];  Service: General;  Laterality: Left;   TISSUE EXPANDER PLACEMENT Bilateral 10/01/2023   Procedure: INSERTION, TISSUE EXPANDER;  Surgeon: Montorfano, Lisandro M, MD;  Location: [facility];  Service: Plastics;  Laterality: Bilateral;  WITH ACELLULAR DERMAL MATRIX   TOTAL KNEE ARTHROPLASTY Right 06/15/2023   Procedure: ARTHROPLASTY, KNEE, TOTAL;  Surgeon: Sheril Coy, MD;  [facility];  Service: Orthopedics;  Laterality: Right;   TOTAL MASTECTOMY Bilateral 10/01/2023   Procedure: MASTECTOMY, SIMPLE;  Surgeon: Vernetta Berg, MD;  Location: [facility];  Service: General;  Laterality: Bilateral;   WISDOM TOOTH EXTRACTION     Patient Active Problem List   Diagnosis Date Noted   Breast cancer (HCC) 10/01/2023   Hospice care patient 09/17/2023   Genetic testing 09/17/2023   Malignant neoplasm of upper-inner quadrant of left breast in female, estrogen receptor negative (HCC) 09/01/2023   Primary localized osteoarthritis of right knee 06/15/2023   Gastrointestinal complaints 02/25/2022   Dietary counseling and surveillance 02/25/2022   Gastroesophageal reflux disease 02/25/2022   Other allergic rhinitis 02/25/2022   Reactive airway disease 02/25/2022   Excessive daytime sleepiness 09/17/2021   Cholelithiasis without obstruction 08/18/2021   Fibromyalgia 08/18/2021   Paresthesia of arm 08/18/2021   Twitching 08/18/2021   Vitamin B12 deficiency (non anemic) 08/18/2021   Cerebrovascular accident (HCC) 04/18/2020   PFO (  patent foramen ovale) 05/03/2009   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 3 negative nodes removed on the Lt.  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 Rt TKR 06/27/23. Hx of 2 strokes with Rt sided weakness. Hx of hole in the heart. Fibromyalgia.   PRECAUTIONS: left UE Lymphedema risk, None  SUBJECTIVE: Pt returns for her 3 month L-Dex screen.   PAIN:  Are you having pain? No  SOZO SCREENING: Patient was assessed today using the SOZO machine to determine the lymphedema index score. This was compared to her baseline score. It was determined that she is within the recommended range when compared to her baseline and no further action is needed at this time. She will continue SOZO screenings. These are done every 3 months for 2 years post operatively followed by every 6 months for 2 years, and then annually.   L-DEX FLOWSHEETS - 01/10/24 0900       L-DEX LYMPHEDEMA SCREENING   Measurement Type Unilateral    L-DEX MEASUREMENT EXTREMITY Upper Extremity    POSITION  Standing    DOMINANT SIDE Right    At Risk Side Left    BASELINE SCORE (UNILATERAL) 4.1    L-DEX SCORE (UNILATERAL) 7.6    VALUE CHANGE (UNILAT) 3.5            Aden Berwyn Caldron, PTA 01/10/2024, 9:51 AM

## 2024-01-11 ENCOUNTER — Inpatient Hospital Stay: Attending: Hematology and Oncology | Admitting: Hematology and Oncology

## 2024-01-11 ENCOUNTER — Inpatient Hospital Stay: Attending: Hematology and Oncology

## 2024-01-11 ENCOUNTER — Other Ambulatory Visit: Payer: Self-pay | Admitting: Surgery

## 2024-01-11 ENCOUNTER — Other Ambulatory Visit: Payer: Self-pay

## 2024-01-11 ENCOUNTER — Inpatient Hospital Stay

## 2024-01-11 ENCOUNTER — Encounter: Payer: Self-pay | Admitting: *Deleted

## 2024-01-11 VITALS — BP 98/80 | HR 118 | Temp 97.6°F | Resp 18 | Ht 60.0 in | Wt 175.5 lb

## 2024-01-11 VITALS — BP 112/85 | HR 97

## 2024-01-11 DIAGNOSIS — Z171 Estrogen receptor negative status [ER-]: Secondary | ICD-10-CM | POA: Insufficient documentation

## 2024-01-11 DIAGNOSIS — Z1732 Human epidermal growth factor receptor 2 negative status: Secondary | ICD-10-CM | POA: Diagnosis not present

## 2024-01-11 DIAGNOSIS — Z5111 Encounter for antineoplastic chemotherapy: Secondary | ICD-10-CM | POA: Diagnosis present

## 2024-01-11 DIAGNOSIS — Z9013 Acquired absence of bilateral breasts and nipples: Secondary | ICD-10-CM | POA: Diagnosis not present

## 2024-01-11 DIAGNOSIS — C50212 Malignant neoplasm of upper-inner quadrant of left female breast: Secondary | ICD-10-CM

## 2024-01-11 DIAGNOSIS — Z1722 Progesterone receptor negative status: Secondary | ICD-10-CM | POA: Insufficient documentation

## 2024-01-11 DIAGNOSIS — Z5189 Encounter for other specified aftercare: Secondary | ICD-10-CM | POA: Insufficient documentation

## 2024-01-11 LAB — CMP (CANCER CENTER ONLY)
ALT: 16 U/L (ref 0–44)
AST: 19 U/L (ref 15–41)
Albumin: 3.9 g/dL (ref 3.5–5.0)
Alkaline Phosphatase: 66 U/L (ref 38–126)
Anion gap: 10 (ref 5–15)
BUN: 11 mg/dL (ref 6–20)
CO2: 27 mmol/L (ref 22–32)
Calcium: 9.4 mg/dL (ref 8.9–10.3)
Chloride: 103 mmol/L (ref 98–111)
Creatinine: 0.67 mg/dL (ref 0.44–1.00)
GFR, Estimated: >60 mL/min (ref >60)
Glucose, Bld: 109 mg/dL — ABNORMAL HIGH (ref 70–99)
Potassium: 4.1 mmol/L (ref 3.5–5.1)
Sodium: 139 mmol/L (ref 135–145)
Total Bilirubin: 0.2 mg/dL (ref 0.0–1.2)
Total Protein: 6.7 g/dL (ref 6.5–8.1)

## 2024-01-11 LAB — PREGNANCY, URINE: Preg Test, Ur: NEGATIVE

## 2024-01-11 LAB — CBC WITH DIFFERENTIAL (CANCER CENTER ONLY)
Abs Immature Granulocytes: 0.01 K/uL (ref 0.00–0.07)
Basophils Absolute: 0.2 K/uL — ABNORMAL HIGH (ref 0.0–0.1)
Basophils Relative: 3 %
Eosinophils Absolute: 0.1 K/uL (ref 0.0–0.5)
Eosinophils Relative: 2 %
HCT: 35.2 % — ABNORMAL LOW (ref 36.0–46.0)
Hemoglobin: 11.8 g/dL — ABNORMAL LOW (ref 12.0–15.0)
Immature Granulocytes: 0 %
Lymphocytes Relative: 16 %
Lymphs Abs: 1.1 K/uL (ref 0.7–4.0)
MCH: 31.6 pg (ref 26.0–34.0)
MCHC: 33.5 g/dL (ref 30.0–36.0)
MCV: 94.4 fL (ref 80.0–100.0)
Monocytes Absolute: 0.8 K/uL (ref 0.1–1.0)
Monocytes Relative: 12 %
Neutro Abs: 4.4 K/uL (ref 1.7–7.7)
Neutrophils Relative %: 67 %
Platelet Count: 364 K/uL (ref 150–400)
RBC: 3.73 MIL/uL — ABNORMAL LOW (ref 3.87–5.11)
RDW: 15.5 % (ref 11.5–15.5)
WBC Count: 6.5 K/uL (ref 4.0–10.5)
nRBC: 0 % (ref 0.0–0.2)

## 2024-01-11 MED ORDER — DEXAMETHASONE SOD PHOSPHATE PF 10 MG/ML IJ SOLN
10.0000 mg | Freq: Once | INTRAMUSCULAR | Status: AC
  Administered 2024-01-11: 10 mg via INTRAVENOUS

## 2024-01-11 MED ORDER — SODIUM CHLORIDE 0.9 % IV SOLN: INTRAVENOUS | Status: DC

## 2024-01-11 MED ORDER — SODIUM CHLORIDE 0.9 % IV SOLN
140.0000 mg | Freq: Once | INTRAVENOUS | Status: AC
  Administered 2024-01-11: 140 mg via INTRAVENOUS
  Filled 2024-01-11: qty 14

## 2024-01-11 MED ORDER — SODIUM CHLORIDE 0.9 % IV SOLN
600.0000 mg/m2 | Freq: Once | INTRAVENOUS | Status: AC
  Administered 2024-01-11: 1160 mg via INTRAVENOUS
  Filled 2024-01-11: qty 58

## 2024-01-11 MED ORDER — PALONOSETRON HCL INJECTION 0.25 MG/5ML
0.2500 mg | Freq: Once | INTRAVENOUS | Status: AC
  Administered 2024-01-11: 0.25 mg via INTRAVENOUS
  Filled 2024-01-11: qty 5

## 2024-01-11 NOTE — Progress Notes (Signed)
 Ok to proceed without pregnancy test today and reduce Taxotere  dose to 140mg  (~75mg /m2) based on BSA=1.84 per Dr. Odean.  Haroon Shatto, PharmD, MBA

## 2024-01-11 NOTE — Assessment & Plan Note (Signed)
 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  In spite of the diarrhea she is able to maintain her fluid and electrolyte balance extremely well Complete loss of taste Acneform rash on the jaw: Send a prescription for BenzaClin Mouth sores: Sent a prescription for Magic mouthwash Severe fatigue Chemo-induced anemia   Port can be removed. Return to clinic after radiation is complete

## 2024-01-11 NOTE — Progress Notes (Signed)
 Patient Care Team: Verena Mems, MD as PCP - General (Family Medicine) Lonni Slain, MD as PCP - Cardiology (Cardiology) Tyree Nanetta SAILOR, RN as Oncology Nurse Navigator Gerome, Devere HERO, RN as Registered Nurse Odean Potts, MD as Consulting Physician (Hematology and Oncology) Vernetta Berg, MD as Consulting Physician (General Surgery)  DIAGNOSIS:  Encounter Diagnosis  Name Primary?   Malignant neoplasm of upper-inner quadrant of left breast in female, estrogen receptor negative (HCC) Yes    SUMMARY OF ONCOLOGIC HISTORY: Oncology History  Malignant neoplasm of upper-inner quadrant of left breast in female, estrogen receptor negative (HCC)  08/19/2023 Initial Diagnosis   Mammogram and ultrasound detected left breast mass 0.6 cm, biopsy: Grade 2 IDC ER 0%, PR 0%, Ki67 40%, HER2 1+ negative    10/01/2023 Surgery   Bilateral mastectomies Left mastectomy: Grade 3 IDC 0.7 cm, margins negative, 0/3 lymph nodes negative, ER 0%, PR 0%, HER2 -1+, Ki-67 40% Right mastectomy: Benign   10/11/2023 Cancer Staging   Staging form: Breast, AJCC 8th Edition - Clinical stage from 10/11/2023: Stage IB (cT1b, cN0, cM0, G3, ER-, PR-, HER2-) - Signed by Odean Potts, MD on 10/11/2023 Histologic grading system: 3 grade system   11/09/2023 -  Chemotherapy   Patient is on Treatment Plan : BREAST TC q21d       CHIEF COMPLIANT:   HISTORY OF PRESENT ILLNESS:   History of Present Illness Tonya Andrews is a 48 year old female with non-metastatic, estrogen receptor negative left breast cancer, status post mastectomy and adjuvant chemotherapy, who presents for end-of-treatment follow-up and transition to surveillance.  She has completed four cycles of adjuvant chemotherapy after mastectomy for left breast cancer. She asks about timing for port removal and future surveillance. She is scheduled for circulating tumor DNA testing every six months and is not planned for routine imaging given no  visible disease after surgery and adjuvant therapy.  She reports persistent oral symptoms since chemotherapy, including xerostomia, a white tongue coating on waking, dysgeusia, and intermittent oral ulcerations, despite a two-week course of fluconazole  and ongoing oral hygiene measures. She also describes a daily erythematous rash under her eyes with soreness and increased lacrimation.  She has developed cording in her left arm noted in recent physical therapy and requests ongoing therapy for this. She denies current diarrhea and keeps antidiarrheal medication available as needed.  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 Her hemoglobin has remained stable and she feels well from a hematologic standpoint.     ALLERGIES:  is allergic to amoxicillin, nifedipine, rho d immune globulin, and latex.  MEDICATIONS:  Current Outpatient Medications  Medication Sig Dispense Refill   acetaminophen  (TYLENOL ) 500 MG tablet Take 1 tablet (500 mg total) by mouth every 6 (six) hours. 30 tablet 0   albuterol  (VENTOLIN  HFA) 108 (90 Base) MCG/ACT inhaler Inhale 1-2 puffs into the lungs every 6 (six) hours as needed for wheezing or shortness of breath. 8.5 g 0   cetirizine  (ZYRTEC ) 10 MG tablet Take 10 mg by mouth daily.     clindamycin -benzoyl peroxide (BENZACLIN) gel Apply topically 2 (two) times daily. 25 g 0   clotrimazole  (MYCELEX ) 10 MG troche Take 1 tablet (10 mg total) by mouth 5 (five) times daily. 140 Troche 0   Cyanocobalamin  (VITAMIN B-12) 5000 MCG TBDP Take 5,000 mcg by mouth daily.     dexamethasone  (DECADRON ) 4 MG tablet Take 1 tablet day before chemo and  1 tablet day after chemo with food 8 tablet 0   diphenoxylate -atropine  (LOMOTIL ) 2.5-0.025 MG tablet Take 1 tablet by mouth 4 (four) times daily as needed for diarrhea or loose stools. 30 tablet 0   DULoxetine  (CYMBALTA ) 30 MG capsule Take 30-60 mg  by mouth See admin instructions. Take 30 mg in the morning and 60 mg at bedtime     EMGALITY 120 MG/ML SOAJ Inject 120 mg into the skin every 30 (thirty) days.     Ferrous Sulfate (IRON PO) Take 1 tablet by mouth daily. Gummy     fluconazole  (DIFLUCAN ) 100 MG tablet Take 1 tablet (100 mg total) by mouth daily. 7 tablet 0   fluticasone  (FLONASE ) 50 MCG/ACT nasal spray Place 2 sprays into both nostrils daily for 7 days. 16 g 2   gabapentin  (NEURONTIN ) 300 MG capsule Take 1 capsule (300 mg total) by mouth 2 (two) times daily. 14 capsule 6   ibuprofen  (ADVIL ) 600 MG tablet Take 1 tablet (600 mg total) by mouth every 8 (eight) hours as needed. 30 tablet 0   lidocaine -prilocaine  (EMLA ) cream Apply to affected area once 30 g 3   magic mouthwash (nystatin , lidocaine , diphenhydrAMINE, alum & mag hydroxide) suspension Swish and spit out 5 mLs by mouth 3 (three) times daily as needed for mouth pain. 240 mL 1   methocarbamol  (ROBAXIN ) 750 MG tablet Take 750 mg by mouth every 6 (six) hours as needed.     Multiple Vitamins-Minerals (MULTIVITAMIN WITH MINERALS) tablet Take 1 tablet by mouth daily. Gummy     omeprazole  (PRILOSEC) 20 MG capsule Take 1 capsule (20 mg total) by mouth 2 (two) times daily before a meal. 60 capsule 3   ondansetron  (ZOFRAN ) 8 MG tablet Take 1 tablet (8 mg total) by mouth every 8 (eight) hours as needed for nausea or vomiting. Start on the third day after chemotherapy. 30 tablet 1   phenazopyridine  (PYRIDIUM ) 100 MG tablet Take 1 tablet (100 mg total) by mouth 3 (three) times daily as needed for pain (for up to 3 days). 9 tablet 1   predniSONE  (DELTASONE ) 20 MG tablet Take 2 tablets daily with breakfast. 10 tablet 0   Probiotic Product (PROBIOTIC PO) Take 1 tablet by mouth daily. 30 billion     prochlorperazine  (COMPAZINE ) 10 MG tablet Take 1 tablet (10 mg total) by mouth every 6 (six) hours as needed for nausea or vomiting. 30 tablet 1   zolpidem  (AMBIEN ) 5 MG tablet Take 1 tablet (5 mg  total) by mouth at bedtime as needed for sleep. 30 tablet 0   No current facility-administered medications for this visit.    PHYSICAL EXAMINATION: ECOG PERFORMANCE STATUS: 1 - Symptomatic but completely ambulatory  There were no vitals filed for this visit. There were no vitals filed for this visit.  Physical Exam HEENT: No evidence of oral thrush.  (exam performed in the presence of a chaperone)  LABORATORY DATA:  I have reviewed the data as listed    Latest Ref Rng & Units 12/21/2023    8:56 AM 11/30/2023    9:06 AM 11/28/2023    8:44 PM  CMP  Glucose 70 - 99 mg/dL 886  885  92   BUN 6 - 20 mg/dL 10  14  13    Creatinine 0.44 - 1.00 mg/dL 9.35  9.24  9.20   Sodium 135 - 145 mmol/L 139  138  139   Potassium 3.5 - 5.1 mmol/L 3.5  3.7  3.9  Chloride 98 - 111 mmol/L 105  105  103   CO2 22 - 32 mmol/L 26  26  27    Calcium 8.9 - 10.3 mg/dL 9.4  9.2  9.5   Total Protein 6.5 - 8.1 g/dL 6.4  6.6  6.6   Total Bilirubin 0.0 - 1.2 mg/dL 0.3  0.3  0.2   Alkaline Phos 38 - 126 U/L 55  50  58   AST 15 - 41 U/L 13  12  17    ALT 0 - 44 U/L 13  11  12      Lab Results  Component Value Date   WBC 6.5 01/11/2024   HGB 11.8 (L) 01/11/2024   HCT 35.2 (L) 01/11/2024   MCV 94.4 01/11/2024   PLT 364 01/11/2024   NEUTROABS 4.4 01/11/2024    ASSESSMENT & PLAN:  Malignant neoplasm of upper-inner quadrant of left breast in female, estrogen receptor negative (HCC) 10/01/2023:Bilateral mastectomies Left mastectomy: Grade 3 IDC 0.7 cm, margins negative, 0/3 lymph nodes negative, ER 0%, PR 0%, HER2 -1+, Ki-67 40% Right mastectomy: Benign   Treatment plan: Adjuvant chemotherapy with Taxotere  and Cytoxan  every 3 weeks x 4 (starting 10/8 )  -------------------------------------------------------------------------------------------------------------------------------------- Current treatment: Cycle 4 Taxotere  and Cytoxan  (Dignicap) started 11/09/2023   Chemo toxicities: Severe diarrhea: As  needed Lomotil  Complete loss of taste Thrush: Treated with Diflucan . Mouth sores: Improved with Magic mouthwash Severe fatigue Chemo-induced anemia   History of iron deficiency anemia due to celiac disease: Takes oral iron Gummies. Port can be removed.  I sent a message to Dr. Vernetta to remove the port.  Return to clinic in 3 months for survivorship care plan visit At that time we could evaluate her for NRG CC 015 study    No orders of the defined types were placed in this encounter.  The patient has a good understanding of the overall plan. she agrees with it. she will call with any problems that may develop before the next visit here.  I personally spent a total of 30 minutes in the care of the patient today including preparing to see the patient, getting/reviewing separately obtained history, performing a medically appropriate exam/evaluation, counseling and educating, placing orders, referring and communicating with other health care professionals, documenting clinical information in the EHR, independently interpreting results, communicating results, and coordinating care.   Viinay K Marcellius Montagna, MD 01/11/24

## 2024-01-11 NOTE — Progress Notes (Signed)
 Pt stated upon arrival that she no longer wants to use dignicap due to amount of hair loss she has already had. Levon, RN charge informed.

## 2024-01-11 NOTE — Patient Instructions (Signed)
 [facility] - A DEPT OF [facility]  Discharge Instructions: Thank you for choosing [facility] to provide your oncology and hematology care.   If you have a lab appointment with the Cancer Center, please go directly to the Cancer Center and check in at the registration area.   Wear comfortable clothing and clothing appropriate for easy access to any Portacath or PICC line.   We strive to give you quality time with your provider. You may need to reschedule your appointment if you arrive late (15 or more minutes).  Arriving late affects you and other patients whose appointments are after yours.  Also, if you miss three or more appointments without notifying the office, you may be dismissed from the clinic at the provider's discretion.      For prescription refill requests, have your pharmacy contact our office and allow 72 hours for refills to be completed.    Today you received the following chemotherapy and/or immunotherapy agents: Taxotere, cytoxan    To help prevent nausea and vomiting after your treatment, we encourage you to take your nausea medication as directed.  BELOW ARE SYMPTOMS THAT SHOULD BE REPORTED IMMEDIATELY: *FEVER GREATER THAN 100.4 F (38 C) OR HIGHER *CHILLS OR SWEATING *NAUSEA AND VOMITING THAT IS NOT CONTROLLED WITH YOUR NAUSEA MEDICATION *UNUSUAL SHORTNESS OF BREATH *UNUSUAL BRUISING OR BLEEDING *URINARY PROBLEMS (pain or burning when urinating, or frequent urination) *BOWEL PROBLEMS (unusual diarrhea, constipation, pain near the anus) TENDERNESS IN MOUTH AND THROAT WITH OR WITHOUT PRESENCE OF ULCERS (sore throat, sores in mouth, or a toothache) UNUSUAL RASH, SWELLING OR PAIN  UNUSUAL VAGINAL DISCHARGE OR ITCHING   Items with * indicate a potential emergency and should be followed up as soon as possible or go to the Emergency Department if any problems should occur.  Please show the CHEMOTHERAPY ALERT CARD or  IMMUNOTHERAPY ALERT CARD at check-in to the Emergency Department and triage nurse.  Should you have questions after your visit or need to cancel or reschedule your appointment, please contact [facility] - A DEPT OF [facility]  Dept: [phone]  and follow the prompts.  Office hours are 8:00 a.m. to 4:30 p.m. Monday - Friday. Please note that voicemails left after 4:00 p.m. may not be returned until the following business day.  We are closed weekends and major holidays. You have access to a nurse at all times for urgent questions. Please call the main number to the clinic Dept: [phone] and follow the prompts.   For any non-urgent questions, you may also contact your provider using MyChart. We now offer e-Visits for anyone 97 and older to request care online for non-urgent symptoms. For details visit [email or web address].   Also download the MyChart app! Go to the app store, search MyChart, open the app, select [facility], and log in with your MyChart username and password.

## 2024-01-12 ENCOUNTER — Encounter: Payer: Self-pay | Admitting: *Deleted

## 2024-01-12 ENCOUNTER — Encounter (HOSPITAL_BASED_OUTPATIENT_CLINIC_OR_DEPARTMENT_OTHER): Payer: Self-pay | Admitting: Cardiology

## 2024-01-12 ENCOUNTER — Telehealth: Payer: Self-pay | Admitting: Hematology and Oncology

## 2024-01-12 NOTE — Progress Notes (Signed)
 Per MD request RN placed orders for Guardant Reveal via their portal.

## 2024-01-12 NOTE — Telephone Encounter (Signed)
 Left vm for pt about scheduled appt per 12/9 los date and time. Encouraged to call back if need to reschedule

## 2024-01-13 ENCOUNTER — Inpatient Hospital Stay

## 2024-01-13 VITALS — BP 101/77 | HR 104 | Temp 98.5°F | Resp 14

## 2024-01-13 DIAGNOSIS — Z5111 Encounter for antineoplastic chemotherapy: Secondary | ICD-10-CM | POA: Diagnosis not present

## 2024-01-13 DIAGNOSIS — C50212 Malignant neoplasm of upper-inner quadrant of left female breast: Secondary | ICD-10-CM

## 2024-01-13 MED ORDER — PEGFILGRASTIM-JMDB 6 MG/0.6ML ~~LOC~~ SOSY
6.0000 mg | PREFILLED_SYRINGE | Freq: Once | SUBCUTANEOUS | Status: AC
  Administered 2024-01-13: 6 mg via SUBCUTANEOUS
  Filled 2024-01-13: qty 0.6

## 2024-01-14 ENCOUNTER — Other Ambulatory Visit: Payer: Self-pay

## 2024-01-17 ENCOUNTER — Other Ambulatory Visit (HOSPITAL_COMMUNITY): Payer: Self-pay

## 2024-01-17 ENCOUNTER — Other Ambulatory Visit: Payer: Self-pay | Admitting: *Deleted

## 2024-01-17 ENCOUNTER — Telehealth: Payer: Self-pay | Admitting: *Deleted

## 2024-01-17 MED ORDER — NYSTATIN 100000 UNIT/ML MT SUSP
5.0000 mL | Freq: Three times a day (TID) | OROMUCOSAL | 1 refills | Status: DC | PRN
  Filled 2024-01-17: qty 240, 16d supply, fill #0
  Filled 2024-01-29: qty 240, 16d supply, fill #1

## 2024-01-17 MED ORDER — FLUCONAZOLE 100 MG PO TABS: 100.0000 mg | ORAL_TABLET | Freq: Every day | ORAL | 0 refills | Status: DC

## 2024-01-17 NOTE — Telephone Encounter (Signed)
 Called pt to make aware that per Dr. Odean, wants pt to do 2 weeks of oral abt for 7 days and MMW for oral thrush. Left message that rx will be sent to pharmacy. Advised to call for any concerns.

## 2024-01-18 ENCOUNTER — Other Ambulatory Visit (HOSPITAL_COMMUNITY): Payer: Self-pay

## 2024-01-21 ENCOUNTER — Encounter (HOSPITAL_BASED_OUTPATIENT_CLINIC_OR_DEPARTMENT_OTHER): Payer: Self-pay | Admitting: Surgery

## 2024-01-21 NOTE — Progress Notes (Signed)
 Pt's upcoming procedure 02/01/24 and previous cardiac clearance reviewed with Dr Corinne. No additional clearance needed.

## 2024-01-24 ENCOUNTER — Telehealth: Payer: Self-pay | Admitting: Licensed Clinical Social Worker

## 2024-01-24 ENCOUNTER — Telehealth: Payer: Self-pay

## 2024-01-24 ENCOUNTER — Encounter (HOSPITAL_BASED_OUTPATIENT_CLINIC_OR_DEPARTMENT_OTHER): Payer: Self-pay | Admitting: Surgery

## 2024-01-24 ENCOUNTER — Encounter: Payer: Self-pay | Admitting: Hematology and Oncology

## 2024-01-24 ENCOUNTER — Other Ambulatory Visit: Payer: Self-pay | Admitting: *Deleted

## 2024-01-24 ENCOUNTER — Other Ambulatory Visit: Payer: Self-pay

## 2024-01-24 DIAGNOSIS — Z171 Estrogen receptor negative status [ER-]: Secondary | ICD-10-CM

## 2024-01-24 MED ORDER — FLUCONAZOLE 100 MG PO TABS: 100.0000 mg | ORAL_TABLET | Freq: Every day | ORAL | 0 refills | Status: DC

## 2024-01-24 NOTE — Telephone Encounter (Signed)
 [facility] Clinical Social Work  Clinical Social Work was referred by nurse for emotional support.  Clinical Social Worker attempted to contact patient by phone to offer support and assess for needs.   No answer. Left VM with direct contact information.     Shatisha Falter E Morghan Kester, LCSW  Clinical Social Worker [facility]

## 2024-01-24 NOTE — Telephone Encounter (Signed)
 pt called and cancelled apt on 12-24 she stated she flipped expander on her own

## 2024-01-24 NOTE — Progress Notes (Signed)
 Pt called with c/o thrush. Sent previous rx for Diflucan  to preferred pharmacy and consult for SW has been entered. Left vm on pt personal cell with information.

## 2024-01-26 ENCOUNTER — Ambulatory Visit

## 2024-01-28 ENCOUNTER — Other Ambulatory Visit (HOSPITAL_COMMUNITY): Payer: Self-pay

## 2024-01-28 ENCOUNTER — Other Ambulatory Visit: Payer: Self-pay

## 2024-01-28 ENCOUNTER — Telehealth: Payer: Self-pay

## 2024-01-28 DIAGNOSIS — B370 Candidal stomatitis: Secondary | ICD-10-CM

## 2024-01-28 DIAGNOSIS — C50212 Malignant neoplasm of upper-inner quadrant of left female breast: Secondary | ICD-10-CM

## 2024-01-28 MED ORDER — FLUCONAZOLE 100 MG PO TABS
100.0000 mg | ORAL_TABLET | Freq: Every day | ORAL | 0 refills | Status: DC
  Filled 2024-01-28: qty 7, 7d supply, fill #0

## 2024-01-28 MED ORDER — NYSTATIN 100000 UNIT/ML MT SUSP
5.0000 mL | Freq: Three times a day (TID) | OROMUCOSAL | 1 refills | Status: DC | PRN
  Filled 2024-01-28 (×2): qty 240, 16d supply, fill #0

## 2024-01-28 NOTE — Telephone Encounter (Signed)
 Pt called w/ ongoing sx of thrush despite week of Diflucan  tx. RN consulted sx management who prescribed another week of Diflucan  along with adding prednisone  to magic mouthwash. Also advised ENT referral due to pt not being immunocompromised and oral thrush being prolonged and reoccuring. RN placed orders for prescription and referral and notified pt. Pt verbalized understanding.

## 2024-01-29 ENCOUNTER — Encounter: Payer: Self-pay | Admitting: Hematology and Oncology

## 2024-01-29 ENCOUNTER — Other Ambulatory Visit (HOSPITAL_COMMUNITY): Payer: Self-pay

## 2024-01-29 ENCOUNTER — Other Ambulatory Visit: Payer: Self-pay | Admitting: Hematology and Oncology

## 2024-01-29 MED ORDER — PREDNISOLONE SODIUM PHOSPHATE 15 MG/5ML PO SOLN
5.0000 mL | Freq: Three times a day (TID) | OROMUCOSAL | 0 refills | Status: AC | PRN
  Filled 2024-01-29: qty 250, 17d supply, fill #0

## 2024-01-30 ENCOUNTER — Other Ambulatory Visit (HOSPITAL_COMMUNITY): Payer: Self-pay

## 2024-01-31 ENCOUNTER — Other Ambulatory Visit (HOSPITAL_COMMUNITY): Payer: Self-pay

## 2024-01-31 ENCOUNTER — Telehealth: Payer: Self-pay | Admitting: Licensed Clinical Social Worker

## 2024-01-31 ENCOUNTER — Telehealth: Payer: Self-pay

## 2024-01-31 ENCOUNTER — Other Ambulatory Visit: Payer: Self-pay | Admitting: Hematology and Oncology

## 2024-01-31 MED ORDER — FLUCONAZOLE 100 MG PO TABS: 100.0000 mg | ORAL_TABLET | Freq: Every day | ORAL | 1 refills | Status: DC

## 2024-01-31 NOTE — Telephone Encounter (Signed)
 Pt called with concerns of mouth sores. She has been using magic mouthwash but called to ask for diflucan  refill and [facility] not [facility]. Advised pt it was sent to [facility]. She is requesting this to be cancelled and sent to [facility]. Completed per MD.

## 2024-01-31 NOTE — H&P (Signed)
 Tonya Andrews is an 48 y.o. female.   Chief Complaint: port no longer needed HPI: she has completed treatment for breast cancer and is doing well.  Her port-a-cath is no longer needed so removal is requested.  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   Celiac disease    Fibromyalgia    Gallstones    GERD (gastroesophageal reflux disease)    Migraine headache    Dr Oneita   Nasal polyp    Nerve pain    PFO (patent foramen ovale) 04/2009   by bubble study/TEE, Dr Wonda, cardiology   PONV (postoperative nausea and vomiting)    Seasonal allergies    Stroke (HCC) 04/2009, 07/2009   Dr Rosemarie right arm weakness    Past Surgical History:  Procedure Laterality Date   BREAST BIOPSY Left 08/19/2023   US  LT BREAST BX W LOC DEV 1ST LESION IMG BX SPEC US  GUIDE 08/19/2023 [facility] MAMMOGRAPHY   CHOLECYSTECTOMY N/A 05/30/2019   Procedure: LAPAROSCOPIC CHOLECYSTECTOMY;  Surgeon: Tanda Locus, MD;  [facility]: [facility];  Service: General;  Laterality: N/A;   HARDWARE REMOVAL Right 06/15/2023   Procedure: REMOVAL, HARDWARE;  Surgeon: Sheril Coy, MD;  Location: [facility];  Service: Orthopedics;  Laterality: Right;   KNEE SURGERY Right    x2   PATENT FORAMEN OVALE(PFO) CLOSURE  09/2009   transcath closure    PORTACATH PLACEMENT N/A 10/01/2023   Procedure: INSERTION, TUNNELED CENTRAL VENOUS DEVICE, WITH PORT;  Surgeon: Vernetta Berg, MD;  Location: [facility];  Service: General;  Laterality: N/A;  PORT-A-CATH INSERTION WITH ULTRASOUND GUIDANCE   SENTINEL NODE BIOPSY Left 10/01/2023   Procedure: BIOPSY, LYMPH NODE, SENTINEL;  Surgeon: Vernetta Berg, MD;  Location: [facility];  Service: General;  Laterality: Left;   TISSUE EXPANDER PLACEMENT Bilateral 10/01/2023   Procedure: INSERTION, TISSUE EXPANDER;  Surgeon: Montorfano, Lisandro M, MD;  Location: [facility];  Service: Plastics;  Laterality: Bilateral;   WITH ACELLULAR DERMAL MATRIX   TOTAL KNEE ARTHROPLASTY Right 06/15/2023   Procedure: ARTHROPLASTY, KNEE, TOTAL;  Surgeon: Sheril Coy, MD;  Location: [facility];  Service: Orthopedics;  Laterality: Right;   TOTAL MASTECTOMY Bilateral 10/01/2023   Procedure: MASTECTOMY, SIMPLE;  Surgeon: Vernetta Berg, MD;  Location: [facility];  Service: General;  Laterality: Bilateral;   WISDOM TOOTH EXTRACTION      Family History  Problem Relation Age of Onset   Hypertension Mother    Arthritis/Rheumatoid Father    Prostate cancer Father 77   Lung cancer Maternal Uncle 50       smoked   Prostate cancer Maternal Grandfather 82 - 45       metastatic   Eczema Daughter    Asthma Daughter    Eczema Daughter    Asthma Daughter    Allergic rhinitis Daughter    Eczema Son    Asthma Son    Allergic rhinitis Son    Social History:  reports that she has never smoked. She has never been exposed to tobacco smoke. She has never used smokeless tobacco. She reports that she does not drink alcohol and does not use drugs.  Allergies: Allergies[1]  No medications prior to admission.    No results found for this or any previous visit (from the past 48 hours). No results found.  Review of Systems  All other systems reviewed and are negative.   Height 5' 5 (  1.651 m), weight 77.1 kg. Physical Exam Constitutional:      Appearance: Normal appearance.  HENT:     Head: Normocephalic and atraumatic.  Cardiovascular:     Rate and Rhythm: Normal rate and regular rhythm.  Pulmonary:     Effort: Pulmonary effort is normal. No respiratory distress.  Musculoskeletal:     Cervical back: Normal range of motion.  Skin:    General: Skin is warm and dry.  Neurological:     General: No focal deficit present.     Mental Status: She is alert.  Psychiatric:        Mood and Affect: Mood normal.        Behavior: Behavior normal.      Assessment/Plan Port-a-cath no longer needed, therapy  completed  Will proceed to the OR for port-a-cath removal.  Risks were discussed in detail and she agrees to proceed  Vicenta Poli, MD 01/31/2024, 12:34 PM       [1]  Allergies Allergen Reactions   Amoxicillin Nausea And Vomiting    Does not ever want again. Made her feel bad.     Nifedipine Other (See Comments)   Rho D Immune Globulin Other (See Comments)   Latex Other (See Comments)    Unknown to patient, happened during surgery

## 2024-01-31 NOTE — Telephone Encounter (Signed)
 Filled for a 7 day supply on 12/26. Andrea CHRISTELLA Plunk, RN

## 2024-01-31 NOTE — Telephone Encounter (Signed)
 [facility] Clinical Social Work  Clinical Social Work was referred by nurse for emotional support.  Clinical Social Worker attempted to contact patient by phone to offer support and assess for needs.   No answer. Left VM with direct contact information and brief description of support services.  After multiple attempts to contact pt regarding referral to social work, referral is being closed. Pt may be re-referred to support services as needed.      Samnang Shugars E Lis Savitt, LCSW  Clinical Social Worker [facility]

## 2024-02-01 ENCOUNTER — Ambulatory Visit (HOSPITAL_BASED_OUTPATIENT_CLINIC_OR_DEPARTMENT_OTHER)
Admission: RE | Admit: 2024-02-01 | Discharge: 2024-02-01 | Disposition: A | Discharge status: Home / Self Care | Attending: Surgery | Admitting: Surgery

## 2024-02-01 ENCOUNTER — Ambulatory Visit (HOSPITAL_BASED_OUTPATIENT_CLINIC_OR_DEPARTMENT_OTHER): Payer: Self-pay

## 2024-02-01 ENCOUNTER — Other Ambulatory Visit: Payer: Self-pay

## 2024-02-01 ENCOUNTER — Encounter: Payer: Self-pay | Admitting: Hematology and Oncology

## 2024-02-01 ENCOUNTER — Encounter (HOSPITAL_BASED_OUTPATIENT_CLINIC_OR_DEPARTMENT_OTHER)
Admission: RE | Disposition: A | Discharge status: Home / Self Care | Payer: Self-pay | Source: Home / Self Care | Attending: Surgery

## 2024-02-01 ENCOUNTER — Encounter (HOSPITAL_BASED_OUTPATIENT_CLINIC_OR_DEPARTMENT_OTHER): Payer: Self-pay | Admitting: Surgery

## 2024-02-01 DIAGNOSIS — I69351 Hemiplegia and hemiparesis following cerebral infarction affecting right dominant side: Secondary | ICD-10-CM | POA: Diagnosis not present

## 2024-02-01 DIAGNOSIS — K219 Gastro-esophageal reflux disease without esophagitis: Secondary | ICD-10-CM | POA: Insufficient documentation

## 2024-02-01 DIAGNOSIS — M1990 Unspecified osteoarthritis, unspecified site: Secondary | ICD-10-CM | POA: Diagnosis not present

## 2024-02-01 DIAGNOSIS — M797 Fibromyalgia: Secondary | ICD-10-CM | POA: Diagnosis not present

## 2024-02-01 DIAGNOSIS — Z01818 Encounter for other preprocedural examination: Secondary | ICD-10-CM

## 2024-02-01 DIAGNOSIS — Z853 Personal history of malignant neoplasm of breast: Secondary | ICD-10-CM | POA: Insufficient documentation

## 2024-02-01 DIAGNOSIS — Z452 Encounter for adjustment and management of vascular access device: Secondary | ICD-10-CM | POA: Diagnosis present

## 2024-02-01 HISTORY — PX: PORT-A-CATH REMOVAL: SHX5289

## 2024-02-01 LAB — POCT PREGNANCY, URINE: Preg Test, Ur: NEGATIVE

## 2024-02-01 SURGERY — REMOVAL PORT-A-CATH
Anesthesia: Monitor Anesthesia Care | Site: Chest | Laterality: Right

## 2024-02-01 MED ORDER — ACETAMINOPHEN 500 MG PO TABS
1000.0000 mg | ORAL_TABLET | ORAL | Status: AC
  Administered 2024-02-01: 1000 mg via ORAL

## 2024-02-01 MED ORDER — KETOROLAC TROMETHAMINE 30 MG/ML IJ SOLN
INTRAMUSCULAR | Status: DC | PRN
  Administered 2024-02-01: 30 mg via INTRAVENOUS

## 2024-02-01 MED ORDER — LIDOCAINE HCL (CARDIAC) PF 100 MG/5ML IV SOSY
PREFILLED_SYRINGE | INTRAVENOUS | Status: DC | PRN
  Administered 2024-02-01: 20 mg via INTRAVENOUS

## 2024-02-01 MED ORDER — MIDAZOLAM HCL (PF) 2 MG/2ML IJ SOLN
INTRAMUSCULAR | Status: DC | PRN
  Administered 2024-02-01: 2 mg via INTRAVENOUS

## 2024-02-01 MED ORDER — DEXMEDETOMIDINE HCL IN NACL 80 MCG/20ML IV SOLN
INTRAVENOUS | Status: DC | PRN
  Administered 2024-02-01: 4 ug via INTRAVENOUS

## 2024-02-01 MED ORDER — ACETAMINOPHEN 10 MG/ML IV SOLN: 1000.0000 mg | Freq: Once | INTRAVENOUS | Status: DC | PRN

## 2024-02-01 MED ORDER — CHLORHEXIDINE GLUCONATE CLOTH 2 % EX PADS: 6.0000 | MEDICATED_PAD | Freq: Once | CUTANEOUS | Status: DC

## 2024-02-01 MED ORDER — FENTANYL CITRATE (PF) 100 MCG/2ML IJ SOLN
INTRAMUSCULAR | Status: AC
  Filled 2024-02-01: qty 2

## 2024-02-01 MED ORDER — ONDANSETRON HCL 4 MG/2ML IJ SOLN: 4.0000 mg | Freq: Once | INTRAMUSCULAR | Status: DC | PRN

## 2024-02-01 MED ORDER — FENTANYL CITRATE (PF) 100 MCG/2ML IJ SOLN: 25.0000 ug | INTRAMUSCULAR | Status: DC | PRN

## 2024-02-01 MED ORDER — LACTATED RINGERS IV SOLN: INTRAVENOUS | Status: DC

## 2024-02-01 MED ORDER — AMISULPRIDE (ANTIEMETIC) 5 MG/2ML IV SOLN: 10.0000 mg | Freq: Once | INTRAVENOUS | Status: DC | PRN

## 2024-02-01 MED ORDER — OXYCODONE HCL 5 MG/5ML PO SOLN: 5.0000 mg | Freq: Once | ORAL | Status: DC | PRN

## 2024-02-01 MED ORDER — FENTANYL CITRATE (PF) 100 MCG/2ML IJ SOLN
INTRAMUSCULAR | Status: DC | PRN
  Administered 2024-02-01: 25 ug via INTRAVENOUS

## 2024-02-01 MED ORDER — LIDOCAINE-EPINEPHRINE (PF) 1 %-1:200000 IJ SOLN
INTRAMUSCULAR | Status: DC | PRN
  Administered 2024-02-01: 10 mL

## 2024-02-01 MED ORDER — ACETAMINOPHEN 500 MG PO TABS
ORAL_TABLET | ORAL | Status: AC
  Filled 2024-02-01: qty 2

## 2024-02-01 MED ORDER — DEXAMETHASONE SOD PHOSPHATE PF 10 MG/ML IJ SOLN
INTRAMUSCULAR | Status: DC | PRN
  Administered 2024-02-01: 8 mg via INTRAVENOUS

## 2024-02-01 MED ORDER — ONDANSETRON HCL 4 MG/2ML IJ SOLN
INTRAMUSCULAR | Status: DC | PRN
  Administered 2024-02-01: 4 mg via INTRAVENOUS

## 2024-02-01 MED ORDER — TRAMADOL HCL 50 MG PO TABS: 50.0000 mg | ORAL_TABLET | Freq: Four times a day (QID) | ORAL | 0 refills | Status: AC | PRN

## 2024-02-01 MED ORDER — CIPROFLOXACIN IN D5W 400 MG/200ML IV SOLN
INTRAVENOUS | Status: AC
  Filled 2024-02-01: qty 200

## 2024-02-01 MED ORDER — OXYCODONE HCL 5 MG PO TABS: 5.0000 mg | ORAL_TABLET | Freq: Once | ORAL | Status: DC | PRN

## 2024-02-01 MED ORDER — MIDAZOLAM HCL 2 MG/2ML IJ SOLN
INTRAMUSCULAR | Status: AC
  Filled 2024-02-01: qty 2

## 2024-02-01 MED ORDER — PROPOFOL 500 MG/50ML IV EMUL
INTRAVENOUS | Status: DC | PRN
  Administered 2024-02-01: 150 ug/kg/min via INTRAVENOUS

## 2024-02-01 MED ORDER — CIPROFLOXACIN IN D5W 400 MG/200ML IV SOLN
400.0000 mg | INTRAVENOUS | Status: AC
  Administered 2024-02-01: 400 mg via INTRAVENOUS

## 2024-02-01 SURGICAL SUPPLY — 22 items
BLADE SURG 15 STRL LF DISP TIS (BLADE) ×1 IMPLANT
CHLORAPREP W/TINT 26 (MISCELLANEOUS) ×1 IMPLANT
COVER BACK TABLE 60X90IN (DRAPES) ×1 IMPLANT
COVER MAYO STAND STRL (DRAPES) ×1 IMPLANT
DERMABOND ADVANCED .7 DNX12 (GAUZE/BANDAGES/DRESSINGS) ×1 IMPLANT
DRAPE LAPAROTOMY 100X72 PEDS (DRAPES) ×1 IMPLANT
DRAPE UTILITY XL STRL (DRAPES) ×1 IMPLANT
ELECTRODE REM PT RTRN 9FT ADLT (ELECTROSURGICAL) ×1 IMPLANT
GLOVE SURG SIGNA 7.5 PF LTX (GLOVE) ×1 IMPLANT
GOWN STRL REUS W/ TWL LRG LVL3 (GOWN DISPOSABLE) ×1 IMPLANT
GOWN STRL REUS W/ TWL XL LVL3 (GOWN DISPOSABLE) ×1 IMPLANT
NEEDLE HYPO 25X1 1.5 SAFETY (NEEDLE) ×1 IMPLANT
PACK BASIN DAY SURGERY FS (CUSTOM PROCEDURE TRAY) ×1 IMPLANT
PENCIL SMOKE EVACUATOR (MISCELLANEOUS) ×1 IMPLANT
SLEEVE SCD COMPRESS KNEE MED (STOCKING) IMPLANT
SOLN 0.9% NACL POUR BTL 1000ML (IV SOLUTION) IMPLANT
SPIKE FLUID TRANSFER (MISCELLANEOUS) IMPLANT
SUT MNCRL AB 4-0 PS2 18 (SUTURE) ×1 IMPLANT
SUT VIC AB 3-0 SH 27X BRD (SUTURE) ×1 IMPLANT
SYR BULB EAR ULCER 3OZ GRN STR (SYRINGE) IMPLANT
SYR CONTROL 10ML LL (SYRINGE) ×1 IMPLANT
TOWEL GREEN STERILE FF (TOWEL DISPOSABLE) ×1 IMPLANT

## 2024-02-01 NOTE — Anesthesia Postprocedure Evaluation (Signed)
"   Anesthesia Post Note  Patient: Tonya Andrews  Procedure(s) Performed: REMOVAL PORT-A-CATH (Right: Chest)     Patient location during evaluation: Phase II Anesthesia Type: MAC Level of consciousness: awake and alert Pain management: pain level controlled Vital Signs Assessment: post-procedure vital signs reviewed and stable Respiratory status: spontaneous breathing Cardiovascular status: blood pressure returned to baseline Postop Assessment: no apparent nausea or vomiting and adequate PO intake Anesthetic complications: no   No notable events documented.  Last Vitals:  Vitals:   02/01/24 0915 02/01/24 0926  BP: 95/66 (!) 91/58  Pulse: 93 93  Resp: 16 16  Temp:    SpO2: 94% 95%    Last Pain:  Vitals:   02/01/24 0926  TempSrc:   PainSc: 0-No pain                 Beula Joyner T Colhoun      "

## 2024-02-01 NOTE — Interval H&P Note (Signed)
 History and Physical Interval Note:no change in H and P  02/01/2024 8:23 AM  Tonya Andrews  has presented today for surgery, with the diagnosis of HISTORY OF BREAST CANCER  PORT NO LONGER NEEDED.  The various methods of treatment have been discussed with the patient and family. After consideration of risks, benefits and other options for treatment, the patient has consented to  Procedures: REMOVAL PORT-A-CATH (N/A) as a surgical intervention.  The patient's history has been reviewed, patient examined, no change in status, stable for surgery.  I have reviewed the patient's chart and labs.  Questions were answered to the patient's satisfaction.     Vicenta Poli

## 2024-02-01 NOTE — Transfer of Care (Signed)
 Immediate Anesthesia Transfer of Care Note  Patient: Warren SAUNDERS Matheny  Procedure(s) Performed: REMOVAL PORT-A-CATH (Right: Chest)  Patient Location: PACU  Anesthesia Type:MAC  Level of Consciousness: drowsy and patient cooperative  Airway & Oxygen Therapy: Patient Spontanous Breathing and Patient connected to face mask oxygen  Post-op Assessment: Report given to RN and Post -op Vital signs reviewed and stable  Post vital signs: Reviewed and stable  Last Vitals:  Vitals Value Taken Time  BP    Temp    Pulse 100 02/01/24 09:03  Resp 23 02/01/24 09:03  SpO2 99 % 02/01/24 09:03  Vitals shown include unfiled device data.  Last Pain:  Vitals:   02/01/24 0708  TempSrc: Temporal  PainSc: 0-No pain      Patients Stated Pain Goal: 3 (02/01/24 0708)  Complications: No notable events documented.

## 2024-02-01 NOTE — Op Note (Signed)
 REMOVAL PORT-A-CATH  Procedure Note  Tonya Andrews 02/01/2024   Pre-op Diagnosis: HISTORY OF BREAST CANCER  PORT NO LONGER NEEDED     Post-op Diagnosis: SAME  Procedures: REMOVAL PORT-A-CATH  Surgeon(s): Vernetta Berg, MD  Anesthesia: Monitor Anesthesia Care  Staff:  Circulator: Lenon Sonny BROCKS, RN; Eliberto Geroge HERO, RN Scrub Person: Florene Dagoberto MATSU, RN  Estimated Blood Loss: Minimal               Procedure: The patient was brought to the operating room and identified the correct patient.  She is placed upon the operating table and anesthesia was induced.  This was a right internal jugular Port-A-Cath.  Her right chest and neck were prepped and draped in the usual sterile fashion.  I anesthetized the skin over the port site with 1% lidocaine .  I then excised the previous scar with a scalpel.  I then dissected down into the subcutaneous tissue with the cautery.  The port was easily identified.  I remove the 1 suture holding the port in place.  I was then able to easily remove the port and entire catheter from the pocket and central venous system.  I closed the catheter tract site with a figure-of-eight 3-0 Vicryl suture.  Hemostasis achieved with the cautery.  I then closed the subcutaneous tissue with interrupted 3-0 Vicryl sutures and closed the skin with a running 4-0 Monocryl.  Dermabond was then applied.  The patient tolerated the procedure well.  All the counts were correct at the end of the procedure.  There was no evidence of developing hematoma at the neck.  The patient was then taken in a stable condition from the operating room to the recovery room.          Berg Vernetta   Date: 02/01/2024  Time: 8:58 AM

## 2024-02-01 NOTE — Anesthesia Preprocedure Evaluation (Addendum)
"                                    Anesthesia Evaluation  Patient identified by MRN, date of birth, ID band Patient awake    Reviewed: Allergy  & Precautions, NPO status , Patient's Chart, lab work & pertinent test results  History of Anesthesia Complications (+) PONV and history of anesthetic complications  Airway Mallampati: I  TM Distance: >3 FB Neck ROM: Full    Dental  (+) Teeth Intact, Dental Advisory Given   Pulmonary    breath sounds clear to auscultation       Cardiovascular  Rhythm:Regular Rate:Normal   ASD s/p Closure Device  TTE (01/2023): 1. Left ventricular ejection fraction, by estimation, is 45 to 50%. The  left ventricle has mildly decreased function. The left ventricle  demonstrates regional wall motion abnormalities. Septal hypokinesis. Left  ventricular diastolic parameters are  indeterminate.   2. Right ventricular systolic function is mildly reduced. The right  ventricular size is normal. There is normal pulmonary artery systolic  pressure.   3. S/p Amplatzer septal occluder device. No residual shunt seen.   4. The mitral valve is normal in structure. Trivial mitral valve  regurgitation. No evidence of mitral stenosis.   5. The aortic valve is tricuspid. Aortic valve regurgitation is trivial.  No aortic stenosis is present.   6. The inferior vena cava is normal in size with greater than 50%  respiratory variability, suggesting right atrial pressure of 3 mmHg.       Neuro/Psych  Headaches CVA (2011; RUE weakness)    GI/Hepatic Neg liver ROS,GERD  Medicated and Controlled,,Celiac Disease   Endo/Other    Renal/GU negative Renal ROS     Musculoskeletal  (+) Arthritis ,  Fibromyalgia -  Abdominal   Peds  Hematology  Hgb 11.8, Plts 364K (01/11/24)     Anesthesia Other Findings   Reproductive/Obstetrics  Hx of DCIS Breast Cancer                              Anesthesia Physical Anesthesia  Plan  ASA: 2  Anesthesia Plan: MAC   Post-op Pain Management:    Induction: Intravenous  PONV Risk Score and Plan: 2 and Ondansetron , Propofol  infusion, Treatment may vary due to age or medical condition and Midazolam   Airway Management Planned: Natural Airway and Simple Face Mask  Additional Equipment: None  Intra-op Plan:   Post-operative Plan:   Informed Consent: I have reviewed the patients History and Physical, chart, labs and discussed the procedure including the risks, benefits and alternatives for the proposed anesthesia with the patient or authorized representative who has indicated his/her understanding and acceptance.     Dental advisory given  Plan Discussed with: CRNA  Anesthesia Plan Comments:         Anesthesia Quick Evaluation  "

## 2024-02-01 NOTE — Discharge Instructions (Addendum)
 You may shower starting tomorrow  Ice pack, tylenol , and ibuprofen  also for pain  No vigorous activity for one week  No Tylenol  before 1:15pm. No ibuprofen  before 5:00pm.  Post Anesthesia Home Care Instructions  Activity: Get plenty of rest for the remainder of the day. A responsible individual must stay with you for 24 hours following the procedure.  For the next 24 hours, DO NOT: -Drive a car -Advertising copywriter -Drink alcoholic beverages -Take any medication unless instructed by your physician -Make any legal decisions or sign important papers.  Meals: Start with liquid foods such as gelatin or soup. Progress to regular foods as tolerated. Avoid greasy, spicy, heavy foods. If nausea and/or vomiting occur, drink only clear liquids until the nausea and/or vomiting subsides. Call your physician if vomiting continues.  Special Instructions/Symptoms: Your throat may feel dry or sore from the anesthesia or the breathing tube placed in your throat during surgery. If this causes discomfort, gargle with warm salt water. The discomfort should disappear within 24 hours.

## 2024-02-02 ENCOUNTER — Encounter (HOSPITAL_BASED_OUTPATIENT_CLINIC_OR_DEPARTMENT_OTHER): Payer: Self-pay | Admitting: Surgery

## 2024-02-03 ENCOUNTER — Encounter: Payer: Self-pay | Admitting: Hematology and Oncology

## 2024-02-07 ENCOUNTER — Encounter: Payer: Self-pay | Admitting: Hematology and Oncology

## 2024-02-07 ENCOUNTER — Telehealth: Payer: Self-pay

## 2024-02-07 ENCOUNTER — Other Ambulatory Visit (HOSPITAL_COMMUNITY): Payer: Self-pay

## 2024-02-07 ENCOUNTER — Other Ambulatory Visit (HOSPITAL_BASED_OUTPATIENT_CLINIC_OR_DEPARTMENT_OTHER): Payer: Self-pay

## 2024-02-07 ENCOUNTER — Other Ambulatory Visit: Payer: Self-pay

## 2024-02-07 MED ORDER — NYSTATIN 100000 UNIT/ML MT SUSP
5.0000 mL | Freq: Three times a day (TID) | OROMUCOSAL | 1 refills | Status: DC | PRN
  Filled 2024-02-07: qty 240, 16d supply, fill #0

## 2024-02-07 MED ORDER — PREDNISOLONE SODIUM PHOSPHATE 15 MG/5ML PO SOLN
OROMUCOSAL | 2 refills | Status: AC
  Filled 2024-02-07: qty 250, 17d supply, fill #0

## 2024-02-07 MED ORDER — CLOTRIMAZOLE 10 MG MT TROC
10.0000 mg | Freq: Every day | OROMUCOSAL | 0 refills | Status: AC
  Filled 2024-02-07 – 2024-02-08 (×2): qty 140, 28d supply, fill #0

## 2024-02-07 MED ORDER — FLUCONAZOLE 100 MG PO TABS
100.0000 mg | ORAL_TABLET | Freq: Every day | ORAL | 1 refills | Status: AC
  Filled 2024-02-07: qty 7, 7d supply, fill #0

## 2024-02-07 NOTE — Telephone Encounter (Signed)
 Pt called and states she is still experiencing thrush and is asking if there is anything else we can do for her. She states she is seeing Dr Roark at the end of this month, and that is the soonest they could schedule her. Per MD, refill sent for Magic Mouth Wash, Diflucan  and Clotrimazole  troche. Pt is aware.  Call placed to Dr Roark office to see if there is any way they can see pt sooner. LVM for nurse.

## 2024-02-08 ENCOUNTER — Other Ambulatory Visit (HOSPITAL_COMMUNITY): Payer: Self-pay

## 2024-02-08 ENCOUNTER — Other Ambulatory Visit: Payer: Self-pay

## 2024-02-08 NOTE — Therapy (Signed)
 " OUTPATIENT PHYSICAL THERAPY  UPPER EXTREMITY ONCOLOGY EVALUATION  Patient Name: Tonya Andrews MRN: [id number] DOB:11-Mar-1975, 49 y.o., female Today's Date: 02/09/2024  END OF SESSION:  PT End of Session - 02/09/24 1501     Visit Number 1    Number of Visits 9    Authorization Type auth needed    PT Start Time 1200    PT Stop Time 1249    PT Time Calculation (min) 49 min    Activity Tolerance Patient tolerated treatment well    Behavior During Therapy [facility] for tasks assessed/performed          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   Celiac disease    Fibromyalgia    Gallstones    GERD (gastroesophageal reflux disease)    Migraine headache    Dr Oneita   Nasal polyp    Nerve pain    PFO (patent foramen ovale) 04/2009   by bubble study/TEE, Dr Wonda, cardiology   PONV (postoperative nausea and vomiting)    Seasonal allergies    Stroke (HCC) 04/2009, 07/2009   Dr Rosemarie right arm weakness   Past Surgical History:  Procedure Laterality Date   BREAST BIOPSY Left 08/19/2023   US  LT BREAST BX W LOC DEV 1ST LESION IMG BX SPEC US  GUIDE 08/19/2023 [facility]AMMOGRAPHY   CHOLECYSTECTOMY N/A 05/30/2019   Procedure: LAPAROSCOPIC CHOLECYSTECTOMY;  Surgeon: Tanda Locus, MD;  Location: [facility];  Service: General;  Laterality: N/A;   HARDWARE REMOVAL Right 06/15/2023   Procedure: REMOVAL, HARDWARE;  Surgeon: Sheril Coy, MD;  Location: [facility];  Service: Orthopedics;  Laterality: Right;   KNEE SURGERY Right    x2   PATENT FORAMEN OVALE(PFO) CLOSURE  09/2009   transcath closure    PORT-A-CATH REMOVAL Right 02/01/2024   Procedure: REMOVAL PORT-A-CATH;  Surgeon: Vernetta Berg, MD;  Location: [facility];  Service: General;  Laterality: Right;   PORTACATH PLACEMENT N/A 10/01/2023   Procedure: INSERTION, TUNNELED CENTRAL VENOUS DEVICE, WITH PORT;  Surgeon: Vernetta Berg, MD;  Location: [facility];   Service: General;  Laterality: N/A;  PORT-A-CATH INSERTION WITH ULTRASOUND GUIDANCE   SENTINEL NODE BIOPSY Left 10/01/2023   Procedure: BIOPSY, LYMPH NODE, SENTINEL;  Surgeon: Vernetta Berg, MD;  Location: [facility];  Service: General;  Laterality: Left;   TISSUE EXPANDER PLACEMENT Bilateral 10/01/2023   Procedure: INSERTION, TISSUE EXPANDER;  Surgeon: Montorfano, Lisandro M, MD;  Location: [facility];  Service: Plastics;  Laterality: Bilateral;  WITH ACELLULAR DERMAL MATRIX   TOTAL KNEE ARTHROPLASTY Right 06/15/2023   Procedure: ARTHROPLASTY, KNEE, TOTAL;  Surgeon: Sheril Coy, MD;  Location: [facility];  Service: Orthopedics;  Laterality: Right;   TOTAL MASTECTOMY Bilateral 10/01/2023   Procedure: MASTECTOMY, SIMPLE;  Surgeon: Vernetta Berg, MD;  Location: [facility];  Service: General;  Laterality: Bilateral;   WISDOM TOOTH EXTRACTION     Patient Active Problem List   Diagnosis Date Noted   Breast cancer (HCC) 10/01/2023   Hospice care patient 09/17/2023   Genetic testing 09/17/2023   Malignant neoplasm of upper-inner quadrant of left breast in female, estrogen receptor negative (HCC) 09/01/2023   Primary localized osteoarthritis of right knee 06/15/2023   Gastrointestinal complaints 02/25/2022   Dietary counseling and surveillance 02/25/2022   Gastroesophageal reflux disease 02/25/2022   Other allergic rhinitis 02/25/2022   Reactive airway disease 02/25/2022   Excessive daytime  sleepiness 09/17/2021   Cholelithiasis without obstruction 08/18/2021   Fibromyalgia 08/18/2021   Paresthesia of arm 08/18/2021   Twitching 08/18/2021   Vitamin B12 deficiency (non anemic) 08/18/2021   Cerebrovascular accident (HCC) 04/18/2020   PFO (patent foramen ovale) 05/03/2009   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   History of cardioembolic cerebrovascular accident (CVA) 05/02/2009   Migraine 05/02/2009    PCP: Reena Duck,  MD  REFERRING PROVIDER: Mackey Chad, MD  REFERRING DIAG: Left breast cancer / cording  THERAPY DIAG:  Aftercare following surgery for neoplasm  Invasive ductal carcinoma of left breast (HCC)  Abnormal posture  At risk for lymphedema  Pain in left arm  ONSET DATE: 08/22/23  Rationale for Evaluation and Treatment: Rehabilitation  SUBJECTIVE:                                                                                                                                                                                           SUBJECTIVE STATEMENT: I have some cording that is coming and going.  It is hard to do the shower curtain.  It goes all the way down to the thumb.  Denies swollen.    PERTINENT HISTORY: Post bilateral mastectomy on 10/01/23 with expander placement. 3 negative nodes removed on the Lt.  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 completed chemotherapy 11/09/23. Rt TKR 06/27/23. Hx of 2 strokes with Rt sided weakness. Hx of hole in the heart. Fibromyalgia. No radiation needed.    PAIN:  Are you having pain? Yes, only when I reach and stretch my arm NPRS scale: 5/10 at worst  Pain location: from thumb to axilla  Pain orientation: Left  PAIN TYPE: aching and burning Pain description: intermittent  Aggravating factors: reaching  Relieving factors: advil    PRECAUTIONS: Left lymphedema risk   RED FLAGS: None   WEIGHT BEARING RESTRICTIONS: No  FALLS:  Has patient fallen in last 6 months? No  LIVING ENVIRONMENT: Lives with: lives with their family and lives with their spouse and 2 kids, 1 at college  OCCUPATION: works at a preschool and goes back in Aug  LEISURE: walking  HAND DOMINANCE: right   PRIOR LEVEL OF FUNCTION: Independent  PATIENT GOALS: get rid of cording pain   OBJECTIVE: Note: Objective measures were completed at Evaluation unless otherwise noted.  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  POSTURE: WNL  UPPER EXTREMITY AROM/PROM:  A/PROM RIGHT   eval   Shoulder extension   Shoulder flexion   Shoulder abduction   Shoulder internal rotation   Shoulder external rotation     (Blank rows = not tested)  A/PROM LEFT   eval 02/09/24  Shoulder extension 67 63 - pull   Shoulder flexion 160 165 - pull in elbow  Shoulder abduction 169 170 - pull  Shoulder internal rotation    Shoulder external rotation 110 100 - pull    (Blank rows = not tested)  CERVICAL AROM: All within normal limits:   L-DEX LYMPHEDEMA SCREENING: L-DEX LYMPHEDEMA SCREENING Measurement Type: Unilateral L-DEX MEASUREMENT EXTREMITY: Upper Extremity POSITION : Standing DOMINANT SIDE: Right At Risk Side: Left BASELINE SCORE (UNILATERAL): 4.1 L-DEX SCORE (UNILATERAL): 14.2 VALUE CHANGE (UNILAT): 10.1   QUICK DASH SURVEY: 38% limited from 25%                                                                                                                            TREATMENT DATE:  02/09/24: Eval performed Issued pt a size 3 black medi harmony sleeve and size 2 gauntlet due to increased SOZO and thumb pain from cording Discussed wear, washing, and not sleeping in the garment and donning Supine PROM Lt shoulder into flexion, abd, D2  MFR to axilla and upper arm with edu on cording and lymphedema Edu on self circles in axilla, elbow    PATIENT EDUCATION:  Education details: per today's note Person educated: Patient and Child(ren) Education method: Explanation, Demonstration, Tactile cues, and Verbal cues Education comprehension: verbalized understanding  HOME EXERCISE PROGRAM: Wear compression, circles in axilla and elbow, continue with  stretches  ASSESSMENT:  CLINICAL IMPRESSION: Patient is a 49 y.o. female who was seen today for physical therapy evaluation and treatment for new onset of Lt UE cording and increasing SOZO score.  Pt unaware of any reason for onset so this may be chemo related.  She was given a sleeve to starting wearing and a gauntlet was included because a lot of her pain is in the thumb from the cording.  Then started MFR to the Lt arm which was tolerated well. Pt will do PT visits up until the 1 month SOZO recheck.    OBJECTIVE IMPAIRMENTS: decreased activity tolerance, decreased knowledge of condition, and decreased knowledge of use of DME. , edema, pain, decreased range of motion  ACTIVITY LIMITATIONS: carrying and lifting  PARTICIPATION LIMITATIONS: cleaning, shopping, and community activity  PERSONAL FACTORS: 1 comorbidity: SLNB are also affecting patient's functional outcome.   REHAB POTENTIAL: Excellent  CLINICAL DECISION MAKING: Stable/uncomplicated  EVALUATION COMPLEXITY: Low  GOALS: Goals reviewed with patient? Yes  SHORT TERM GOALS: Target date: 02/09/24  Pt will be educated on compression donning, care, and use to reduce risk of BCRL Baseline: Goal status: MET   LONG TERM GOALS: Target date: 03/09/24  Pt will return to no pulling in the upper arm  with Lt shoulder AROM Baseline:  Goal status: INITIAL  2.  Pt will be ind with self MLD and use of compression for the Lt UE to manage her stage 0 lymphedema  Baseline:  Goal status: INITIAL  3.  Pt will be ind with final HEP Baseline:  Goal status: INITIAL   PLAN:  PT FREQUENCY: 1-2x/week  PT DURATION: 4 weeks  PLANNED INTERVENTIONS: 97164- PT Re-evaluation, 97110-Therapeutic exercises, 97530- Therapeutic activity, 97112- Neuromuscular re-education, 97535- Self Care, [id number]- Manual therapy, [phone]- Orthotic Initial, [phone]- Prosthetic Initial , Patient/Family education, Balance training, Joint mobilization, Therapeutic exercises,  Therapeutic activity, Neuromuscular re-education, Gait training, and Self Care  PLAN FOR NEXT SESSION: how is sleeve? Enter this in spreadsheet, Lt UE cording, teach MLD and include elbow  Larue Saddie SAUNDERS, PT 02/09/2024, 8:42 PM  "

## 2024-02-09 ENCOUNTER — Encounter: Payer: Self-pay | Admitting: Rehabilitation

## 2024-02-09 ENCOUNTER — Ambulatory Visit: Payer: Self-pay | Attending: Surgery | Admitting: Rehabilitation

## 2024-02-09 ENCOUNTER — Encounter: Payer: Self-pay | Admitting: Hematology and Oncology

## 2024-02-09 ENCOUNTER — Other Ambulatory Visit (HOSPITAL_COMMUNITY): Payer: Self-pay

## 2024-02-09 ENCOUNTER — Other Ambulatory Visit: Payer: Self-pay

## 2024-02-09 DIAGNOSIS — Z9189 Other specified personal risk factors, not elsewhere classified: Secondary | ICD-10-CM | POA: Diagnosis present

## 2024-02-09 DIAGNOSIS — M79602 Pain in left arm: Secondary | ICD-10-CM | POA: Diagnosis present

## 2024-02-09 DIAGNOSIS — Z483 Aftercare following surgery for neoplasm: Secondary | ICD-10-CM | POA: Insufficient documentation

## 2024-02-09 DIAGNOSIS — Z171 Estrogen receptor negative status [ER-]: Secondary | ICD-10-CM | POA: Insufficient documentation

## 2024-02-09 DIAGNOSIS — C50912 Malignant neoplasm of unspecified site of left female breast: Secondary | ICD-10-CM | POA: Insufficient documentation

## 2024-02-09 DIAGNOSIS — R293 Abnormal posture: Secondary | ICD-10-CM | POA: Insufficient documentation

## 2024-02-09 DIAGNOSIS — M79601 Pain in right arm: Secondary | ICD-10-CM | POA: Insufficient documentation

## 2024-02-09 DIAGNOSIS — C50212 Malignant neoplasm of upper-inner quadrant of left female breast: Secondary | ICD-10-CM | POA: Insufficient documentation

## 2024-02-15 ENCOUNTER — Other Ambulatory Visit: Payer: Self-pay

## 2024-02-15 ENCOUNTER — Ambulatory Visit

## 2024-02-15 DIAGNOSIS — Z483 Aftercare following surgery for neoplasm: Secondary | ICD-10-CM

## 2024-02-15 DIAGNOSIS — M79602 Pain in left arm: Secondary | ICD-10-CM

## 2024-02-15 DIAGNOSIS — R293 Abnormal posture: Secondary | ICD-10-CM

## 2024-02-15 DIAGNOSIS — C50912 Malignant neoplasm of unspecified site of left female breast: Secondary | ICD-10-CM

## 2024-02-15 DIAGNOSIS — Z9189 Other specified personal risk factors, not elsewhere classified: Secondary | ICD-10-CM

## 2024-02-15 NOTE — Therapy (Signed)
 " OUTPATIENT PHYSICAL THERAPY  UPPER EXTREMITY ONCOLOGY TREATMENT  Patient Name: Tonya Andrews MRN: [id number] DOB:11-10-1975, 49 y.o., female Today's Date: 02/15/2024  END OF SESSION:  PT End of Session - 02/15/24 1157     Visit Number 2    Number of Visits 9    Authorization Time Period 02/09/2024- 05/08/2024    Authorization - Visit Number 2    Authorization - Number of Visits 12    PT Start Time 1200    PT Stop Time 1254    PT Time Calculation (min) 54 min    Activity Tolerance Patient tolerated treatment well    Behavior During Therapy [facility] for tasks assessed/performed          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   Celiac disease    Fibromyalgia    Gallstones    GERD (gastroesophageal reflux disease)    Migraine headache    Dr Oneita   Nasal polyp    Nerve pain    PFO (patent foramen ovale) 04/2009   by bubble study/TEE, Dr Wonda, cardiology   PONV (postoperative nausea and vomiting)    Seasonal allergies    Stroke (HCC) 04/2009, 07/2009   Dr Rosemarie right arm weakness   Past Surgical History:  Procedure Laterality Date   BREAST BIOPSY Left 08/19/2023   US  LT BREAST BX W LOC DEV 1ST LESION IMG BX SPEC US  GUIDE 08/19/2023 [facility] MAMMOGRAPHY   CHOLECYSTECTOMY N/A 05/30/2019   Procedure: LAPAROSCOPIC CHOLECYSTECTOMY;  Surgeon: Tanda Locus, MD;  Location: [facility];  Service: General;  Laterality: N/A;   HARDWARE REMOVAL Right 06/15/2023   Procedure: REMOVAL, HARDWARE;  Surgeon: Sheril Coy, MD;  Location: [facility];  Service: Orthopedics;  Laterality: Right;   KNEE SURGERY Right    x2   PATENT FORAMEN OVALE(PFO) CLOSURE  09/2009   transcath closure    PORT-A-CATH REMOVAL Right 02/01/2024   Procedure: REMOVAL PORT-A-CATH;  Surgeon: Vernetta Berg, MD;  Location: [facility];  Service: General;  Laterality: Right;   PORTACATH PLACEMENT N/A 10/01/2023   Procedure: INSERTION, TUNNELED CENTRAL VENOUS  DEVICE, WITH PORT;  Surgeon: Vernetta Berg, MD;  Location: [facility];  Service: General;  Laterality: N/A;  PORT-A-CATH INSERTION WITH ULTRASOUND GUIDANCE   SENTINEL NODE BIOPSY Left 10/01/2023   Procedure: BIOPSY, LYMPH NODE, SENTINEL;  Surgeon: Vernetta Berg, MD;  Location: [facility];  Service: General;  Laterality: Left;   TISSUE EXPANDER PLACEMENT Bilateral 10/01/2023   Procedure: INSERTION, TISSUE EXPANDER;  Surgeon: Montorfano, Lisandro M, MD;  Location: [facility];  Service: Plastics;  Laterality: Bilateral;  WITH ACELLULAR DERMAL MATRIX   TOTAL KNEE ARTHROPLASTY Right 06/15/2023   Procedure: ARTHROPLASTY, KNEE, TOTAL;  Surgeon: Sheril Coy, MD;  Location: [facility];  Service: Orthopedics;  Laterality: Right;   TOTAL MASTECTOMY Bilateral 10/01/2023   Procedure: MASTECTOMY, SIMPLE;  Surgeon: Vernetta Berg, MD;  Location: [facility];  Service: General;  Laterality: Bilateral;   WISDOM TOOTH EXTRACTION     Patient Active Problem List   Diagnosis Date Noted   Breast cancer (HCC) 10/01/2023   Hospice care patient 09/17/2023   Genetic testing 09/17/2023   Malignant neoplasm of upper-inner quadrant of left breast in female, estrogen receptor negative (HCC) 09/01/2023   Primary localized osteoarthritis of right knee 06/15/2023   Gastrointestinal complaints 02/25/2022   Dietary counseling and surveillance 02/25/2022   Gastroesophageal reflux  disease 02/25/2022   Other allergic rhinitis 02/25/2022   Reactive airway disease 02/25/2022   Excessive daytime sleepiness 09/17/2021   Cholelithiasis without obstruction 08/18/2021   Fibromyalgia 08/18/2021   Paresthesia of arm 08/18/2021   Twitching 08/18/2021   Vitamin B12 deficiency (non anemic) 08/18/2021   Cerebrovascular accident (HCC) 04/18/2020   PFO (patent foramen ovale) 05/03/2009   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   History of cardioembolic cerebrovascular  accident (CVA) 05/02/2009   Migraine 05/02/2009    PCP: Reena Duck, MD  REFERRING PROVIDER: Mackey Chad, MD  REFERRING DIAG: Left breast cancer / cording  THERAPY DIAG:  Aftercare following surgery for neoplasm  Invasive ductal carcinoma of left breast (HCC)  Abnormal posture  At risk for lymphedema  Pain in left arm  ONSET DATE: 08/22/23  Rationale for Evaluation and Treatment: Rehabilitation  SUBJECTIVE:                                                                                                                                                                                           SUBJECTIVE STATEMENT:      I have been wearing the sleeve and it feels OK, but the gauntlet is too tight. She doesn't notice the swelling in her hand much  when she wears the sleeve but the top of the hand may swell a little.. She is doing the stretches, and it doesn't seem as tight.   PERTINENT HISTORY: Post bilateral mastectomy on 10/01/23 with expander placement. 3 negative nodes removed on the Lt.  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 completed chemotherapy 11/09/23. Rt TKR 06/27/23. Hx of 2 strokes with Rt sided weakness. Hx of hole in the heart. Fibromyalgia. No radiation needed.    PAIN:  Are you having pain? Yes, only when I reach and stretch my arm NPRS scale: 3/10 at worst  Pain location: from thumb to axilla  Pain orientation: Left  PAIN TYPE: aching and burning Pain description: intermittent  Aggravating factors: reaching  Relieving factors: advil    PRECAUTIONS: Left lymphedema risk   RED FLAGS: None   WEIGHT BEARING RESTRICTIONS: No  FALLS:  Has patient fallen in last 6 months? No  LIVING ENVIRONMENT: Lives with: lives with their family and lives with their spouse and 2 kids, 1 at college  OCCUPATION: works at a preschool and goes back in Aug  LEISURE: walking  HAND DOMINANCE: right   PRIOR LEVEL OF FUNCTION: Independent  PATIENT GOALS: get rid of cording  pain   OBJECTIVE: Note: Objective measures were completed at Evaluation unless otherwise noted.  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    POSTURE: WNL  UPPER EXTREMITY AROM/PROM:  A/PROM RIGHT   eval   Shoulder extension   Shoulder flexion   Shoulder abduction   Shoulder internal rotation   Shoulder external rotation     (Blank rows = not tested)  A/PROM LEFT   eval 02/09/24  Shoulder extension 67 63 - pull   Shoulder flexion 160 165 - pull in elbow  Shoulder abduction 169 170 - pull  Shoulder internal rotation    Shoulder external rotation 110 100 - pull    (Blank rows = not tested)  CERVICAL AROM: All within normal limits:   L-DEX LYMPHEDEMA SCREENING:     QUICK DASH SURVEY: 38% limited from 25%                                                                                                                            TREATMENT DATE:  02/15/2023 Showed wall slide stretch for cords, wrist extension for cording and trying different positions. Importance of stretching gently, and not over doing it too much. Release to left UE cords with arm resting on pillow at approx 120 degrees into scaption. Pt could feel appropriate tightness With repositioning arm pt had shoulder pain and was unable to position well to work on cords without shoulder pain. Placed arm on pillow at approx 90 deg. Abd and applied cocoa butter to hand, forearm and upper arm. Cupping with smallest cup to left thumb and web space and with slightly larger clear cup to forearm and upper arm. PROM left shoulder with scapular depression for flex, scaption, abd, ER Checked for size 3 gauntlet but we didn't have one. Tried a  size 4 but too big. Pt had checked [employer] and will order from there or compression guru. Explained to pt how she can try to stretch the present gauntlet she has.   02/09/24: Eval performed Issued pt a size 3 black medi harmony sleeve and size 2 gauntlet due to increased SOZO and thumb pain from cording Discussed wear, washing, and not sleeping in the garment and donning Supine PROM Lt shoulder into flexion, abd, D2  MFR to axilla and upper arm with edu on cording and lymphedema Edu on self circles in axilla, elbow    PATIENT EDUCATION:  Education details: per today's note Person educated: Patient and Child(ren) Education method: Explanation, Demonstration, Tactile cues, and Verbal cues Education comprehension: verbalized understanding  HOME EXERCISE PROGRAM: Wear compression, circles in axilla and elbow, continue with stretches  ASSESSMENT:  CLINICAL IMPRESSION: Pt had intermittent shoulder pain with release techniques which required repositioning and then changing position altogether to do cupping. Initiated cupping to left UE, left thumb and web space. Instructed in wrist ext stretches  and wall slides.   EVAL Patient is a 49 y.o. female who was seen today for physical therapy evaluation and treatment for new onset of Lt UE cording and increasing SOZO score.  Pt unaware of any reason for onset so this may be chemo related.  She was given a sleeve to starting wearing and a gauntlet was included because a lot of her pain is in the thumb from the cording.  Then started MFR to the Lt arm which was tolerated well. Pt will do PT visits up until the 1 month SOZO recheck.    OBJECTIVE IMPAIRMENTS: decreased activity tolerance, decreased knowledge of condition, and decreased knowledge of use of DME. , edema, pain, decreased range of motion  ACTIVITY LIMITATIONS: carrying and lifting  PARTICIPATION LIMITATIONS: cleaning, shopping, and community activity  PERSONAL FACTORS: 1 comorbidity:  SLNB are also affecting patient's functional outcome.   REHAB POTENTIAL: Excellent  CLINICAL DECISION MAKING: Stable/uncomplicated  EVALUATION COMPLEXITY: Low  GOALS: Goals reviewed with patient? Yes  SHORT TERM GOALS: Target date: 02/09/24  Pt will be educated on compression donning, care, and use to reduce risk of BCRL Baseline: Goal status: MET   LONG TERM GOALS: Target date: 03/09/24  Pt will return to no pulling in the upper arm with Lt shoulder AROM Baseline:  Goal status: INITIAL  2.  Pt will be ind with self MLD and use of compression for the Lt UE to manage her stage 0 lymphedema  Baseline:  Goal status: INITIAL  3.  Pt will be ind with final HEP Baseline:  Goal status: INITIAL   PLAN:  PT FREQUENCY: 1-2x/week  PT DURATION: 4 weeks  PLANNED INTERVENTIONS: 97164- PT Re-evaluation, 97110-Therapeutic exercises, 97530- Therapeutic activity, 97112- Neuromuscular re-education, 97535- Self Care, 02859- Manual therapy, [id number]- Orthotic Initial, [id number]- Prosthetic Initial , Patient/Family education, Balance training, Joint mobilization, Therapeutic exercises, Therapeutic activity, Neuromuscular re-education, Gait training, and Self Care  PLAN FOR NEXT SESSION: how is sleeve? Lt UE cording, teach MLD and include elbow. Grayce JINNY Sheldon, PT 02/15/2024, 1:06 PM  "

## 2024-02-17 ENCOUNTER — Other Ambulatory Visit (HOSPITAL_COMMUNITY): Payer: Self-pay

## 2024-02-22 ENCOUNTER — Ambulatory Visit: Admitting: Rehabilitation

## 2024-02-22 ENCOUNTER — Encounter: Payer: Self-pay | Admitting: Rehabilitation

## 2024-02-22 DIAGNOSIS — R293 Abnormal posture: Secondary | ICD-10-CM

## 2024-02-22 DIAGNOSIS — C50912 Malignant neoplasm of unspecified site of left female breast: Secondary | ICD-10-CM

## 2024-02-22 DIAGNOSIS — M79602 Pain in left arm: Secondary | ICD-10-CM

## 2024-02-22 DIAGNOSIS — M79601 Pain in right arm: Secondary | ICD-10-CM

## 2024-02-22 DIAGNOSIS — Z483 Aftercare following surgery for neoplasm: Secondary | ICD-10-CM

## 2024-02-22 DIAGNOSIS — Z9189 Other specified personal risk factors, not elsewhere classified: Secondary | ICD-10-CM

## 2024-02-22 NOTE — Therapy (Signed)
 " OUTPATIENT PHYSICAL THERAPY  UPPER EXTREMITY ONCOLOGY TREATMENT  Patient Name: Tonya Andrews MRN: [id number] DOB:20-Jan-1976, 49 y.o., female Today's Date: 02/22/2024  END OF SESSION:  PT End of Session - 02/22/24 1459     Visit Number 3    Number of Visits 9    Authorization Type 12 visits 02/09/24-05/08/24    Authorization - Visit Number 3    Authorization - Number of Visits 12    PT Start Time 1400    PT Stop Time 1445    PT Time Calculation (min) 45 min    Activity Tolerance Patient tolerated treatment well    Behavior During Therapy [facility] for tasks assessed/performed           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   Celiac disease    Fibromyalgia    Gallstones    GERD (gastroesophageal reflux disease)    Migraine headache    Dr Oneita   Nasal polyp    Nerve pain    PFO (patent foramen ovale) 04/2009   by bubble study/TEE, Dr Wonda, cardiology   PONV (postoperative nausea and vomiting)    Seasonal allergies    Stroke (HCC) 04/2009, 07/2009   Dr Rosemarie right arm weakness   Past Surgical History:  Procedure Laterality Date   BREAST BIOPSY Left 08/19/2023   US  LT BREAST BX W LOC DEV 1ST LESION IMG BX SPEC US  GUIDE 08/19/2023 [facility] MAMMOGRAPHY   CHOLECYSTECTOMY N/A 05/30/2019   Procedure: LAPAROSCOPIC CHOLECYSTECTOMY;  Surgeon: Tanda Locus, MD;  [facility];  Service: General;  Laterality: N/A;   HARDWARE REMOVAL Right 06/15/2023   Procedure: REMOVAL, HARDWARE;  Surgeon: Sheril Coy, MD;  [facility]: [facility];  Service: Orthopedics;  Laterality: Right;   KNEE SURGERY Right    x2   PATENT FORAMEN OVALE(PFO) CLOSURE  09/2009   transcath closure    PORT-A-CATH REMOVAL Right 02/01/2024   Procedure: REMOVAL PORT-A-CATH;  Surgeon: Vernetta Berg, MD;  Location: [facility];  Service: General;  Laterality: Right;   PORTACATH PLACEMENT N/A 10/01/2023   Procedure: INSERTION, TUNNELED CENTRAL VENOUS  DEVICE, WITH PORT;  Surgeon: Vernetta Berg, MD;  Location: [facility];  Service: General;  Laterality: N/A;  PORT-A-CATH INSERTION WITH ULTRASOUND GUIDANCE   SENTINEL NODE BIOPSY Left 10/01/2023   Procedure: BIOPSY, LYMPH NODE, SENTINEL;  Surgeon: Vernetta Berg, MD;  Location: [facility];  Service: General;  Laterality: Left;   TISSUE EXPANDER PLACEMENT Bilateral 10/01/2023   Procedure: INSERTION, TISSUE EXPANDER;  Surgeon: Montorfano, Lisandro M, MD;  Location: [facility];  Service: Plastics;  Laterality: Bilateral;  WITH ACELLULAR DERMAL MATRIX   TOTAL KNEE ARTHROPLASTY Right 06/15/2023   Procedure: ARTHROPLASTY, KNEE, TOTAL;  Surgeon: Sheril Coy, MD;  Location: [facility];  Service: Orthopedics;  Laterality: Right;   TOTAL MASTECTOMY Bilateral 10/01/2023   Procedure: MASTECTOMY, SIMPLE;  Surgeon: Vernetta Berg, MD;  Location: [facility];  Service: General;  Laterality: Bilateral;   WISDOM TOOTH EXTRACTION     Patient Active Problem List   Diagnosis Date Noted   Breast cancer (HCC) 10/01/2023   Hospice care patient 09/17/2023   Genetic testing 09/17/2023   Malignant neoplasm of upper-inner quadrant of left breast in female, estrogen receptor negative (HCC) 09/01/2023   Primary localized osteoarthritis of right knee 06/15/2023   Gastrointestinal complaints 02/25/2022   Dietary counseling and surveillance 02/25/2022   Gastroesophageal  reflux disease 02/25/2022   Other allergic rhinitis 02/25/2022   Reactive airway disease 02/25/2022   Excessive daytime sleepiness 09/17/2021   Cholelithiasis without obstruction 08/18/2021   Fibromyalgia 08/18/2021   Paresthesia of arm 08/18/2021   Twitching 08/18/2021   Vitamin B12 deficiency (non anemic) 08/18/2021   Cerebrovascular accident (HCC) 04/18/2020   PFO (patent foramen ovale) 05/03/2009   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   History of cardioembolic cerebrovascular  accident (CVA) 05/02/2009   Migraine 05/02/2009    PCP: Reena Duck, MD  REFERRING PROVIDER: Mackey Chad, MD  REFERRING DIAG: Left breast cancer / cording  THERAPY DIAG:  Aftercare following surgery for neoplasm  Invasive ductal carcinoma of left breast (HCC)  Abnormal posture  At risk for lymphedema  Pain in left arm  Pain in right arm  ONSET DATE: 08/22/23  Rationale for Evaluation and Treatment: Rehabilitation  SUBJECTIVE:                                                                                                                                                                                           SUBJECTIVE STATEMENT:     I got a glove thing on amaz[employer].  It is starting to feel better.   PERTINENT HISTORY: Post bilateral mastectomy on 10/01/23 with expander placement. 3 negative nodes removed on the Lt.  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 completed chemotherapy 11/09/23. Rt TKR 06/27/23. Hx of 2 strokes with Rt sided weakness. Hx of hole in the heart. Fibromyalgia. No radiation needed.    PAIN:  Are you having pain? Yes, only when I reach and stretch my arm NPRS scale: 2/10 at worst  Pain location: from thumb to axilla  Pain orientation: Left  PAIN TYPE: aching and burning Pain description: intermittent  Aggravating factors: reaching  Relieving factors: advil    PRECAUTIONS: Left lymphedema risk   RED FLAGS: None   WEIGHT BEARING RESTRICTIONS: No  FALLS:  Has patient fallen in last 6 months? No  LIVING ENVIRONMENT: Lives with: lives with their family and lives with their spouse and 2 kids, 1 at college  OCCUPATION: works at a preschool and goes back in Aug  LEISURE: walking  HAND DOMINANCE: right   PRIOR LEVEL OF FUNCTION: Independent  PATIENT GOALS: get rid of cording pain   OBJECTIVE: Note: Objective measures were completed at Evaluation unless otherwise noted.  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    POSTURE: WNL  UPPER EXTREMITY AROM/PROM:  A/PROM RIGHT   eval   Shoulder extension   Shoulder flexion   Shoulder abduction   Shoulder internal rotation   Shoulder external rotation     (Blank rows = not tested)  A/PROM LEFT   eval 02/09/24  Shoulder extension 67 63 - pull   Shoulder flexion 160 165 - pull in elbow  Shoulder abduction 169 170 - pull  Shoulder internal rotation    Shoulder external rotation 110 100 - pull    (Blank rows = not tested)  CERVICAL AROM: All within normal limits:   L-DEX LYMPHEDEMA SCREENING:  QUICK DASH SURVEY: 38% limited from 25%                                                                                                                            TREATMENT DATE:  02/22/24 Release to left UE cords with arm resting on pillow at approx 120 degrees into scaption. No pain today with positioning  Cupping with smallest cup to left thumb and web space and with slightly larger clear cup to forearm and upper arm. PROM left shoulder with scapular depression for flex, scaption, abd, ER MFR along the arm at around 45deg abduction Then MLD to the Lt UE omitting inguinal pathway today  02/15/2023 Showed wall slide stretch for cords, wrist extension for cording and trying different positions. Importance of stretching gently, and not over doing it too much. Release to left UE cords with arm resting on pillow at approx 120 degrees into scaption. Pt could feel appropriate tightness With repositioning arm pt had shoulder pain and was unable to position well to work on cords without shoulder pain. Placed arm on pillow at approx 90 deg. Abd and applied cocoa butter to hand, forearm and upper arm. Cupping  with smallest cup to left thumb and web space and with slightly larger clear cup to forearm and upper arm. PROM left shoulder with scapular depression for flex, scaption, abd, ER Checked for size 3 gauntlet but we didn't have one. Tried a size 4 but too big. Pt had checked [employer] and will order from there or compression guru. Explained to pt how she can try to stretch the present gauntlet she has  02/09/24: Eval performed Issued pt a size 3 black medi harmony sleeve and size 2 gauntlet due to increased SOZO and thumb pain from cording Discussed wear, washing, and not sleeping in the garment and donning Supine PROM Lt shoulder into flexion, abd, D2  MFR to axilla and upper arm with edu on cording and lymphedema Edu on self circles in axilla, elbow    PATIENT EDUCATION:  Education details: per today's note Person educated: Patient and Child(ren) Education method: Explanation, Demonstration, Tactile cues, and Verbal cues Education comprehension: verbalized understanding  HOME EXERCISE PROGRAM: Wear compression, circles in axilla and elbow, continue with stretches  ASSESSMENT:  CLINICAL IMPRESSION: Pt has less tenderness today and is tolerating positioning without pain.  Will continue POC.   EVAL Patient is a 49 y.o. female who was seen today for physical therapy evaluation and treatment for new onset of Lt UE cording and increasing SOZO score.  Pt unaware of any reason for onset so this may be chemo related.  She was given a sleeve to starting wearing and a gauntlet was included because a lot of her pain is in the thumb from the cording.  Then started MFR to the Lt arm which was tolerated well. Pt will do PT visits up until the 1 month SOZO recheck.    OBJECTIVE IMPAIRMENTS: decreased activity tolerance, decreased knowledge of condition, and decreased knowledge of use of DME. , edema, pain, decreased range of motion  ACTIVITY LIMITATIONS: carrying and lifting  PARTICIPATION  LIMITATIONS: cleaning, shopping, and community activity  PERSONAL FACTORS: 1 comorbidity: SLNB are also affecting patient's functional outcome.   REHAB POTENTIAL: Excellent  CLINICAL DECISION MAKING: Stable/uncomplicated  EVALUATION COMPLEXITY: Low  GOALS: Goals reviewed with patient? Yes  SHORT TERM GOALS: Target date: 02/09/24  Pt will be educated on compression donning, care, and use to reduce risk of BCRL Baseline: Goal status: MET   LONG TERM GOALS: Target date: 03/09/24  Pt will return to no pulling in the upper arm with Lt shoulder AROM Baseline:  Goal status: INITIAL  2.  Pt will be ind with self MLD and use of compression for the Lt UE to manage her stage 0 lymphedema  Baseline:  Goal status: INITIAL  3.  Pt will be ind with final HEP Baseline:  Goal status: INITIAL   PLAN:  PT FREQUENCY: 1-2x/week  PT DURATION: 4 weeks  PLANNED INTERVENTIONS: 97164- PT Re-evaluation, 97110-Therapeutic exercises, 97530- Therapeutic activity, 97112- Neuromuscular re-education, 97535- Self Care, 02859- Manual therapy, [id number]- Orthotic Initial, [phone]- Prosthetic Initial , Patient/Family education, Balance training, Joint mobilization, Therapeutic exercises, Therapeutic activity, Neuromuscular re-education, Gait training, and Self Care  PLAN FOR NEXT SESSION: how is sleeve? Lt UE cording, teach MLD and include elbow. Larue Saddie SAUNDERS, PT 02/22/2024, 3:00 PM  "

## 2024-02-25 ENCOUNTER — Ambulatory Visit

## 2024-02-25 DIAGNOSIS — Z9889 Other specified postprocedural states: Secondary | ICD-10-CM

## 2024-02-25 DIAGNOSIS — Z853 Personal history of malignant neoplasm of breast: Secondary | ICD-10-CM | POA: Diagnosis not present

## 2024-02-25 DIAGNOSIS — C50912 Malignant neoplasm of unspecified site of left female breast: Secondary | ICD-10-CM

## 2024-02-25 DIAGNOSIS — Z09 Encounter for follow-up examination after completed treatment for conditions other than malignant neoplasm: Secondary | ICD-10-CM

## 2024-02-25 DIAGNOSIS — Z9013 Acquired absence of bilateral breasts and nipples: Secondary | ICD-10-CM | POA: Diagnosis not present

## 2024-02-25 NOTE — Progress Notes (Signed)
" ° °  Established Patient Office Visit  Subjective   Patient ID: Tonya Andrews, female    DOB: 1975-10-16  Age: 49 y.o. MRN: [id number]  Chief Complaint  Patient presents with   Follow-up    HPI 49 yo F with history of breast cancer who underwent bilateral mastectomies followed by subpectoral tissue expander reconstruction on 10/01/2023.  Patient has completed chemotherapy and feels much better after completing it.  Incisions well-healed.  Expanders are back in normal position.  Patient has thought about our previous discussions and has decided that she wants to pursue autologous reconstruction.    Objective:     There were no vitals taken for this visit.   Physical Exam MA as chaperone Incisions well-healed on bilateral breasts.  Skin intact.  No signs of infection.  Both expanders in appropriate position.  The ASCVD Risk score (Arnett DK, et al., 2019) failed to calculate for the following reasons:   Risk score cannot be calculated because patient has a medical history suggesting prior/existing ASCVD   * - Cholesterol units were assumed    Assessment & Plan:   Problem List Items Addressed This Visit       Other   Breast cancer [facility])   Other Visit Diagnoses       Follow-up exam    -  Primary     History of breast cancer         S/P breast reconstruction          Patient is interested in autologous tissue reconstruction.  We discussed bilateral DIEP flaps 5 to 6 months after completion of chemotherapy.  All questions answered to patient's satisfaction.  We will plan to get a CTA of the abdomen and pelvis to map the deep inferior epigastric perforators.  We will also book the case for May.  Patient will follow-up with me in March, or earlier if any questions or concerns.   Kaz Auld M Remie Mathison, MD  "

## 2024-02-29 ENCOUNTER — Institutional Professional Consult (permissible substitution) (INDEPENDENT_AMBULATORY_CARE_PROVIDER_SITE_OTHER): Payer: Self-pay | Admitting: Otolaryngology

## 2024-03-01 ENCOUNTER — Ambulatory Visit: Admitting: Rehabilitation

## 2024-03-01 ENCOUNTER — Encounter: Payer: Self-pay | Admitting: Rehabilitation

## 2024-03-01 DIAGNOSIS — M79602 Pain in left arm: Secondary | ICD-10-CM

## 2024-03-01 DIAGNOSIS — C50912 Malignant neoplasm of unspecified site of left female breast: Secondary | ICD-10-CM

## 2024-03-01 DIAGNOSIS — Z9189 Other specified personal risk factors, not elsewhere classified: Secondary | ICD-10-CM

## 2024-03-01 DIAGNOSIS — Z483 Aftercare following surgery for neoplasm: Secondary | ICD-10-CM | POA: Diagnosis not present

## 2024-03-01 DIAGNOSIS — R293 Abnormal posture: Secondary | ICD-10-CM

## 2024-03-01 DIAGNOSIS — M79601 Pain in right arm: Secondary | ICD-10-CM

## 2024-03-01 NOTE — Patient Instructions (Signed)
 Deep Effective Breath   1.)  Standing, sitting, or laying down, place both hands on the belly. Take a deep breath IN, expanding the belly; then breath OUT, contracting the belly. Repeat __5__ times. Do __2-3__ sessions per day and before your self massage.  http://gt2.exer.us /866   Copyright  VHI. All rights reserved.  Axilla to Axilla - Sweep   2.) On uninvolved side make 5 circles in the both armpits  3.)  then pump _5__ times from involved armpit across chest to uninvolved armpit, making a pathway.   Copyright  VHI. All rights reserved.  Arm Posterior: Elbow to Shoulder - Sweep   4.) Pump _5__ times from back of elbow to top of shoulder.   5.) Then inner to outer upper arm _5_ times, then outer arm again _5_ times.   ARM: Volar Wrist to Elbow - Sweep   6.) Pump or stationary circles _5__ times from wrist to elbow making sure to do both sides of the forearm.   Copyright  VHI. All rights reserved.  ARM: Dorsum of Hand to Shoulder - Sweep   7.) Pump or stationary circles _5__ times on back of hand including knuckle spaces and individual fingers if needed working up towards the wrist,   8.)then retrace all your steps working back up the forearm, doing both sides; upper outer arm and back to your pathways _2-3_ times each. Then do 5 circles again at uninvolved armpit and involved groin where you started! Good job!! Do __1_ time per day.  Copyright  VHI. All rights reserved.

## 2024-03-01 NOTE — Therapy (Signed)
 " OUTPATIENT PHYSICAL THERAPY  UPPER EXTREMITY ONCOLOGY TREATMENT  Patient Name: Tonya Andrews MRN: [id number] DOB:1975-03-20, 49 y.o., female Today's Date: 03/01/2024  END OF SESSION:  PT End of Session - 03/01/24 0956     Visit Number 4    Number of Visits 9    Authorization Type 12 visits 02/09/24-05/08/24    Authorization - Visit Number 4    Authorization - Number of Visits 12    PT Start Time 1000    PT Stop Time 1053    PT Time Calculation (min) 53 min    Activity Tolerance Patient tolerated treatment well    Behavior During Therapy [facility] for tasks assessed/performed           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   Celiac disease    Fibromyalgia    Gallstones    GERD (gastroesophageal reflux disease)    Migraine headache    Dr Oneita   Nasal polyp    Nerve pain    PFO (patent foramen ovale) 04/2009   by bubble study/TEE, Dr Wonda, cardiology   PONV (postoperative nausea and vomiting)    Seasonal allergies    Stroke (HCC) 04/2009, 07/2009   Dr Rosemarie right arm weakness   Past Surgical History:  Procedure Laterality Date   BREAST BIOPSY Left 08/19/2023   US  LT BREAST BX W LOC DEV 1ST LESION IMG BX SPEC US  GUIDE 08/19/2023 [facility] MAMMOGRAPHY   CHOLECYSTECTOMY N/A 05/30/2019   Procedure: LAPAROSCOPIC CHOLECYSTECTOMY;  Surgeon: Tanda Locus, MD;  Location: [facility];  Service: General;  Laterality: N/A;   HARDWARE REMOVAL Right 06/15/2023   Procedure: REMOVAL, HARDWARE;  Surgeon: Sheril Coy, MD;  Location: [facility];  Service: Orthopedics;  Laterality: Right;   KNEE SURGERY Right    x2   PATENT FORAMEN OVALE(PFO) CLOSURE  09/2009   transcath closure    PORT-A-CATH REMOVAL Right 02/01/2024   Procedure: REMOVAL PORT-A-CATH;  Surgeon: Vernetta Berg, MD;  Location: [facility];  Service: General;  Laterality: Right;   PORTACATH PLACEMENT N/A 10/01/2023   Procedure: INSERTION, TUNNELED CENTRAL VENOUS  DEVICE, WITH PORT;  Surgeon: Vernetta Berg, MD;  Location: [facility];  Service: General;  Laterality: N/A;  PORT-A-CATH INSERTION WITH ULTRASOUND GUIDANCE   SENTINEL NODE BIOPSY Left 10/01/2023   Procedure: BIOPSY, LYMPH NODE, SENTINEL;  Surgeon: Vernetta Berg, MD;  Location: [facility];  Service: General;  Laterality: Left;   TISSUE EXPANDER PLACEMENT Bilateral 10/01/2023   Procedure: INSERTION, TISSUE EXPANDER;  Surgeon: Montorfano, Lisandro M, MD;  Location: [facility];  Service: Plastics;  Laterality: Bilateral;  WITH ACELLULAR DERMAL MATRIX   TOTAL KNEE ARTHROPLASTY Right 06/15/2023   Procedure: ARTHROPLASTY, KNEE, TOTAL;  Surgeon: Sheril Coy, MD;  Location: [facility];  Service: Orthopedics;  Laterality: Right;   TOTAL MASTECTOMY Bilateral 10/01/2023   Procedure: MASTECTOMY, SIMPLE;  Surgeon: Vernetta Berg, MD;  Location: [facility];  Service: General;  Laterality: Bilateral;   WISDOM TOOTH EXTRACTION     Patient Active Problem List   Diagnosis Date Noted   Breast cancer (HCC) 10/01/2023   Hospice care patient 09/17/2023   Genetic testing 09/17/2023   Malignant neoplasm of upper-inner quadrant of left breast in female, estrogen receptor negative (HCC) 09/01/2023   Primary localized osteoarthritis of right knee 06/15/2023   Gastrointestinal complaints 02/25/2022   Dietary counseling and surveillance 02/25/2022   Gastroesophageal  reflux disease 02/25/2022   Other allergic rhinitis 02/25/2022   Reactive airway disease 02/25/2022   Excessive daytime sleepiness 09/17/2021   Cholelithiasis without obstruction 08/18/2021   Fibromyalgia 08/18/2021   Paresthesia of arm 08/18/2021   Twitching 08/18/2021   Vitamin B12 deficiency (non anemic) 08/18/2021   Cerebrovascular accident (HCC) 04/18/2020   PFO (patent foramen ovale) 05/03/2009   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   History of cardioembolic cerebrovascular  accident (CVA) 05/02/2009   Migraine 05/02/2009    PCP: Reena Duck, MD  REFERRING PROVIDER: Mackey Chad, MD  REFERRING DIAG: Left breast cancer / cording  THERAPY DIAG:  Aftercare following surgery for neoplasm  Invasive ductal carcinoma of left breast (HCC)  Abnormal posture  At risk for lymphedema  Pain in left arm  Pain in right arm  ONSET DATE: 08/22/23  Rationale for Evaluation and Treatment: Rehabilitation  SUBJECTIVE:                                                                                                                                                                                           SUBJECTIVE STATEMENT:    It hurts every time I move the hand the forearm still hurts   PERTINENT HISTORY: Post bilateral mastectomy on 10/01/23 with expander placement. 3 negative nodes removed on the Lt.  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 completed chemotherapy 11/09/23. Rt TKR 06/27/23. Hx of 2 strokes with Rt sided weakness. Hx of hole in the heart. Fibromyalgia. No radiation needed.    PAIN:  Are you having pain? Yes, only when I reach and stretch my wrist  NPRS scale: 2/10 at worst  Pain location: from thumb to axilla  Pain orientation: Left  PAIN TYPE: aching and burning Pain description: intermittent  Aggravating factors: reaching  Relieving factors: advil    PRECAUTIONS: Left lymphedema risk   RED FLAGS: None   WEIGHT BEARING RESTRICTIONS: No  FALLS:  Has patient fallen in last 6 months? No  LIVING ENVIRONMENT: Lives with: lives with their family and lives with their spouse and 2 kids, 1 at college  OCCUPATION: works at a preschool and goes back in Aug  LEISURE: walking  HAND DOMINANCE: right   PRIOR LEVEL OF FUNCTION: Independent  PATIENT GOALS: get rid of cording pain   OBJECTIVE: Note: Objective measures were completed at Evaluation unless otherwise noted.  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    POSTURE: WNL  UPPER EXTREMITY AROM/PROM:  A/PROM RIGHT   eval   Shoulder extension   Shoulder flexion   Shoulder abduction   Shoulder internal rotation   Shoulder external rotation     (Blank rows = not tested)  A/PROM LEFT   eval 02/09/24  Shoulder extension 67 63 - pull   Shoulder flexion 160 165 - pull in elbow  Shoulder abduction 169 170 - pull  Shoulder internal rotation    Shoulder external rotation 110 100 - pull    (Blank rows = not tested)  CERVICAL AROM: All within normal limits:   L-DEX LYMPHEDEMA SCREENING:  QUICK DASH SURVEY: 38% limited from 25%                                                                                                                            TREATMENT DATE:  03/01/24 Supine on table:  prolonged chest stretch x , snow angel x 5,  alternating flexion x 5 bil, D2 diagonals, wrist extensor stretch 2x20  SOZO and discussion of results  Edu on self MLD with all steps performed in seated with good performance by pt by the end.  Omitted inguinal pathway  Release to left UE cords with arm resting on pillow at approx 120 degrees into scaption. Cord was more visible at the wrist near the thumb today so release was focused here.  PROM left shoulder with scapular depression for flex, scaption, abd, ER MFR along the arm at around 45deg abduction Then MLD to the Lt UE omitting inguinal pathway today  02/22/24 Release to left UE cords with arm resting on pillow at approx 120 degrees into scaption. No pain today with positioning  Cupping with smallest cup to left thumb and web space and with slightly larger clear cup to forearm and upper arm. PROM left shoulder with scapular depression for  flex, scaption, abd, ER MFR along the arm at around 45deg abduction Then MLD to the Lt UE omitting inguinal pathway today  02/15/2023 Showed wall slide stretch for cords, wrist extension for cording and trying different positions. Importance of stretching gently, and not over doing it too much. Release to left UE cords with arm resting on pillow at approx 120 degrees into scaption. Pt could feel appropriate tightness With repositioning arm pt had shoulder pain and was unable to position well to work on cords without shoulder pain. Placed arm on pillow at approx 90 deg. Abd and applied cocoa butter to hand, forearm and upper arm. Cupping with smallest cup to left thumb and web space and with slightly larger clear cup to forearm and upper arm. PROM left shoulder with scapular depression for flex, scaption, abd, ER Checked for size 3 gauntlet but we didn't have one. Tried a size 4 but too big. Pt had checked [employer] and will order from there or compression [employer]. Explained to pt how she can try  to stretch the present gauntlet she has  02/09/24: Eval performed Issued pt a size 3 black medi harmony sleeve and size 2 gauntlet due to increased SOZO and thumb pain from cording Discussed wear, washing, and not sleeping in the garment and donning Supine PROM Lt shoulder into flexion, abd, D2  MFR to axilla and upper arm with edu on cording and lymphedema Edu on self circles in axilla, elbow    PATIENT EDUCATION:  Education details: per today's note Person educated: Patient and Child(ren) Education method: Explanation, Demonstration, Tactile cues, and Verbal cues Education comprehension: verbalized understanding  HOME EXERCISE PROGRAM: Wear compression, circles in axilla and elbow, continue with stretches  ASSESSMENT:  CLINICAL IMPRESSION: Pt has now returned SOZO to the green but is very close to the yellow so she will continue with the compression.  Taught MLD fully today with demo and written  instruction.  The cord was noted more at the wrist which was good due to the fact that it had been hard to find in this region up until now.  Focused release here.  Scheduled out 4 more weeks.   EVAL Patient is a 50 y.o. female who was seen today for physical therapy evaluation and treatment for new onset of Lt UE cording and increasing SOZO score.  Pt unaware of any reason for onset so this may be chemo related.  She was given a sleeve to starting wearing and a gauntlet was included because a lot of her pain is in the thumb from the cording.  Then started MFR to the Lt arm which was tolerated well. Pt will do PT visits up until the 1 month SOZO recheck.    OBJECTIVE IMPAIRMENTS: decreased activity tolerance, decreased knowledge of condition, and decreased knowledge of use of DME. , edema, pain, decreased range of motion  ACTIVITY LIMITATIONS: carrying and lifting  PARTICIPATION LIMITATIONS: cleaning, shopping, and community activity  PERSONAL FACTORS: 1 comorbidity: SLNB are also affecting patient's functional outcome.   REHAB POTENTIAL: Excellent  CLINICAL DECISION MAKING: Stable/uncomplicated  EVALUATION COMPLEXITY: Low  GOALS: Goals reviewed with patient? Yes  SHORT TERM GOALS: Target date: 02/09/24  Pt will be educated on compression donning, care, and use to reduce risk of BCRL Baseline: Goal status: MET   LONG TERM GOALS: Target date: 03/09/24  Pt will return to no pulling in the upper arm with Lt shoulder AROM Baseline:  Goal status: INITIAL  2.  Pt will be ind with self MLD and use of compression for the Lt UE to manage her stage 0 lymphedema  Baseline:  Goal status: INITIAL  3.  Pt will be ind with final HEP Baseline:  Goal status: INITIAL   PLAN:  PT FREQUENCY: 1-2x/week  PT DURATION: 4 weeks  PLANNED INTERVENTIONS: 97164- PT Re-evaluation, 97110-Therapeutic exercises, 97530- Therapeutic activity, 97112- Neuromuscular re-education, 97535- Self Care, [id number]-  Manual therapy, [phone]- Orthotic Initial, [phone]- Prosthetic Initial , Patient/Family education, Balance training, Joint mobilization, Therapeutic exercises, Therapeutic activity, Neuromuscular re-education, Gait training, and Self Care  PLAN FOR NEXT SESSION: *Do SOZO around 2/25c ,  Lt UE cording, teach MLD and include elbow. Larue Saddie SAUNDERS, PT 03/01/2024, 12:23 PM  "

## 2024-03-08 ENCOUNTER — Encounter: Payer: Self-pay | Admitting: Rehabilitation

## 2024-03-08 ENCOUNTER — Ambulatory Visit: Attending: Surgery | Admitting: Rehabilitation

## 2024-03-08 DIAGNOSIS — Z483 Aftercare following surgery for neoplasm: Secondary | ICD-10-CM

## 2024-03-08 DIAGNOSIS — Z9189 Other specified personal risk factors, not elsewhere classified: Secondary | ICD-10-CM

## 2024-03-08 DIAGNOSIS — C50912 Malignant neoplasm of unspecified site of left female breast: Secondary | ICD-10-CM

## 2024-03-08 DIAGNOSIS — M79602 Pain in left arm: Secondary | ICD-10-CM

## 2024-03-08 DIAGNOSIS — R293 Abnormal posture: Secondary | ICD-10-CM

## 2024-03-08 NOTE — Therapy (Signed)
 " OUTPATIENT PHYSICAL THERAPY  UPPER EXTREMITY ONCOLOGY TREATMENT  Patient Name: Tonya Andrews MRN: [id number] DOB:1975/03/26, 49 y.o., female Today's Date: 03/08/2024  END OF SESSION:  PT End of Session - 03/08/24 1057     Visit Number 5    Number of Visits 9    Authorization Type 12 visits 02/09/24-05/08/24    Authorization - Visit Number 5    Authorization - Number of Visits 12    PT Start Time 1005    PT Stop Time 1056    PT Time Calculation (min) 51 min    Activity Tolerance Patient tolerated treatment well    Behavior During Therapy [facility] for tasks assessed/performed            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   Celiac disease    Fibromyalgia    Gallstones    GERD (gastroesophageal reflux disease)    Migraine headache    Dr Oneita   Nasal polyp    Nerve pain    PFO (patent foramen ovale) 04/2009   by bubble study/TEE, Dr Wonda, cardiology   PONV (postoperative nausea and vomiting)    Seasonal allergies    Stroke (HCC) 04/2009, 07/2009   Dr Rosemarie right arm weakness   Past Surgical History:  Procedure Laterality Date   BREAST BIOPSY Left 08/19/2023   US  LT BREAST BX W LOC DEV 1ST LESION IMG BX SPEC US  GUIDE 08/19/2023 [facility] MAMMOGRAPHY   CHOLECYSTECTOMY N/A 05/30/2019   Procedure: LAPAROSCOPIC CHOLECYSTECTOMY;  Surgeon: Tanda Locus, MD;  Location: [facility];  Service: General;  Laterality: N/A;   HARDWARE REMOVAL Right 06/15/2023   Procedure: REMOVAL, HARDWARE;  Surgeon: Sheril Coy, MD;  Location: [facility];  Service: Orthopedics;  Laterality: Right;   KNEE SURGERY Right    x2   PATENT FORAMEN OVALE(PFO) CLOSURE  09/2009   transcath closure    PORT-A-CATH REMOVAL Right 02/01/2024   Procedure: REMOVAL PORT-A-CATH;  Surgeon: Vernetta Berg, MD;  Location: [facility];  Service: General;  Laterality: Right;   PORTACATH PLACEMENT N/A 10/01/2023   Procedure: INSERTION, TUNNELED CENTRAL VENOUS  DEVICE, WITH PORT;  Surgeon: Vernetta Berg, MD;  Location: [facility];  Service: General;  Laterality: N/A;  PORT-A-CATH INSERTION WITH ULTRASOUND GUIDANCE   SENTINEL NODE BIOPSY Left 10/01/2023   Procedure: BIOPSY, LYMPH NODE, SENTINEL;  Surgeon: Vernetta Berg, MD;  Location: [facility];  Service: General;  Laterality: Left;   TISSUE EXPANDER PLACEMENT Bilateral 10/01/2023   Procedure: INSERTION, TISSUE EXPANDER;  Surgeon: Montorfano, Lisandro M, MD;  Location: [facility];  Service: Plastics;  Laterality: Bilateral;  WITH ACELLULAR DERMAL MATRIX   TOTAL KNEE ARTHROPLASTY Right 06/15/2023   Procedure: ARTHROPLASTY, KNEE, TOTAL;  Surgeon: Sheril Coy, MD;  Location: [facility];  Service: Orthopedics;  Laterality: Right;   TOTAL MASTECTOMY Bilateral 10/01/2023   Procedure: MASTECTOMY, SIMPLE;  Surgeon: Vernetta Berg, MD;  Location: [facility];  Service: General;  Laterality: Bilateral;   WISDOM TOOTH EXTRACTION     Patient Active Problem List   Diagnosis Date Noted   Breast cancer (HCC) 10/01/2023   Hospice care patient 09/17/2023   Genetic testing 09/17/2023   Malignant neoplasm of upper-inner quadrant of left breast in female, estrogen receptor negative (HCC) 09/01/2023   Primary localized osteoarthritis of right knee 06/15/2023   Gastrointestinal complaints 02/25/2022   Dietary counseling and surveillance 02/25/2022  Gastroesophageal reflux disease 02/25/2022   Other allergic rhinitis 02/25/2022   Reactive airway disease 02/25/2022   Excessive daytime sleepiness 09/17/2021   Cholelithiasis without obstruction 08/18/2021   Fibromyalgia 08/18/2021   Paresthesia of arm 08/18/2021   Twitching 08/18/2021   Vitamin B12 deficiency (non anemic) 08/18/2021   Cerebrovascular accident (HCC) 04/18/2020   PFO (patent foramen ovale) 05/03/2009   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   History of cardioembolic cerebrovascular  accident (CVA) 05/02/2009   Migraine 05/02/2009    PCP: Reena Duck, MD  REFERRING PROVIDER: Mackey Chad, MD  REFERRING DIAG: Left breast cancer / cording  THERAPY DIAG:  Aftercare following surgery for neoplasm  Invasive ductal carcinoma of left breast (HCC)  Abnormal posture  At risk for lymphedema  Pain in left arm  ONSET DATE: 08/22/23  Rationale for Evaluation and Treatment: Rehabilitation  SUBJECTIVE:                                                                                                                                                                                           SUBJECTIVE STATEMENT:    The wrist is a bit better.  It feels a bit worse higher up.    PERTINENT HISTORY: Post bilateral mastectomy on 10/01/23 with expander placement. 3 negative nodes removed on the Lt.  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 completed chemotherapy 11/09/23. Rt TKR 06/27/23. Hx of 2 strokes with Rt sided weakness. Hx of hole in the heart. Fibromyalgia. No radiation needed.    PAIN:  Are you having pain? Yes, only when I reach and stretch my wrist  NPRS scale: 2/10 at worst  Pain location: from thumb to axilla  Pain orientation: Left  PAIN TYPE: aching and burning Pain description: intermittent  Aggravating factors: reaching  Relieving factors: advil    PRECAUTIONS: Left lymphedema risk   RED FLAGS: None   WEIGHT BEARING RESTRICTIONS: No  FALLS:  Has patient fallen in last 6 months? No  LIVING ENVIRONMENT: Lives with: lives with their family and lives with their spouse and 2 kids, 1 at college  OCCUPATION: works at a preschool and goes back in Aug  LEISURE: walking  HAND DOMINANCE: right   PRIOR LEVEL OF FUNCTION: Independent  PATIENT GOALS: get rid of cording pain   OBJECTIVE: Note: Objective measures were completed at Evaluation unless otherwise noted.  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    POSTURE: WNL  UPPER EXTREMITY AROM/PROM:  A/PROM RIGHT   eval   Shoulder extension   Shoulder flexion   Shoulder abduction   Shoulder internal rotation   Shoulder external rotation     (Blank rows = not tested)  A/PROM LEFT   eval 02/09/24 03/08/24  Shoulder extension 67 63 - pull    Shoulder flexion 160 165 - pull in elbow 165 - pull in axilla  Shoulder abduction 169 170 - pull 170 - pull in axilla   Shoulder internal rotation     Shoulder external rotation 110 100 - pull 105    (Blank rows = not tested)  CERVICAL AROM: All within normal limits:   L-DEX LYMPHEDEMA SCREENING:  QUICK DASH SURVEY: 38% limited from 25%                                                                                                                            TREATMENT DATE:  03/08/24 Supine on table with 1/2 foam roll:  prolonged chest stretch x , snow angel x 10, dowel flexion with 3# x 10 alternating flexion x 5 bil, wrist extensor stretch 2x20  Release to left UE cords with arm resting on pillow at approx 120 degrees into scaption. Cord was more visible at the wrist near the thumb today so release was focused here.  PROM left shoulder with scapular depression for flex, scaption, abd, ER MFR along the arm at around 45deg abduction Then MLD to the Lt UE omitting inguinal pathway today No questions on self MLD, stretches to continue and when to return  03/01/24 Supine on table:  prolonged chest stretch x , snow angel x 5,  alternating flexion x 5 bil, D2 diagonals, wrist extensor stretch 2x20  SOZO and discussion of results  Edu on self MLD with all steps performed in seated with good performance by pt by the end.  Omitted inguinal pathway  Release to left UE  cords with arm resting on pillow at approx 120 degrees into scaption. Cord was more visible at the wrist near the thumb today so release was focused here.  PROM left shoulder with scapular depression for flex, scaption, abd, ER MFR along the arm at around 45deg abduction Then MLD to the Lt UE omitting inguinal pathway today  02/22/24 Release to left UE cords with arm resting on pillow at approx 120 degrees into scaption. No pain today with positioning  Cupping with smallest cup to left thumb and web space and with slightly larger clear cup to forearm and upper arm. PROM left shoulder with scapular depression for flex, scaption, abd, ER MFR along the arm at around 45deg abduction Then MLD to the Lt UE omitting inguinal pathway today  PATIENT EDUCATION:  Education details: per today's note Person educated: Patient and Child(ren) Education method: Explanation, Demonstration, Tactile cues, and Verbal cues Education comprehension: verbalized understanding  HOME EXERCISE PROGRAM: Wear compression,  circles in axilla and elbow, continue with stretches  ASSESSMENT:  CLINICAL IMPRESSION: Pt has now returned SOZO to the green and is feeling like her ROM is back to normal with intermittent pull in the axilla and upper arm, but improves with continued stretching at home.  She will attempt DC today and will continue with SOZO screenings.  Discussed that the new discomfort in the upper arm may be from where the sleeve is stopping a bit low so she will try to keep it up higher.   EVAL Patient is a 49 y.o. female who was seen today for physical therapy evaluation and treatment for new onset of Lt UE cording and increasing SOZO score.  Pt unaware of any reason for onset so this may be chemo related.  She was given a sleeve to starting wearing and a gauntlet was included because a lot of her pain is in the thumb from the cording.  Then started MFR to the Lt arm which was tolerated well. Pt will do PT visits  up until the 1 month SOZO recheck.    OBJECTIVE IMPAIRMENTS: decreased activity tolerance, decreased knowledge of condition, and decreased knowledge of use of DME. , edema, pain, decreased range of motion  ACTIVITY LIMITATIONS: carrying and lifting  PARTICIPATION LIMITATIONS: cleaning, shopping, and community activity  PERSONAL FACTORS: 1 comorbidity: SLNB are also affecting patient's functional outcome.   REHAB POTENTIAL: Excellent  CLINICAL DECISION MAKING: Stable/uncomplicated  EVALUATION COMPLEXITY: Low  GOALS: Goals reviewed with patient? Yes  SHORT TERM GOALS: Target date: 02/09/24  Pt will be educated on compression donning, care, and use to reduce risk of BCRL Baseline: Goal status: MET   LONG TERM GOALS: Target date: 03/09/24  Pt will return to no pulling in the upper arm with Lt shoulder AROM Baseline:  Goal status: PARTIALLY MET  2.  Pt will be ind with self MLD and use of compression for the Lt UE to manage her stage 0 lymphedema  Baseline:  Goal status: MET  3.  Pt will be ind with final HEP Baseline:  Goal status: MET   PLAN:  PT FREQUENCY: 1-2x/week  PT DURATION: 4 weeks  PLANNED INTERVENTIONS: 97164- PT Re-evaluation, 97110-Therapeutic exercises, 97530- Therapeutic activity, 97112- Neuromuscular re-education, 97535- Self Care, [id number]- Manual therapy, [phone]- Orthotic Initial, [phone]- Prosthetic Initial , Patient/Family education, Balance training, Joint mobilization, Therapeutic exercises, Therapeutic activity, Neuromuscular re-education, Gait training, and Self Care  PLAN FOR NEXT SESSION: *Do SOZO around 2/25c ,  Lt UE cording, teach MLD and include elbow. Larue Saddie SAUNDERS, PT 03/08/2024, 10:58 AM  PHYSICAL THERAPY DISCHARGE SUMMARY  Visits from Start of Care: 5  Current functional level related to goals / functional outcomes: See above   Remaining deficits: Lymphedema risk, cording with intermittent tightness   Education / Equipment: Final HEP  and self care  Plan: Patient agrees to discharge.  Patient is being discharged due to meeting the stated rehab goals.     Saddie Larue, PT 03/08/24, 11:02 AM     "

## 2024-03-14 ENCOUNTER — Ambulatory Visit: Admitting: Podiatry

## 2024-03-15 ENCOUNTER — Ambulatory Visit (HOSPITAL_COMMUNITY)

## 2024-04-05 ENCOUNTER — Ambulatory Visit

## 2024-04-10 ENCOUNTER — Ambulatory Visit: Attending: Surgery

## 2024-04-19 ENCOUNTER — Inpatient Hospital Stay: Admitting: Adult Health

## 2024-04-19 ENCOUNTER — Inpatient Hospital Stay
